# Patient Record
Sex: Female | Born: 1945 | Race: White | Hispanic: No | Marital: Married | Smoking: Former smoker
Health system: Southern US, Community
[De-identification: ages and names within clinical notes are randomized; demographics above are authoritative.]

## PROBLEM LIST (undated history)

## (undated) DIAGNOSIS — G4730 Sleep apnea, unspecified: Secondary | ICD-10-CM

## (undated) DIAGNOSIS — M1990 Unspecified osteoarthritis, unspecified site: Secondary | ICD-10-CM

## (undated) DIAGNOSIS — T8859XA Other complications of anesthesia, initial encounter: Secondary | ICD-10-CM

## (undated) DIAGNOSIS — R519 Headache, unspecified: Secondary | ICD-10-CM

## (undated) DIAGNOSIS — E039 Hypothyroidism, unspecified: Secondary | ICD-10-CM

## (undated) DIAGNOSIS — J329 Chronic sinusitis, unspecified: Secondary | ICD-10-CM

## (undated) DIAGNOSIS — G5603 Carpal tunnel syndrome, bilateral upper limbs: Secondary | ICD-10-CM

## (undated) DIAGNOSIS — M722 Plantar fascial fibromatosis: Secondary | ICD-10-CM

## (undated) DIAGNOSIS — M2170 Unequal limb length (acquired), unspecified site: Secondary | ICD-10-CM

## (undated) DIAGNOSIS — T7840XA Allergy, unspecified, initial encounter: Secondary | ICD-10-CM

## (undated) DIAGNOSIS — E785 Hyperlipidemia, unspecified: Secondary | ICD-10-CM

## (undated) DIAGNOSIS — K5909 Other constipation: Secondary | ICD-10-CM

## (undated) DIAGNOSIS — K219 Gastro-esophageal reflux disease without esophagitis: Secondary | ICD-10-CM

## (undated) DIAGNOSIS — G8929 Other chronic pain: Secondary | ICD-10-CM

## (undated) DIAGNOSIS — C801 Malignant (primary) neoplasm, unspecified: Secondary | ICD-10-CM

## (undated) DIAGNOSIS — F419 Anxiety disorder, unspecified: Secondary | ICD-10-CM

## (undated) DIAGNOSIS — H409 Unspecified glaucoma: Secondary | ICD-10-CM

## (undated) DIAGNOSIS — L718 Other rosacea: Secondary | ICD-10-CM

## (undated) DIAGNOSIS — H269 Unspecified cataract: Secondary | ICD-10-CM

## (undated) DIAGNOSIS — R51 Headache: Secondary | ICD-10-CM

## (undated) DIAGNOSIS — L723 Sebaceous cyst: Secondary | ICD-10-CM

## (undated) DIAGNOSIS — J309 Allergic rhinitis, unspecified: Secondary | ICD-10-CM

## (undated) DIAGNOSIS — E559 Vitamin D deficiency, unspecified: Secondary | ICD-10-CM

## (undated) DIAGNOSIS — IMO0002 Reserved for concepts with insufficient information to code with codable children: Secondary | ICD-10-CM

## (undated) HISTORY — DX: Reserved for concepts with insufficient information to code with codable children: IMO0002

## (undated) HISTORY — DX: Other rosacea: L71.8

## (undated) HISTORY — DX: Gastro-esophageal reflux disease without esophagitis: K21.9

## (undated) HISTORY — DX: Hyperlipidemia, unspecified: E78.5

## (undated) HISTORY — DX: Unspecified cataract: H26.9

## (undated) HISTORY — DX: Allergic rhinitis, unspecified: J30.9

## (undated) HISTORY — DX: Chronic sinusitis, unspecified: J32.9

## (undated) HISTORY — DX: Unspecified glaucoma: H40.9

## (undated) HISTORY — DX: Sebaceous cyst: L72.3

## (undated) HISTORY — DX: Vitamin D deficiency, unspecified: E55.9

## (undated) HISTORY — DX: Plantar fascial fibromatosis: M72.2

## (undated) HISTORY — DX: Allergy, unspecified, initial encounter: T78.40XA

## (undated) HISTORY — DX: Carpal tunnel syndrome, bilateral upper limbs: G56.03

## (undated) HISTORY — DX: Hypothyroidism, unspecified: E03.9

## (undated) HISTORY — PX: TUBAL LIGATION: SHX77

## (undated) HISTORY — PX: BREAST SURGERY: SHX581

## (undated) HISTORY — PX: SEPTOPLASTY: SUR1290

## (undated) HISTORY — DX: Unequal limb length (acquired), unspecified site: M21.70

## (undated) HISTORY — DX: Other constipation: K59.09

## (undated) HISTORY — DX: Sleep apnea, unspecified: G47.30

## (undated) HISTORY — DX: Anxiety disorder, unspecified: F41.9

## (undated) HISTORY — DX: Other chronic pain: G89.29

## (undated) HISTORY — DX: Headache: R51

## (undated) HISTORY — PX: EYE SURGERY: SHX253

## (undated) HISTORY — DX: Headache, unspecified: R51.9

## (undated) HISTORY — PX: GANGLION CYST EXCISION: SHX1691

## (undated) HISTORY — PX: FACIAL COSMETIC SURGERY: SHX629

## (undated) HISTORY — PX: RHINOPLASTY: SUR1284

## (undated) HISTORY — PX: OTHER SURGICAL HISTORY: SHX169

## (undated) HISTORY — PX: MENISCUS REPAIR: SHX5179

---

## 1981-05-24 HISTORY — PX: ABDOMINAL HYSTERECTOMY: SHX81

## 2008-02-12 DIAGNOSIS — IMO0002 Reserved for concepts with insufficient information to code with codable children: Secondary | ICD-10-CM | POA: Insufficient documentation

## 2008-02-12 DIAGNOSIS — E7800 Pure hypercholesterolemia, unspecified: Secondary | ICD-10-CM | POA: Insufficient documentation

## 2008-02-15 DIAGNOSIS — M722 Plantar fascial fibromatosis: Secondary | ICD-10-CM | POA: Insufficient documentation

## 2008-02-15 DIAGNOSIS — M2170 Unequal limb length (acquired), unspecified site: Secondary | ICD-10-CM | POA: Insufficient documentation

## 2012-05-11 LAB — HM MAMMOGRAPHY

## 2012-10-06 ENCOUNTER — Encounter: Payer: Self-pay | Admitting: Pulmonary Disease

## 2012-10-06 ENCOUNTER — Ambulatory Visit (INDEPENDENT_AMBULATORY_CARE_PROVIDER_SITE_OTHER): Payer: Medicare Other | Admitting: Pulmonary Disease

## 2012-10-06 VITALS — BP 132/82 | HR 70 | Temp 97.3°F | Ht 66.5 in | Wt 210.0 lb

## 2012-10-06 DIAGNOSIS — G4733 Obstructive sleep apnea (adult) (pediatric): Secondary | ICD-10-CM | POA: Insufficient documentation

## 2012-10-06 NOTE — Progress Notes (Signed)
Subjective:    Patient ID: Carol Norton, female    DOB: May 05, 1946, 67 y.o.   MRN: [id number]  HPI The patient is a 67 year old female who I've been asked to see for management of obstructive sleep apnea.  She was diagnosed with significant sleep apnea in 2001 while living in [place], and has been on CPAP since that time.  She does not have a medical equipment company here in town, and is currently using a CPAP machine that is 66 years old.  She uses a nasal CPAP mask, but is unsure how old this is.  Overall, the patient feels that she is doing well on the CPAP, and feels rested in the mornings upon arising.  However, she feels that she is a "slow starter", but denies any issues with sleepiness during the day.  She has no issues trying to watch television or movies in the evening, and has no sleepiness with driving.  Her weight has increased about 25 pounds over the last 5 years, and her Epworth score today is 7.   Sleep Questionnaire What time do you typically go to bed?( Between what hours) [id number] 03-1129 at 1419 on 10/06/12 by Nita Sells, CMA How long does it take you to fall asleep? 10-24mins 10-51mins at 1419 on 10/06/12 by Nita Sells, CMA How many times during the night do you wake up? What time do you get out of bed to start your day? [id number] 7-830 at 1419 on 10/06/12 by Marjo Bicker Mabe, CMA Do you drive or operate heavy machinery in your occupation? No No at 1419 on 10/06/12 by Nita Sells, CMA How much has your weight changed (up or down) over the past two years? (In pounds) 25 lb (11.34 kg)25 lb (11.34 kg) increase at 1419 on 10/06/12 by Nita Sells, CMA Have you ever had a sleep study before? Yes Yes at 1419 on 10/06/12 by Nita Sells, CMA If yes, location of [place] at 1419 on 10/06/12 by Nita Sells, CMA If yes, date of study? 2001 2001 at 1419 on 10/06/12 by Nita Sells, CMA Do you currently use CPAP? Yes Yes at 1419 on  10/06/12 by Marjo Bicker Mabe, CMA If so, what pressure? 11/12 11/12 at 1419 on 10/06/12 by Marjo Bicker Mabe, CMA Do you wear oxygen at any time? No No at 1419 on 10/06/12 by Marjo Bicker Mabe, CMA   Review of Systems  Constitutional: Positive for unexpected weight change. Negative for fever.  HENT: Positive for congestion, sneezing and dental problem. Negative for ear pain, nosebleeds, sore throat, rhinorrhea, trouble swallowing, postnasal drip and sinus pressure.   Eyes: Negative for redness and itching.  Respiratory: Negative for cough, chest tightness, shortness of breath and wheezing.   Cardiovascular: Negative for palpitations and leg swelling.  Gastrointestinal: Negative for nausea and vomiting.       Acid heartburn   Genitourinary: Negative for dysuria.  Musculoskeletal: Positive for joint swelling and arthralgias.  Skin: Positive for rash ( itching).  Neurological: Negative for headaches.  Hematological: Does not bruise/bleed easily.  Psychiatric/Behavioral: Negative for dysphoric mood. The patient is not nervous/anxious.        Objective:   Physical Exam Constitutional:  Overweight female, no acute distress  HENT:  Nares patent without discharge, but deviated septum to left with swollen mucosa on left  Oropharynx without exudate, palate and uvula are normal  Eyes:  Perrla, eomi, no scleral icterus  Neck:  No JVD,  no TMG  Cardiovascular:  Normal rate, regular rhythm, no rubs or gallops.  No murmurs        Intact distal pulses  Pulmonary :  Normal breath sounds, no stridor or respiratory distress   No rales, rhonchi, or wheezing  Abdominal:  Soft, nondistended, bowel sounds present.  No tenderness noted.   Musculoskeletal:  No lower extremity edema noted.  Lymph Nodes:  No cervical lymphadenopathy noted  Skin:  No cyanosis noted  Neurologic:  Alert, appropriate, moves all 4 extremities without obvious deficit.         Assessment & Plan:

## 2012-10-06 NOTE — Patient Instructions (Addendum)
Will see if you qualify for a new cpap machine, and will also optimize your pressure on the automatic setting for a few weeks.  Will let you know your optimal pressure once download available. Work on weight loss followup with me in one year if doing well, but call if having problems.

## 2012-10-06 NOTE — Assessment & Plan Note (Signed)
The patient has a history of significant obstructive sleep apnea by her history, and has been on CPAP since 2001.  She has gained significant weight the last 5 years, and has not had her pressure at looked at since 2001.  Despite this, she feels that she sleeps fairly well, and is satisfied with her alertness during the day.  At this point, she is using a CPAP sheen from 2001, and I would like to get her up to date in terms of equipment.  I also think we need to optimize her pressure again since it has been quite some time and she has gained weight.  I have asked her to keep up with her mask changes and supplies, and we'll see her back in one year if doing well.

## 2012-10-26 ENCOUNTER — Telehealth: Payer: Self-pay | Admitting: Pulmonary Disease

## 2012-10-26 NOTE — Telephone Encounter (Signed)
Called, spoke with Selena Batten with [facility].  States pt's current cpap is set at an auto of 5-20.  Her old machine was set at 26.  Kim faxed over 2 wk download today.  States pt feels 5 is too low to start at.  Pt would like to know if this can be increased.  Kim aware KC out of office this evening and will return tomorrow.  KC, pls advise.  Thank you.

## 2012-10-27 ENCOUNTER — Encounter: Payer: Self-pay | Admitting: Pulmonary Disease

## 2012-10-27 ENCOUNTER — Other Ambulatory Visit: Payer: Self-pay | Admitting: Pulmonary Disease

## 2012-10-27 DIAGNOSIS — G4733 Obstructive sleep apnea (adult) (pediatric): Secondary | ICD-10-CM

## 2012-10-27 NOTE — Telephone Encounter (Signed)
I have placed download inside of your Green folder Dr. Shelle Iron. Thanks

## 2012-10-27 NOTE — Telephone Encounter (Signed)
[facility] is closed for the day.  WCB on Monday.

## 2012-10-27 NOTE — Telephone Encounter (Signed)
Please let pt know that her optimal pressure appears to be 13-14cm. Will send an order to DME to change.

## 2012-10-27 NOTE — Telephone Encounter (Signed)
Would like to see the 2 week download.  Where is it?

## 2012-10-30 NOTE — Telephone Encounter (Signed)
Order placed and sent to [facility]. Carron Curie, CMA

## 2013-12-12 ENCOUNTER — Encounter: Payer: Self-pay | Admitting: Internal Medicine

## 2013-12-12 ENCOUNTER — Other Ambulatory Visit: Payer: Self-pay | Admitting: Family Medicine

## 2013-12-12 DIAGNOSIS — M542 Cervicalgia: Secondary | ICD-10-CM

## 2013-12-14 ENCOUNTER — Ambulatory Visit
Admission: RE | Admit: 2013-12-14 | Discharge: 2013-12-14 | Disposition: A | Discharge status: Home / Self Care | Payer: Medicare Other | Source: Ambulatory Visit | Attending: Family Medicine | Admitting: Family Medicine

## 2013-12-14 DIAGNOSIS — M542 Cervicalgia: Secondary | ICD-10-CM

## 2014-02-12 ENCOUNTER — Encounter: Payer: Medicare Other | Admitting: Internal Medicine

## 2014-03-13 ENCOUNTER — Ambulatory Visit (INDEPENDENT_AMBULATORY_CARE_PROVIDER_SITE_OTHER): Payer: Medicare Other | Admitting: Pulmonary Disease

## 2014-03-13 ENCOUNTER — Encounter: Payer: Self-pay | Admitting: Pulmonary Disease

## 2014-03-13 ENCOUNTER — Encounter (INDEPENDENT_AMBULATORY_CARE_PROVIDER_SITE_OTHER): Payer: Self-pay

## 2014-03-13 VITALS — BP 142/60 | HR 84 | Temp 97.0°F | Ht 67.0 in | Wt 215.6 lb

## 2014-03-13 DIAGNOSIS — G4733 Obstructive sleep apnea (adult) (pediatric): Secondary | ICD-10-CM

## 2014-03-13 NOTE — Patient Instructions (Signed)
Continue on cpap, and we will send in an order to [facility] for the new "dreamware mask". Work on weight loss followup with me again in one year, but call if having issues with mask.

## 2014-03-13 NOTE — Progress Notes (Signed)
   Subjective:    Patient ID: Carol Norton, female    DOB: [birth year], 68 y.o.   MRN: [id number]  HPI Patient comes in today for followup of her obstructive sleep apnea. She is wearing CPAP compliantly, but is having issues with her skin being irritated by the CPAP mask. She sleeps well with the device, and has seen significant improvement in her daytime alertness since being on the CPAP.   Review of Systems  Constitutional: Negative for fever and unexpected weight change.  HENT: Negative for congestion, dental problem, ear pain, nosebleeds, postnasal drip, rhinorrhea, sinus pressure, sneezing, sore throat and trouble swallowing.   Eyes: Negative for redness and itching.  Respiratory: Negative for cough, chest tightness, shortness of breath and wheezing.   Cardiovascular: Negative for palpitations and leg swelling.  Gastrointestinal: Negative for nausea and vomiting.  Genitourinary: Negative for dysuria.  Musculoskeletal: Negative for joint swelling.  Skin: Negative for rash.  Neurological: Negative for headaches.  Hematological: Does not bruise/bleed easily.  Psychiatric/Behavioral: Negative for dysphoric mood. The patient is not nervous/anxious.        Objective:   Physical Exam Obese female in no acute distress Nose without purulence or discharge noted No skin breakdown or pressure necrosis from the CPAP mask Mild erythema that is patchy on her cheeks and over the bridge of her nose Neck without lymphadenopathy or thyromegaly Lower extremities with mild edema, no cyanosis Alert and oriented, moves all 4 extremities.       Assessment & Plan:

## 2014-03-13 NOTE — Assessment & Plan Note (Addendum)
The patient is wearing CPAP compliantly, but is having issues with her skin condition and the CPAP mask.  I have showed her a few different types of masks that are new, and she would like to try the dreamware.  This will minimize contact with her skin around her nose.  I have asked her to continue on CPAP, and to work aggressively on weight loss.

## 2014-03-15 ENCOUNTER — Telehealth: Payer: Self-pay | Admitting: Pulmonary Disease

## 2014-03-15 NOTE — Telephone Encounter (Signed)
Called [facility] and received after hrs answering service. I tried calling Carol's # and did not receive an answer. Will need to call back on Monday.  Called pt and made her aware of above. She also reports she has a hard time getting through [place] and keeps getting transferred to the call center.  Pt aware we will call her once we speak with [facility].

## 2014-03-18 NOTE — Telephone Encounter (Signed)
Called and spoke to pt. She stated she never has not received a call from [facility] about her mask. Called and spoke to [place] with [facility]. She stated the order was cancelled. Order reinstated. Lynn Ito stated they will call her to get the mask fitted. Called and spoke to pt. Advised her of the update and stated someone will be contacting her to get mask fitted. Pt verbalized understanding and denied any further questions or concerns at this time.

## 2014-04-01 ENCOUNTER — Telehealth: Payer: Self-pay | Admitting: Pulmonary Disease

## 2014-04-01 DIAGNOSIS — G4733 Obstructive sleep apnea (adult) (pediatric): Secondary | ICD-10-CM

## 2014-04-01 NOTE — Telephone Encounter (Signed)
Called spoke with pt. She had the nasal pillows and it causes too much air in the nose. She has a dreamware mask and it feels like a lot of pressure going up into her nose. She reports her nose is swollen and feels sore on the inside. She wants to get a download off her machine while using the dreamware mask to make sure the pressue is not too much. Huey Romans will need to send a chip to do so. Please advise [place] thanks

## 2014-04-01 NOTE — Telephone Encounter (Signed)
Called [facility]. Pt has an S-9 auto that she was set up in May 2014. She is not on airview Will send order to [facility] for download Called made pt aware of below. Nothing further needed

## 2014-04-01 NOTE — Telephone Encounter (Signed)
If it is a new machine, we should be able to get download from [place].  Please do this for the last 2 weeks.  If the mask is making her nose sore, she will need to try something else.

## 2014-05-06 ENCOUNTER — Telehealth: Payer: Self-pay | Admitting: Pulmonary Disease

## 2014-05-06 DIAGNOSIS — G4733 Obstructive sleep apnea (adult) (pediatric): Secondary | ICD-10-CM

## 2014-05-06 NOTE — Telephone Encounter (Signed)
[facility] PT line rang numerous times, no answer WCB 

## 2014-05-06 NOTE — Telephone Encounter (Signed)
Carol Norton, let pt know that her download shows good control of her sleep apnea, but is having a little more mask leak than she should.  Work with her home care company on a better fit. Thanks.

## 2014-05-08 NOTE — Telephone Encounter (Signed)
Called home #-line rang numerous times and no answer Called mobile #--Lmtcb X1

## 2014-05-09 NOTE — Telephone Encounter (Signed)
I spoke with patient about results and she verbalized understanding. Pt reports she has been trying to work with [facility] and even tried the mask Dr. Gwenette Greet recommended but she has a difficult time d/t her rosacea. Pt has contact issues with certain masks and the silicone. Pt reports she has not been over to the sleep center w/ [facility] yet. Please advise [place] thanks

## 2014-05-09 NOTE — Telephone Encounter (Signed)
Called and spoke to pt. Informed pt of the recs per KC. Order placed. Pt verbalized understanding and denied any further questions or concerns at this time.   

## 2014-05-09 NOTE — Telephone Encounter (Signed)
I think referral to sleep center for mask fitting is a good idea If not able to find anything to work, may have to consider a dental appliance instead of cpap.

## 2014-06-04 ENCOUNTER — Ambulatory Visit (HOSPITAL_BASED_OUTPATIENT_CLINIC_OR_DEPARTMENT_OTHER): Payer: 59 | Attending: Pulmonary Disease | Admitting: Radiology

## 2014-06-04 DIAGNOSIS — G4733 Obstructive sleep apnea (adult) (pediatric): Secondary | ICD-10-CM

## 2014-06-04 DIAGNOSIS — Z9989 Dependence on other enabling machines and devices: Principal | ICD-10-CM

## 2014-07-15 ENCOUNTER — Ambulatory Visit
Admission: RE | Admit: 2014-07-15 | Discharge: 2014-07-15 | Disposition: A | Discharge status: Home / Self Care | Payer: 59 | Source: Ambulatory Visit | Attending: Family Medicine | Admitting: Family Medicine

## 2014-07-15 ENCOUNTER — Other Ambulatory Visit: Payer: Self-pay | Admitting: Family Medicine

## 2014-07-15 DIAGNOSIS — T1490XA Injury, unspecified, initial encounter: Secondary | ICD-10-CM

## 2014-11-12 ENCOUNTER — Telehealth: Payer: Self-pay | Admitting: Pulmonary Disease

## 2014-11-12 NOTE — Telephone Encounter (Signed)
Pt returned call - [phone] (different from previous # left)

## 2014-11-12 NOTE — Telephone Encounter (Signed)
LMTC x 1  

## 2014-11-12 NOTE — Telephone Encounter (Signed)
Spoke with pt and she has talked with [facility] and requested new supplies.  They told her they would ship this out.  I instructed pt if [facility] needed anything else they would contact us.

## 2014-11-19 ENCOUNTER — Other Ambulatory Visit: Payer: Self-pay | Admitting: Family Medicine

## 2014-11-19 ENCOUNTER — Ambulatory Visit
Admission: RE | Admit: 2014-11-19 | Discharge: 2014-11-19 | Disposition: A | Discharge status: Home / Self Care | Payer: Medicare Other | Source: Ambulatory Visit | Attending: Family Medicine | Admitting: Family Medicine

## 2014-11-19 DIAGNOSIS — R05 Cough: Secondary | ICD-10-CM

## 2014-11-19 DIAGNOSIS — R062 Wheezing: Secondary | ICD-10-CM

## 2014-11-19 DIAGNOSIS — R059 Cough, unspecified: Secondary | ICD-10-CM

## 2014-12-17 ENCOUNTER — Encounter: Payer: Self-pay | Admitting: Family Medicine

## 2014-12-17 ENCOUNTER — Ambulatory Visit (INDEPENDENT_AMBULATORY_CARE_PROVIDER_SITE_OTHER): Payer: Medicare Other | Admitting: Family Medicine

## 2014-12-17 VITALS — BP 145/90 | HR 88 | Ht 66.75 in | Wt 210.0 lb

## 2014-12-17 DIAGNOSIS — H409 Unspecified glaucoma: Secondary | ICD-10-CM | POA: Insufficient documentation

## 2014-12-17 DIAGNOSIS — L718 Other rosacea: Secondary | ICD-10-CM

## 2014-12-17 DIAGNOSIS — R252 Cramp and spasm: Secondary | ICD-10-CM | POA: Insufficient documentation

## 2014-12-17 DIAGNOSIS — E039 Hypothyroidism, unspecified: Secondary | ICD-10-CM | POA: Diagnosis not present

## 2014-12-17 DIAGNOSIS — J329 Chronic sinusitis, unspecified: Secondary | ICD-10-CM

## 2014-12-17 DIAGNOSIS — K5900 Constipation, unspecified: Secondary | ICD-10-CM

## 2014-12-17 DIAGNOSIS — K2100 Gastro-esophageal reflux disease with esophagitis, without bleeding: Secondary | ICD-10-CM | POA: Insufficient documentation

## 2014-12-17 HISTORY — DX: Constipation, unspecified: K59.00

## 2014-12-17 LAB — BASIC METABOLIC PANEL WITH GFR
BUN: 17 mg/dL (ref 7–25)
CALCIUM: 9.2 mg/dL (ref 8.6–10.4)
CO2: 24 mEq/L (ref 20–31)
Chloride: 105 mEq/L (ref 98–110)
Creat: 0.86 mg/dL (ref 0.50–0.99)
GFR, EST NON AFRICAN AMERICAN: 70 mL/min (ref >=60)
GFR, Est African American: 80 mL/min (ref >=60)
Glucose, Bld: 107 mg/dL — ABNORMAL HIGH (ref 65–99)
POTASSIUM: 4.4 meq/L (ref 3.5–5.3)
Sodium: 140 mEq/L (ref 135–146)

## 2014-12-17 LAB — T4, FREE: FREE T4: 0.71 ng/dL — AB (ref 0.80–1.80)

## 2014-12-17 LAB — TSH: TSH: 1.231 u[IU]/mL (ref 0.350–4.500)

## 2014-12-17 NOTE — Progress Notes (Signed)
CC: Carol Norton is a 69 y.o. female is here for Establish Care   Subjective: HPI:  Very pleasant 69 year old here to establish care, wife of Carol Norton  Reports a history of obstructive sleep apnea diagnosed in 2001. She currently uses a AutoPap on a nightly basis and states it does a great job of per venting her from having a return of her nonrestorative sleep or napping during the daytime.  Reports a history of hypothyroidism a few years ago she switched from levothyroxine to Armour Thyroid and feels that this has given her more energy compared to what was provided to her by way of levothyroxine. She denies any known side effects. There's been no unintentional weight loss or gain. It's been over 6 months since her thyroid function was checked last.  For matter of decades she's had difficulty fighting sinus infections, postnasal drip and seasonal allergies. Currently she is asymptomatic after taking prednisone late June or early July. She denies any facial pressure or headaches. She's currently seen in ears and throat specialist uses over-the-counter anti-histamines and nasal steroidal sprays  History of gastroesophageal reflux: She is currently taking Prilosec on a daily basis. Provided she takes this every day she denies any epigastric pain or reflux symptoms.  Complains of "cramping of my diaphragm and ribs" that occurs when she strains to have a bowel movement. It's been going on for an unknown amount of time, it is mild in severity, other than defecating nothing seems to make it better or worse. She denies any exertional pain or limb claudication.  Has a history of constipation that she describes as "my colon collapses on itself". If she does not take MiraLAX or Senokot she'll become impacted after a few days of not having a bowel movement. Within the last year she has required digital disimpaction after she strayed from her bowel regimen above. Currently she is just using the Senokot preparation.  She tells me she has 2 problems today without any blood in her stool or change in the caliber of her stool. She denies any abdominal pain or change in appetite  Review of Systems - General ROS: negative for - chills, fever, night sweats, weight gain or weight loss Ophthalmic ROS: negative for - decreased vision Psychological ROS: negative for - anxiety or depression ENT ROS: negative for - hearing change, nasal congestion, tinnitus or allergies Hematological and Lymphatic ROS: negative for - bleeding problems, bruising or swollen lymph nodes Breast ROS: negative Respiratory ROS: no cough, shortness of breath, or wheezing Cardiovascular ROS: no chest pain or dyspnea on exertion Gastrointestinal ROS: no abdominal pain, change in bowel habits, or black or bloody stools Genito-Urinary ROS: negative for - genital discharge, genital ulcers, incontinence or abnormal bleeding from genitals Musculoskeletal ROS: negative for - joint pain or muscle pain Neurological ROS: negative for - headaches or memory loss Dermatological ROS: negative for lumps, mole changes, rash and skin lesion changes  Past Medical History  Diagnosis Date  . Hyperlipemia   . Chronic headache   . Sleep apnea   . Ocular rosacea   . Sleep apnea   . Glaucoma     Past Surgical History  Procedure Laterality Date  . Abdominal hysterectomy    . Tubal ligation    . Breast surgery      sebaceous cyst  . Septoplasty    . Rhinoplasty    . Facial cosmetic surgery      1995  . Meniscus repair     Family History  Problem Relation Age of Onset  . Emphysema Father   . Allergies Mother   . Heart disease Mother   . Ovarian cancer Mother   . Hyperlipidemia Mother     History   Social History  . Marital Status: Married    Spouse Name: N/A  . Number of Children: 2  . Years of Education: N/A   Occupational History  . retired    Social History Main Topics  . Smoking status: Former Smoker -- 0.75 packs/day for 25 years     Types: Cigarettes    Quit date: 09/22/2006  . Smokeless tobacco: Not on file     Comment: quit for 10 years in the total 31yrs.   . Alcohol Use: Yes     Comment: rare  . Drug Use: Not on file  . Sexual Activity: Not on file   Other Topics Concern  . Not on file   Social History Narrative     Objective: BP 145/90 mmHg  Pulse 88  Ht 5' 6.75" (1.695 m)  Wt 210 lb (95.255 kg)  BMI 33.15 kg/m2  General: Alert and Oriented, No Acute Distress HEENT: Pupils equal, round, reactive to light. Conjunctivae clear.  Moist mucous membranes Lungs: Clear to auscultation bilaterally, no wheezing/ronchi/rales.  Comfortable work of breathing. Good air movement. Cardiac: Regular rate and rhythm. Normal S1/S2.  No murmurs, rubs, nor gallops.   Abdomen: Mild obesity Extremities: No peripheral edema.  Strong peripheral pulses.  Mental Status: No depression, anxiety, nor agitation. Skin: Warm and dry.  Assessment & Plan: Carol Norton was seen today for establish care.  Diagnoses and all orders for this visit:  Hypothyroidism, unspecified hypothyroidism type Orders: -     TSH -     T4, free  Glaucoma  Chronic sinusitis, unspecified location  Gastroesophageal reflux disease with esophagitis  Constipation, unspecified constipation type  Muscle cramping Orders: -     BASIC METABOLIC PANEL WITH GFR  Ocular rosacea   Hypothyroidism: Clinically controlled due for TSH and free T4, continue Armour Thyroid pending results Glaucoma and ocular rosacea to be managed by ophthalmology Chronic sinusitis: Controlled at this time, Agree with anti-histamine and nasal saline wash, will think about single her in the future if symptoms return GERD: Controlled with Prilosec Constipation: Controlled with Senokot preparation, continue to use daily Muscle cramping: Checking electrolytes, if normal will advise over-the-counter magnesium   Return in about 3 months (around 03/19/2015).

## 2014-12-18 ENCOUNTER — Telehealth: Payer: Self-pay | Admitting: Family Medicine

## 2014-12-18 DIAGNOSIS — R739 Hyperglycemia, unspecified: Secondary | ICD-10-CM | POA: Insufficient documentation

## 2014-12-18 MED ORDER — THYROID 65 MG PO TABS
65.0000 mg | ORAL_TABLET | Freq: Every day | ORAL | Status: DC
Start: 2014-12-18 — End: 2015-01-28

## 2014-12-18 NOTE — Telephone Encounter (Signed)
Left message on vm with results  

## 2014-12-18 NOTE — Telephone Encounter (Signed)
Seth Bake, Will you please let patient know that her thyroid tests would suggest that her armour thyroid is under-dosed therefore I've [facility] a [facility] at a dose of 65mg  daily. We'll want to recheck this in three months.  Also her blood sugar was just barely elevated and I'd recommend she have a three month average blood sugar checked.  If the lab is unable to add this on a lab slip is in your inbox.

## 2015-01-28 ENCOUNTER — Encounter: Payer: Self-pay | Admitting: Family Medicine

## 2015-01-28 ENCOUNTER — Ambulatory Visit (INDEPENDENT_AMBULATORY_CARE_PROVIDER_SITE_OTHER): Payer: Medicare Other | Admitting: Family Medicine

## 2015-01-28 VITALS — BP 152/80 | HR 87 | Wt 209.0 lb

## 2015-01-28 DIAGNOSIS — K5900 Constipation, unspecified: Secondary | ICD-10-CM | POA: Diagnosis not present

## 2015-01-28 DIAGNOSIS — R739 Hyperglycemia, unspecified: Secondary | ICD-10-CM | POA: Diagnosis not present

## 2015-01-28 DIAGNOSIS — E039 Hypothyroidism, unspecified: Secondary | ICD-10-CM

## 2015-01-28 MED ORDER — THYROID 90 MG PO TABS: 90.0000 mg | ORAL_TABLET | Freq: Every day | ORAL | Status: DC

## 2015-01-28 NOTE — Progress Notes (Signed)
CC: Carol Norton is a 70 y.o. female is here for f/u labs   Subjective: HPI:  Follow-up hypothyroidism: Taking 60 mg of Armour Thyroid daily. T4 was below normal when checked at the end of July. She never got the message on her voicemail that her Armour Thyroid regimen needed to be increased. Her pharmacist told her the other day that there is no such thing as a 65 mg formulation of Armour Thyroid anymore. Review of systems is positive for occasional fatigue, constipation occasional cramping and hair falling out. She denies diarrhea, abdominal pain or mental disturbance.  Follow constipation: She is having a bowel movement every day provided she takes senna. No abdominal discomfort. No pain with defecation   follow-up hyperglycemia: has yet to have an A1c checked. I pointed out that her sugar was mildly elevated at her last visit she systolic, to have blood sugars ranging from 100-110. No polyuria palpitation or polydipsia     Review Of Systems Outlined In HPI  Past Medical History  Diagnosis Date  . Hyperlipemia   . Chronic headache   . Sleep apnea   . Ocular rosacea   . Sleep apnea   . Glaucoma     Past Surgical History  Procedure Laterality Date  . Abdominal hysterectomy    . Tubal ligation    . Breast surgery      sebaceous cyst  . Septoplasty    . Rhinoplasty    . Facial cosmetic surgery      1995  . Meniscus repair     Family History  Problem Relation Age of Onset  . Emphysema Father   . Allergies Mother   . Heart disease Mother   . Ovarian cancer Mother   . Hyperlipidemia Mother     Social History   Social History  . Marital Status: Married    Spouse Name: N/A  . Number of Children: 2  . Years of Education: N/A   Occupational History  . retired    Social History Main Topics  . Smoking status: Former Smoker -- 0.75 packs/day for 25 years    Types: Cigarettes    Quit date: 09/22/2006  . Smokeless tobacco: Not on file     Comment: quit for 10 years in  the total 25yrs.   . Alcohol Use: Yes     Comment: rare  . Drug Use: Not on file  . Sexual Activity: Not on file   Other Topics Concern  . Not on file   Social History Narrative     Objective: BP 152/80 mmHg  Pulse 87  Wt 209 lb (94.802 kg)  Vital signs reviewed. General: Alert and Oriented, No Acute Distress HEENT: Pupils equal, round, reactive to light. Conjunctivae clear.  External ears unremarkable.  Moist mucous membranes. Lungs: Clear and comfortable work of breathing, speaking in full sentences without accessory muscle use. Cardiac: Regular rate and rhythm.  Neuro: CN II-XII grossly intact, gait normal. Extremities: No peripheral edema.  Strong peripheral pulses.  Mental Status: No depression, anxiety, nor agitation. Logical though process. Skin: Warm and dry. Assessment & Plan: Carol Norton was seen today for f/u labs.  Diagnoses and all orders for this visit:  Constipation, unspecified constipation type  Hypothyroidism, unspecified hypothyroidism type  Hyperglycemia  Other orders -     thyroid (ARMOUR THYROID) 90 MG tablet; Take 1 tablet (90 mg total) by mouth daily.   Hyperglycemia: Needing to get an A1c today, further plans will be based on this result.  constipation: Controlled with senna however I hope this will improve once her thyroid function is optimized Hypothyroidism: Symptomatic with a insufficient T4 therefore increasing Armour Thyroid return in one month to recheck numbers  Return in about 4 weeks (around 02/25/2015) for Thyroid.

## 2015-01-29 LAB — POCT GLYCOSYLATED HEMOGLOBIN (HGB A1C): HEMOGLOBIN A1C: 6

## 2015-01-29 NOTE — Addendum Note (Signed)
Addended by: Terance Hart on: 01/29/2015 09:23 AM   Modules accepted: Orders

## 2015-01-31 ENCOUNTER — Telehealth: Payer: Self-pay | Admitting: Family Medicine

## 2015-01-31 NOTE — Telephone Encounter (Signed)
Received fax for prior authorization on Armour Thyroid sent through cover my meds waiting on authorization. - CF

## 2015-02-03 NOTE — Telephone Encounter (Signed)
Message left on vm 

## 2015-02-03 NOTE — Telephone Encounter (Signed)
Seth Bake, Will you please let patient know that [employer] [facility] has decided not to cover her Armour Thyroid.  I'm willing to write an appeal on this, can she please provide as much info as possible regarding her experience with either levothyroxine or synthroid?

## 2015-02-05 NOTE — Telephone Encounter (Signed)
Called and left another message on vm

## 2015-02-05 NOTE — Telephone Encounter (Signed)
Pt called and left a message that she had no explanation other than she " did better on the armour than on levothyroxine." she states to hold of on the appeal for now. She will pay out of pocket as she has been doing she will talk more with at her appt on 10/4 because she still has so many questions about thyroid disease. We changed her 15 appt slot on 10/4 to a 30 min slot

## 2015-02-20 ENCOUNTER — Encounter: Payer: Self-pay | Admitting: Family Medicine

## 2015-02-20 DIAGNOSIS — M4802 Spinal stenosis, cervical region: Secondary | ICD-10-CM | POA: Insufficient documentation

## 2015-02-20 DIAGNOSIS — J45909 Unspecified asthma, uncomplicated: Secondary | ICD-10-CM | POA: Insufficient documentation

## 2015-02-25 ENCOUNTER — Ambulatory Visit (INDEPENDENT_AMBULATORY_CARE_PROVIDER_SITE_OTHER): Payer: Medicare Other | Admitting: Family Medicine

## 2015-02-25 ENCOUNTER — Encounter: Payer: Self-pay | Admitting: Family Medicine

## 2015-02-25 VITALS — BP 145/76 | HR 75 | Wt 209.0 lb

## 2015-02-25 DIAGNOSIS — E559 Vitamin D deficiency, unspecified: Secondary | ICD-10-CM | POA: Insufficient documentation

## 2015-02-25 DIAGNOSIS — E039 Hypothyroidism, unspecified: Secondary | ICD-10-CM

## 2015-02-25 DIAGNOSIS — M1990 Unspecified osteoarthritis, unspecified site: Secondary | ICD-10-CM | POA: Diagnosis not present

## 2015-02-25 DIAGNOSIS — G5603 Carpal tunnel syndrome, bilateral upper limbs: Secondary | ICD-10-CM

## 2015-02-25 LAB — T4, FREE: FREE T4: 0.85 ng/dL (ref 0.80–1.80)

## 2015-02-25 LAB — TSH: TSH: 0.143 u[IU]/mL — AB (ref 0.350–4.500)

## 2015-02-25 MED ORDER — ACYCLOVIR 5 % EX OINT: 1.0000 "application " | TOPICAL_OINTMENT | CUTANEOUS | Status: DC | PRN

## 2015-02-25 MED ORDER — ALPRAZOLAM 0.5 MG PO TABS: 0.5000 mg | ORAL_TABLET | Freq: Every evening | ORAL | Status: DC | PRN

## 2015-02-25 MED ORDER — CELECOXIB 200 MG PO CAPS: 200.0000 mg | ORAL_CAPSULE | Freq: Two times a day (BID) | ORAL | Status: DC | PRN

## 2015-02-25 NOTE — Progress Notes (Signed)
CC: Carol Norton is a 69 y.o. female is here for f/u thyroid   Subjective: HPI:  Hypothyroidism: Since increasing Armour thyroid she has not noticed any benefit or decline in overall symptoms. She reports that she is not experiencing as much energy as she is used to. She feels sluggish in the morning. She denies any known side effects since starting the higher dose.  Fatigue hasn't present for matter of months now. Mild in severity but noticed by her husband as well. No intervention other than changing Armour thyroid. Denies nonrestorative sleep, snoring or witnessed apneic episodes. She does have a history of vitamin D deficiency currently not taking any supplements.  Long-standing history of  Arthritis involving the hands, wrists, knees, shoulders. Symptoms have been slowly getting worse over the past few weeks. They're described as a stiffness and improved with activity. Seems to be worse after periods of inactivity. There's been no swelling redness or warmth of any of these joints. Denies recent or remote trauma. She is also been using a splint for both wrists at night to help with numbness that involves the palmar surface of the thumb index and middle finger. Symptoms overall are moderate in severity and joint pain is improved with meloxicam however she still feels like there is room for improvement.  Follow-up insomnia: Requesting refills on Xanax. She uses this on a nightly basis and it helps get rid of her difficulty falling asleep.  Review Of Systems Outlined In HPI  Past Medical History  Diagnosis Date  . Hyperlipemia   . Chronic headache   . Sleep apnea   . Ocular rosacea   . Sleep apnea   . Glaucoma     Past Surgical History  Procedure Laterality Date  . Abdominal hysterectomy    . Tubal ligation    . Breast surgery      sebaceous cyst  . Septoplasty    . Rhinoplasty    . Facial cosmetic surgery      1995  . Meniscus repair Left    Family History  Problem Relation Age  of Onset  . Emphysema Father   . Allergies Mother   . Heart disease Mother   . Ovarian cancer Mother   . Hyperlipidemia Mother     Social History   Social History  . Marital Status: Married    Spouse Name: N/A  . Number of Children: 2  . Years of Education: N/A   Occupational History  . retired    Social History Main Topics  . Smoking status: Former Smoker -- 0.75 packs/day for 25 years    Types: Cigarettes    Quit date: 09/22/2006  . Smokeless tobacco: Not on file     Comment: quit for 10 years in the total 49yrs.   . Alcohol Use: Yes     Comment: rare  . Drug Use: Not on file  . Sexual Activity: Not on file   Other Topics Concern  . Not on file   Social History Narrative     Objective: BP 145/76 mmHg  Pulse 75  Wt 209 lb (94.802 kg)  General: Alert and Oriented, No Acute Distress HEENT: Pupils equal, round, reactive to light. Conjunctivae clear, moist mucous membrane Lungs: Clear and comfortable work of breathing  Cardiac: Regular rate and rhythm.  Extremities: No peripheral edema.  Strong peripheral pulses.  full range of motion and strength of both hands, Tinel's positive bilaterally, no swelling redness or warmth in any joints involving the wrists or  hands.  Mental Status: No depression, anxiety, nor agitation. Skin: Warm and dry.  Assessment & Plan: Carol Norton was seen today for f/u thyroid.  Diagnoses and all orders for this visit:  Hypothyroidism, unspecified hypothyroidism type -     TSH -     T4, free  Vitamin D deficiency -     Vit D  25 hydroxy (rtn osteoporosis monitoring)  Osteoarthritis, unspecified osteoarthritis type, unspecified site -     ALPRAZolam (XANAX) 0.5 MG tablet; Take 1 tablet (0.5 mg total) by mouth at bedtime as needed for sleep. -     celecoxib (CELEBREX) 200 MG capsule; Take 1 capsule (200 mg total) by mouth 2 (two) times daily as needed.  Bilateral carpal tunnel syndrome  Other orders -     acyclovir ointment (ZOVIRAX) 5 %;  Apply 1 application topically every 3 (three) hours as needed. Fever blisters.   hypothyroidism: Due for repeat T4 and TSH continue Armour Thyroid pending results   vitamin D deficiency: High suspicion for return of her deficiency therefore rechecking by me D level  Osteoarthritis: Uncontrolled chronic condition start Celebrex daily in place of meloxicam Bilateral carpal tunnel syndrome: She is overusing splinting and has tried stretches, I have referred her to our sports medicine clinic for a second opinion on management. Insomnia: Controlled continue as needed use of nightly Xanax.  She is requesting a refill on acyclovir ointment for cold sores. Currently a stenotic.    Return in about 3 months (around 05/28/2015).

## 2015-02-26 ENCOUNTER — Ambulatory Visit (INDEPENDENT_AMBULATORY_CARE_PROVIDER_SITE_OTHER): Payer: Medicare Other

## 2015-02-26 ENCOUNTER — Encounter: Payer: Self-pay | Admitting: Family Medicine

## 2015-02-26 ENCOUNTER — Telehealth: Payer: Self-pay | Admitting: Family Medicine

## 2015-02-26 ENCOUNTER — Ambulatory Visit (INDEPENDENT_AMBULATORY_CARE_PROVIDER_SITE_OTHER): Payer: Medicare Other | Admitting: Family Medicine

## 2015-02-26 VITALS — BP 139/85 | HR 73 | Wt 209.0 lb

## 2015-02-26 DIAGNOSIS — M25531 Pain in right wrist: Secondary | ICD-10-CM

## 2015-02-26 DIAGNOSIS — M25532 Pain in left wrist: Secondary | ICD-10-CM | POA: Diagnosis not present

## 2015-02-26 DIAGNOSIS — G5603 Carpal tunnel syndrome, bilateral upper limbs: Secondary | ICD-10-CM | POA: Diagnosis not present

## 2015-02-26 LAB — VITAMIN D 25 HYDROXY (VIT D DEFICIENCY, FRACTURES): Vit D, 25-Hydroxy: 22 ng/mL — ABNORMAL LOW (ref 30–100)

## 2015-02-26 MED ORDER — THYROID 90 MG PO TABS: ORAL_TABLET | ORAL | Status: DC

## 2015-02-26 MED ORDER — VITAMIN D (ERGOCALCIFEROL) 1.25 MG (50000 UNIT) PO CAPS: 50000.0000 [IU] | ORAL_CAPSULE | ORAL | Status: DC

## 2015-02-26 MED ORDER — THYROID 60 MG PO TABS: ORAL_TABLET | ORAL | Status: DC

## 2015-02-26 NOTE — Assessment & Plan Note (Signed)
Status post hydrodissection of the right carpal tunnel median nerve. Return in a few weeks for injection of the left nerve and recheck of the right. Recommend wrist brace for the next few days.

## 2015-02-26 NOTE — Progress Notes (Signed)
Subjective:    I'm seeing this patient as a consultation for:  Dr. Ileene Rubens  CC: BL wrist pain and numbness.   HPI: Patient has ongoing chronic hand numbness and wrist pain. She notes right worse than left numbness and tingling into her first 3 digits of both hands. She states this is worse at night especially if she does not wear her braces. At some point she was given soft wrist braces which seemed to help a lot especially for nighttime symptoms. She denies any injury or formal workup for this problem. Additionally she notes a several year history of diffuse wrist pain especially worse when she pushes off with her wrists extended. Right again is worse than left. No recent injury. She knows over year-long history were she had some neck symptoms and had arm weakness bilaterally. This lasted a few minutes. She had MRI of her C-spine did not show severe impingement.  Past medical history, Surgical history, Family history not pertinant except as noted below, Social history, Allergies, and medications have been entered into the medical record, reviewed, and no changes needed.   Review of Systems: No headache, visual changes, nausea, vomiting, diarrhea, constipation, dizziness, abdominal pain, skin rash, fevers, chills, night sweats, weight loss, swollen lymph nodes, body aches, joint swelling, muscle aches, chest pain, shortness of breath, mood changes, visual or auditory hallucinations.   Objective:    Filed Vitals:   02/26/15 1008  BP: 139/85  Pulse: 73   General: Well Developed, well nourished, and in no acute distress.  Neuro/Psych: Alert and oriented x3, extra-ocular muscles intact, able to move all 4 extremities, sensation grossly intact. Skin: Warm and dry, no rashes noted.  Respiratory: Not using accessory muscles, speaking in full sentences, trachea midline.  Cardiovascular: Pulses palpable, no extremity edema. Abdomen: Does not appear distended. MSK: Wrists bilaterally are relatively  normal appearing. Mild synovitis palpated bilaterally. Normal range of motion. No thenar or hyperthenar atrophy. Sensation is slightly decreased to the right index finger but otherwise normal bilaterally. Normal motion capillary refill and pulses. Negative Tinel's test bilaterally.  Diagnostic ultrasound of the right wrist shows an enlarged median nerve and the carpal tunnel with a cross-sectional area of 12 mm. No other abnormalities noted. Normal bones.   Procedure: Real-time Ultrasound Guided right median nerve hydrodissection at the carpal tunnel Device: GE Logiq E  Images permanently stored and available for review in the ultrasound unit. Verbal informed consent obtained. Discussed risks and benefits of procedure. Warned about infection bleeding damage to structures skin hypopigmentation and fat atrophy among others. Patient expresses understanding and agreement Time-out conducted.  Noted no overlying erythema, induration, or other signs of local infection.  Skin prepped in a sterile fashion.  Local anesthesia: Topical Ethyl chloride.  With sterile technique and under real time ultrasound guidance: The median nerve was visualized. From the ulnar approach the needle was advanced nearing the nerve. Fluid was injected achieving good hydrodissection of the median nerve. A total of 4 mL of lidocaine, and 40 mg of Kenalog were injected easily.  Completed without difficulty  Advised to call if fevers/chills, erythema, induration, drainage, or persistent bleeding.  Images permanently stored and available for review in the ultrasound unit.  Impression: Technically successful ultrasound guided injection.    Results for orders placed or performed in visit on 02/25/15 (from the past 24 hour(s))  TSH     Status: Abnormal   Collection Time: 02/25/15 11:25 AM  Result Value Ref Range   TSH 0.143 (L)  0.350 - 4.500 uIU/mL   Narrative   Performed at:  [facility], [address]                [place], [place] [address] SOURCE: BLOOD&BLOOD; SOURCE: BLOOD&BLOOD; SOURCE: BLOOD&BLOO  T4, free     Status: None   Collection Time: 02/25/15 11:25 AM  Result Value Ref Range   Free T4 0.85 0.80 - 1.80 ng/dL   Narrative   Performed at:  [facility]                [place], [place] [address]  Vit D  25 hydroxy (rtn osteoporosis monitoring)     Status: Abnormal   Collection Time: 02/25/15 11:25 AM  Result Value Ref Range   Vit D, 25-Hydroxy 22 (L) 30 - 100 ng/mL   Narrative   Performed at:  [facility]                [place], [place] [address]   No results found.  Impression and Recommendations:   This case required medical decision making of moderate complexity.

## 2015-02-26 NOTE — Patient Instructions (Signed)
Thank you for coming in today. Return in 2-3 weeks for recheck.  Take it easy.  Call or go to the ER if you develop a large red swollen joint with extreme pain or oozing puss.   Carpal Tunnel Syndrome Carpal tunnel syndrome is a condition that causes pain in your hand and arm. The carpal tunnel is a narrow area located on the palm side of your wrist. Repeated wrist motion or certain diseases may cause swelling within the tunnel. This swelling pinches the main nerve in the wrist (median nerve). CAUSES  This condition may be caused by:   Repeated wrist motions.  Wrist injuries.  Arthritis.  A cyst or tumor in the carpal tunnel.  Fluid buildup during pregnancy. Sometimes the cause of this condition is not known.  RISK FACTORS This condition is more likely to develop in:   People who have jobs that cause them to repeatedly move their wrists in the same motion, such as butchers and cashiers.  Women.  People with certain conditions, such as:  Diabetes.  Obesity.  An underactive thyroid (hypothyroidism).  Kidney failure. SYMPTOMS  Symptoms of this condition include:   A tingling feeling in your fingers, especially in your thumb, index, and middle fingers.  Tingling or numbness in your hand.  An aching feeling in your entire arm, especially when your wrist and elbow are bent for long periods of time.  Wrist pain that goes up your arm to your shoulder.  Pain that goes down into your palm or fingers.  A weak feeling in your hands. You may have trouble grabbing and holding items. Your symptoms may feel worse during the night.  DIAGNOSIS  This condition is diagnosed with a medical history and physical exam. You may also have tests, including:   An electromyogram (EMG). This test measures electrical signals sent by your nerves into the muscles.  X-rays. TREATMENT  Treatment for this condition includes:  Lifestyle changes. It is important to stop doing or modify the  activity that caused your condition.  Physical or occupational therapy.  Medicines for pain and inflammation. This may include medicine that is injected into your wrist.  A wrist splint.  Surgery. HOME CARE INSTRUCTIONS  If You Have a Splint:  Wear it as told by your health care provider. Remove it only as told by your health care provider.  Loosen the splint if your fingers become numb and tingle, or if they turn cold and blue.  Keep the splint clean and dry. General Instructions  Take over-the-counter and prescription medicines only as told by your health care provider.  Rest your wrist from any activity that may be causing your pain. If your condition is work related, talk to your employer about changes that can be made, such as getting a wrist pad to use while typing.  If directed, apply ice to the painful area:  Put ice in a plastic bag.  Place a towel between your skin and the bag.  Leave the ice on for 20 minutes, 2-3 times per day.  Keep all follow-up visits as told by your health care provider. This is important.  Do any exercises as told by your health care provider, physical therapist, or occupational therapist. [place] IF:   You have new symptoms.  Your pain is not controlled with medicines.  Your symptoms get worse.   This information is not intended to replace advice given to you by your health care provider. Make sure you discuss any  questions you have with your health care provider.   Document Released: 05/07/2000 Document Revised: 01/29/2015 Document Reviewed: 09/25/2014 Elsevier Interactive Patient Education [employer].

## 2015-02-26 NOTE — Assessment & Plan Note (Signed)
Likely due to DJD. Bilateral wrist x-rays pending.

## 2015-02-26 NOTE — Telephone Encounter (Signed)
Seth Bake, Will you please let patient know that her vitamin d level was deficient to a level that I suspect this is heavily playing into her fatigue.  Also her thyroid supplement appears to be too high of a dose.  I'd recommend starting a weekly vitamin d supplement and begin alternating between 90mg  and 60mg  of armour thyroid every other day. I sent Rxs of these to [facility] [place]. F/U two months to recheck thyroid function.

## 2015-02-26 NOTE — Progress Notes (Signed)
Quick Note:  Patient has arthritis of both wrists. ______

## 2015-02-27 NOTE — Telephone Encounter (Signed)
Pt notified and has multiple questions some of which I cannot answer. I released results to mychart per her request and after she reviews the numbers she may have an idea of what to ask. She says she may schedule an appt

## 2015-03-18 ENCOUNTER — Ambulatory Visit: Payer: Medicare Other | Admitting: Family Medicine

## 2015-03-20 ENCOUNTER — Ambulatory Visit (INDEPENDENT_AMBULATORY_CARE_PROVIDER_SITE_OTHER): Payer: Medicare Other | Admitting: Family Medicine

## 2015-03-20 ENCOUNTER — Encounter: Payer: Self-pay | Admitting: Family Medicine

## 2015-03-20 VITALS — BP 124/81 | HR 82 | Wt 205.0 lb

## 2015-03-20 DIAGNOSIS — R29898 Other symptoms and signs involving the musculoskeletal system: Secondary | ICD-10-CM

## 2015-03-20 DIAGNOSIS — M79644 Pain in right finger(s): Secondary | ICD-10-CM | POA: Diagnosis not present

## 2015-03-20 DIAGNOSIS — G5603 Carpal tunnel syndrome, bilateral upper limbs: Secondary | ICD-10-CM

## 2015-03-20 LAB — C-REACTIVE PROTEIN: CRP: 1.2 mg/dL — AB (ref <0.60)

## 2015-03-20 NOTE — Assessment & Plan Note (Signed)
Right index finger DIP injected. Return in a few weeks.

## 2015-03-20 NOTE — Patient Instructions (Signed)
Thank you for coming in today. Call or go to the ER if you develop a large red swollen joint with extreme pain or oozing puss.  Use the wrist brace for a few days.   Carpal Tunnel Syndrome Carpal tunnel syndrome is a condition that causes pain in your hand and arm. The carpal tunnel is a narrow area located on the palm side of your wrist. Repeated wrist motion or certain diseases may cause swelling within the tunnel. This swelling pinches the main nerve in the wrist (median nerve). CAUSES  This condition may be caused by:   Repeated wrist motions.  Wrist injuries.  Arthritis.  A cyst or tumor in the carpal tunnel.  Fluid buildup during pregnancy. Sometimes the cause of this condition is not known.  RISK FACTORS This condition is more likely to develop in:   People who have jobs that cause them to repeatedly move their wrists in the same motion, such as butchers and cashiers.  Women.  People with certain conditions, such as:  Diabetes.  Obesity.  An underactive thyroid (hypothyroidism).  Kidney failure. SYMPTOMS  Symptoms of this condition include:   A tingling feeling in your fingers, especially in your thumb, index, and middle fingers.  Tingling or numbness in your hand.  An aching feeling in your entire arm, especially when your wrist and elbow are bent for long periods of time.  Wrist pain that goes up your arm to your shoulder.  Pain that goes down into your palm or fingers.  A weak feeling in your hands. You may have trouble grabbing and holding items. Your symptoms may feel worse during the night.  DIAGNOSIS  This condition is diagnosed with a medical history and physical exam. You may also have tests, including:   An electromyogram (EMG). This test measures electrical signals sent by your nerves into the muscles.  X-rays. TREATMENT  Treatment for this condition includes:  Lifestyle changes. It is important to stop doing or modify the activity that  caused your condition.  Physical or occupational therapy.  Medicines for pain and inflammation. This may include medicine that is injected into your wrist.  A wrist splint.  Surgery. HOME CARE INSTRUCTIONS  If You Have a Splint:  Wear it as told by your health care provider. Remove it only as told by your health care provider.  Loosen the splint if your fingers become numb and tingle, or if they turn cold and blue.  Keep the splint clean and dry. General Instructions  Take over-the-counter and prescription medicines only as told by your health care provider.  Rest your wrist from any activity that may be causing your pain. If your condition is work related, talk to your employer about changes that can be made, such as getting a wrist pad to use while typing.  If directed, apply ice to the painful area:  Put ice in a plastic bag.  Place a towel between your skin and the bag.  Leave the ice on for 20 minutes, 2-3 times per day.  Keep all follow-up visits as told by your health care provider. This is important.  Do any exercises as told by your health care provider, physical therapist, or occupational therapist. [place] IF:   You have new symptoms.  Your pain is not controlled with medicines.  Your symptoms get worse.   This information is not intended to replace advice given to you by your health care provider. Make sure you discuss any questions you  have with your health care provider.   Document Released: 05/07/2000 Document Revised: 01/29/2015 Document Reviewed: 09/25/2014 Elsevier Interactive Patient Education [employer].

## 2015-03-20 NOTE — Progress Notes (Signed)
Carol Norton is a 69 y.o. female who presents to [facility]  today for follow-up right carpal tunnel syndrome and address left carpal tunnel syndrome and right index finger arthritis.  1) right carpal tunnel: Patient received a ultrasound-guided hydrodissection of the median nerve in the right carpal tunnel on October 5. She's feeling much better with only residual symptoms.  2) left carpal tunnel: Patient notes continued numbness and tingling into the first 3 digits of her left hand. She has tried conservative management including bracing. She is interested in left carpal tunnel injection if possible.  3) right index finger DIP pain. Patient notes persistent pain in her right index finger. She's been diagnosed with DJD of that joint in the past. She is interested in an injection of possible as she has tried other less conservative measures and not had persistent relief.   Past Medical History  Diagnosis Date  . Hyperlipemia   . Chronic headache   . Sleep apnea   . Ocular rosacea   . Sleep apnea   . Glaucoma    Past Surgical History  Procedure Laterality Date  . Abdominal hysterectomy    . Tubal ligation    . Breast surgery      sebaceous cyst  . Septoplasty    . Rhinoplasty    . Facial cosmetic surgery      1995  . Meniscus repair Left    Social History  Substance Use Topics  . Smoking status: Former Smoker -- 0.75 packs/day for 25 years    Types: Cigarettes    Quit date: 09/22/2006  . Smokeless tobacco: Not on file     Comment: quit for 10 years in the total 79yrs.   . Alcohol Use: Yes     Comment: rare   family history includes Allergies in her mother; Emphysema in her father; Heart disease in her mother; Hyperlipidemia in her mother; Ovarian cancer in her mother.  ROS as above Medications: Current Outpatient Prescriptions  Medication Sig Dispense Refill  . acyclovir ointment (ZOVIRAX) 5 % Apply 1 application topically every 3  (three) hours as needed. Fever blisters. 15 g 2  . ALPRAZolam (XANAX) 0.5 MG tablet Take 1 tablet (0.5 mg total) by mouth at bedtime as needed for sleep. 30 tablet 2  . BIOTIN PO Take 1 capsule by mouth daily. Plus folic acid    . doxycycline (VIBRA-TABS) 100 MG tablet Take 0.5 tablets (50 mg total) by mouth 2 (two) times daily. 20 tablet 0  . fexofenadine (ALLEGRA) 180 MG tablet Take 180 mg by mouth as needed.     . meloxicam (MOBIC) 15 MG tablet Take 15 mg by mouth as needed.     . mometasone (NASONEX) 50 MCG/ACT nasal spray Place 2 sprays into the nose as needed.     Marland Kitchen oxymetazoline (AFRIN) 0.05 % nasal spray Place 2 sprays into the nose 2 (two) times daily as needed.     . Polyethylene Glycol 400 (BLINK TEARS OP) Apply to eye at bedtime as needed.    . thyroid (ARMOUR THYROID) 60 MG tablet One by mouth alternating between 90mg  and 60mg  every other day. 30 tablet 3  . thyroid (ARMOUR THYROID) 90 MG tablet One by mouth alternating between 90mg  and 60mg  every other day. 30 tablet 3  . Vitamin D, Ergocalciferol, (DRISDOL) 50000 UNITS CAPS capsule Take 1 capsule (50,000 Units total) by mouth every 7 (seven) days. Recheck Vitamin D in 3 Months 12 capsule 0  No current facility-administered medications for this visit.   Allergies  Allergen Reactions  . Avelox [Moxifloxacin Hcl In Nacl]   . Citalopram   . Crestor [Rosuvastatin]   . Dexilant [Dexlansoprazole]   . Escitalopram Oxalate   . Statins     Myalgias, joint pain.  . Wellbutrin [Bupropion]      Exam:  BP 124/81 mmHg  Pulse 82  Wt 205 lb (92.987 kg) Gen: Well NAD Left wrist normal-appearing no significant thenar atrophy. Normal hand motion. Positive Tinel's test of the left carpal tunnel. Right hand: Swollen tender with some ulnar deviation left second digit DIP joint. Pain with motion.  Procedure: Real-time Ultrasound Guided hydrodissection of the left median nerve  Device: GE Logiq E  Images permanently stored and available  for review in the ultrasound unit. Verbal informed consent obtained. Discussed risks and benefits of procedure. Warned about infection bleeding damage to structures skin hypopigmentation and fat atrophy among others. Patient expresses understanding and agreement Time-out conducted.  Noted no overlying erythema, induration, or other signs of local infection.  Skin prepped in a sterile fashion.  Local anesthesia: Topical Ethyl chloride.  With sterile technique and under real time ultrasound guidance:A 25-gauge 1-1/2 inch needle was advanced into the carpal tunnel. Under careful ultrasound guidance fluid was injected superficial and deep to the nerve free nerve from adhesions. A total of 40 mg of Kenalog and 3 mL of lidocaine without epinephrine were injected Completed without difficulty   Advised to call if fevers/chills, erythema, induration, drainage, or persistent bleeding.  Images permanently stored and available for review in the ultrasound unit.  Impression: Technically successful ultrasound guided injection.  Procedure: Real-time Ultrasound Guided Injection of right second DIP joint  Device: GE Logiq E  Images permanently stored and available for review in the ultrasound unit. Verbal informed consent obtained. Discussed risks and benefits of procedure. Warned about infection bleeding damage to structures skin hypopigmentation and fat atrophy among others. Patient expresses understanding and agreement Time-out conducted.  Noted no overlying erythema, induration, or other signs of local infection.  Skin prepped in a sterile fashion.  Local anesthesia: Topical Ethyl chloride.  With sterile technique and under real time ultrasound guidance: 0.2 mL of an 80 mg per 1 mL Depo-Medrol, 0.1 mL of lidocaine were injected. The volume 0.3 mL. Following removal of the needle a small amount of the fluid leaked out of the skin.  Completed without difficulty  Pain immediately resolved  suggesting accurate placement of the medication.  Advised to call if fevers/chills, erythema, induration, drainage, or persistent bleeding.  Images permanently stored and available for review in the ultrasound unit.  Impression: Technically successful ultrasound guided injection.      No results found for this or any previous visit (from the past 24 hour(s)). No results found.   Please see individual assessment and plan sections.

## 2015-03-20 NOTE — Progress Notes (Signed)
CC: Carol Norton is a 69 y.o. female is here for Extremity Weakness   Subjective: HPI:  Complains of bilateral shoulder weakness and heaviness that has been present for matter of months. It comes and goes on a daily basis. It's described as moderate in severity. It's worse with any motion above the head. She had a CT scan showing nerve root impingement in the middle C-spine one year ago and has been treating the symptoms to these findings. She denies any recent or remote pain or any sensory disturbances in the upper extremities nor the shoulders other than carpal tunnel syndrome symptoms that are slowly improving with the help of our sports medicine team. On direct questioning she reports that she does feel like her legs are weak proximally at times. Interestingly she swims for 20 minutes 3 times a week without any weakness. Denies unintentional weight loss or gain. Denies fevers, chills or headaches.   Review Of Systems Outlined In HPI  Past Medical History  Diagnosis Date  . Hyperlipemia   . Chronic headache   . Sleep apnea   . Ocular rosacea   . Sleep apnea   . Glaucoma     Past Surgical History  Procedure Laterality Date  . Abdominal hysterectomy    . Tubal ligation    . Breast surgery      sebaceous cyst  . Septoplasty    . Rhinoplasty    . Facial cosmetic surgery      1995  . Meniscus repair Left    Family History  Problem Relation Age of Onset  . Emphysema Father   . Allergies Mother   . Heart disease Mother   . Ovarian cancer Mother   . Hyperlipidemia Mother     Social History   Social History  . Marital Status: Married    Spouse Name: N/A  . Number of Children: 2  . Years of Education: N/A   Occupational History  . retired    Social History Main Topics  . Smoking status: Former Smoker -- 0.75 packs/day for 25 years    Types: Cigarettes    Quit date: 09/22/2006  . Smokeless tobacco: Not on file     Comment: quit for 10 years in the total 87yr.   .  Alcohol Use: Yes     Comment: rare  . Drug Use: Not on file  . Sexual Activity: Not on file   Other Topics Concern  . Not on file   Social History Narrative     Objective: BP 124/81 mmHg  Pulse 82  Wt 205 lb (92.987 kg)  Vital signs reviewed. General: Alert and Oriented, No Acute Distress HEENT: Pupils equal, round, reactive to light. Conjunctivae clear.  External ears unremarkable.  Moist mucous membranes. Lungs: Clear and comfortable work of breathing, speaking in full sentences without accessory muscle use. Cardiac: Regular rate and rhythm.  Neuro: CN II-XII grossly intact, gait normal. Extremities: No peripheral edema.  Strong peripheral pulses.full range of motion and strength in all 4 extremities  Mental Status: No depression, anxiety, nor agitation. Logical though process. Skin: Warm and dry.  Assessment & Plan: JThedawas seen today for extremity weakness.  Diagnoses and all orders for this visit:  Bilateral arm weakness -     Sed Rate (ESR) -     C-reactive protein   Bilateral arm weakness: Rule out polymyalgia rheumatica, if this is normal. Her that she'll need to be referred to neurology and I'll be happy to help with  this.  Return if symptoms worsen or fail to improve.

## 2015-03-20 NOTE — Assessment & Plan Note (Signed)
Status post median nerve hydrodissection left side. Plan for bracing. Follow-up in a few weeks.

## 2015-03-21 ENCOUNTER — Telehealth: Payer: Self-pay | Admitting: Family Medicine

## 2015-03-21 DIAGNOSIS — R29898 Other symptoms and signs involving the musculoskeletal system: Secondary | ICD-10-CM

## 2015-03-21 LAB — SEDIMENTATION RATE: SED RATE: 6 mm/h (ref 0–30)

## 2015-03-21 NOTE — Telephone Encounter (Signed)
Pt advised.

## 2015-03-21 NOTE — Telephone Encounter (Signed)
Please let patient know that her blood work has ruled out polymyalgia rheumatica which is the condition I talked to her about on Thursday.  Although this is reassuring I still don't know that is causing her arm weakness so I have placed a neurology referral.

## 2015-03-25 ENCOUNTER — Ambulatory Visit (INDEPENDENT_AMBULATORY_CARE_PROVIDER_SITE_OTHER): Payer: Medicare Other | Admitting: Neurology

## 2015-03-25 ENCOUNTER — Encounter: Payer: Self-pay | Admitting: Neurology

## 2015-03-25 VITALS — BP 156/92 | HR 88 | Ht 67.0 in | Wt 206.0 lb

## 2015-03-25 DIAGNOSIS — M6281 Muscle weakness (generalized): Secondary | ICD-10-CM | POA: Diagnosis not present

## 2015-03-25 DIAGNOSIS — I480 Paroxysmal atrial fibrillation: Secondary | ICD-10-CM

## 2015-03-25 NOTE — Patient Instructions (Signed)
    We will check blood work today, and get a 30 day heart monitor.

## 2015-03-25 NOTE — Progress Notes (Signed)
Reason for visit: Transient arm weakness  Referring physician: Dr. [name] is a 69 y.o. female  History of present illness:  Carol Norton is a 69 year old right-handed white female with a history of transient episodes of arm heaviness that began about 4-6 weeks prior to this evaluation. She goes on to say that in July 2015, she had a very severe episode of paralysis of the arms that lasted several minutes, then resolved. The patient underwent MRI evaluation of the cervical spine shortly thereafter, but no evidence of spinal cord compression or severe spinal stenosis was noted. The patient has done well until just recently. The patient has had multiple episodes of heaviness sensations in the arms, the episodes may last several hours then resolve. The patient also notes some tingling sensations in the throat at time, unrelated to the episodes of arm heaviness. The patient reports that she does have episodes of palpitations of the heart, slight shortness of breath that may correlate with the arm heaviness. She indicates that she can be quite physically active, swimming up to 27 laps in the swimming pool at times, she indicates that there is no discomfort down the arms or legs, she has not had any episodes of transient heaviness of the legs. She denies issues controlling the bowels or the bladder, but she does have chronic constipation. 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 She has been followed for hypothyroidism. She comes to this office for an evaluation.  Past Medical History  Diagnosis Date  . Hyperlipemia   . Chronic headache   . Sleep apnea   . Ocular rosacea   . Sleep apnea   . Glaucoma   . Hypothyroid   . Glaucoma   . Ocular rosacea   . Anxiety     Past Surgical History  Procedure Laterality Date  . Abdominal hysterectomy    . Tubal ligation    . Breast surgery      sebaceous cyst  . Septoplasty    . Rhinoplasty    . Facial  cosmetic surgery      1995  . Meniscus repair Left   . Ganglion cyst excision      Family History  Problem Relation Age of Onset  . Emphysema Father   . Allergies Mother   . Heart disease Mother   . Ovarian cancer Mother   . Hyperlipidemia Mother   . Hypothyroidism Mother   . Hypothyroidism Sister     Social history:  reports that she quit smoking about 7 years ago. Her smoking use included Cigarettes. She has a 18.75 pack-year smoking history. She has never used smokeless tobacco. She reports that she does not drink alcohol or use illicit drugs.  Medications:  Prior to Admission medications   Medication Sig Start Date End Date Taking? Authorizing Provider  acyclovir ointment (ZOVIRAX) 5 % Apply 1 application topically every 3 (three) hours as needed. Fever blisters. 02/25/15  Yes Sean Hommel, DO  ALPRAZolam (XANAX) 0.5 MG tablet Take 1 tablet (0.5 mg total) by mouth at bedtime as needed for sleep. 02/25/15  Yes Sean Hommel, DO  aspirin 81 MG tablet Take 81 mg by mouth daily.   Yes Historical Provider, MD  BIOTIN PO Take 1 capsule by mouth daily. Plus folic acid   Yes Historical Provider, MD  doxycycline (VIBRA-TABS) 100 MG tablet Take 0.5 tablets (50 mg total) by mouth 2 (two) times daily. 12/17/14  Yes Marcial Pacas, DO  fexofenadine (ALLEGRA) 180 MG tablet Take 180 mg by mouth as needed.    Yes Historical Provider, MD  meloxicam (MOBIC) 15 MG tablet Take 15 mg by mouth as needed.    Yes Historical Provider, MD  Methylsulfonylmethane (MSM PO) Take 1 tablet by mouth daily.   Yes Historical Provider, MD  mometasone (NASONEX) 50 MCG/ACT nasal spray Place 2 sprays into the nose as needed.    Yes Historical Provider, MD  OVER THE COUNTER MEDICATION Stool softner   Yes Historical Provider, MD  oxymetazoline (AFRIN) 0.05 % nasal spray Place 2 sprays into the nose 2 (two) times daily as needed.    Yes Historical Provider, MD  Polyethylene Glycol 400 (BLINK TEARS OP) Apply to eye at bedtime as  needed.   Yes Historical Provider, MD  Senna-Psyllium (PERDIEM PO) Take 3 tablets by mouth at bedtime.   Yes Historical Provider, MD  thyroid (ARMOUR THYROID) 60 MG tablet One by mouth alternating between 90mg  and 60mg  every other day. 02/26/15  Yes Sean Hommel, DO  thyroid (ARMOUR THYROID) 90 MG tablet One by mouth alternating between 90mg  and 60mg  every other day. 02/26/15  Yes Sean Hommel, DO  Vitamin D, Ergocalciferol, (DRISDOL) 50000 UNITS CAPS capsule Take 1 capsule (50,000 Units total) by mouth every 7 (seven) days. Recheck Vitamin D in 3 Months 02/26/15  Yes Marcial Pacas, DO      Allergies  Allergen Reactions  . Avelox [Moxifloxacin Hcl In Nacl]   . Citalopram   . Crestor [Rosuvastatin]   . Dexilant [Dexlansoprazole]   . Escitalopram Oxalate   . Statins     Myalgias, joint pain.  . Wellbutrin [Bupropion]     ROS:  Out of a complete 14 system review of symptoms, the patient complains only of the following symptoms, and all other reviewed systems are negative.  Weight gain, fatigue Palpitations of the heart Ringing in the ears, difficulty swallowing Snoring Incontinence of the bowel, constipation Incontinence of urine feeling hot Joint pain, muscle cramps Allergies, runny nose Headache, weakness, difficulty swallowing Depression, anxiety, too much sleep, decreased energy Snoring, on CPAP   Blood pressure 156/92, pulse 88, height 5\' 7"  (1.702 m), weight 206 lb (93.441 kg).  Physical Exam  General: The patient is alert and cooperative at the time of the examination.  Eyes: Pupils are equal, round, and reactive to light. Discs are flat bilaterally.  Neck: The neck is supple, no carotid bruits are noted.  Respiratory: The respiratory examination is clear.  Cardiovascular: The cardiovascular examination reveals a regular rate and rhythm, no obvious murmurs or rubs are noted.  Skin: Extremities are without significant edema.  Neurologic Exam  Mental status: The  patient is alert and oriented x 3 at the time of the examination. The patient has apparent normal recent and remote memory, with an apparently normal attention span and concentration ability.  Cranial nerves: Facial symmetry is present. There is good sensation of the face to pinprick and soft touch bilaterally. The strength of the facial muscles and the muscles to head turning and shoulder shrug are normal bilaterally. Speech is well enunciated, no aphasia or dysarthria is noted. Extraocular movements are full. Visual fields are full. The tongue is midline, and the patient has symmetric elevation of the soft palate. No obvious hearing deficits are noted.  Motor: The motor testing reveals 5 over 5 strength of all 4 extremities. Good symmetric motor tone is noted throughout.  Sensory: Sensory testing is intact to pinprick, soft touch, vibration sensation,  and position sense on all 4 extremities. No evidence of extinction is noted.  Coordination: Cerebellar testing reveals good finger-nose-finger and heel-to-shin bilaterally.  Gait and station: Gait is normal. Tandem gait is normal. Romberg is negative. No drift is seen.  Reflexes: Deep tendon reflexes are symmetric and normal bilaterally. Toes are downgoing bilaterally.   MRI cervical 12/14/13:  IMPRESSION: 1. 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 2. The osteophytes slightly compress the ventral aspect of the spinal cord at C4-5 with no myelopathy. Cervical spinal cord is otherwise normal.   Assessment/Plan:  1. Transient episodes of arm heaviness   2. Hypothyroidism  The patient reports episodes of heaviness of both arms, the legs are unaffected. The patient reports that she may have palpitations and some slight shortness of breath with these events. The patient will be sent for a 30 day cardiac monitor to exclude the possibility of an underlying cardiac rhythm such as atrial  fibrillation. The patient will be sent for some blood work today. EMG and nerve conduction study are likely to be of low yield. The patient is completely normal between the events. The patient will follow-up as needed, I will contact her concerning the results of the above studies. The possibility of spinal cord ischemia does need to be considered, but I think this is less likely.   Jill Alexanders MD 03/25/2015 6:50 PM  [facility] [address] [place],  [address]  Phone [phone] Fax [phone]

## 2015-03-27 ENCOUNTER — Ambulatory Visit (INDEPENDENT_AMBULATORY_CARE_PROVIDER_SITE_OTHER): Payer: Medicare Other

## 2015-03-27 DIAGNOSIS — M6281 Muscle weakness (generalized): Secondary | ICD-10-CM | POA: Diagnosis not present

## 2015-03-27 DIAGNOSIS — I480 Paroxysmal atrial fibrillation: Secondary | ICD-10-CM | POA: Diagnosis not present

## 2015-03-27 LAB — VGCC ANTIBODY: VGCC Antibody: NEGATIVE

## 2015-03-27 LAB — CK: CK TOTAL: 250 U/L — AB (ref 24–173)

## 2015-03-27 LAB — ACETYLCHOLINE RECEPTOR, BINDING: ACHR BINDING AB, SERUM: 0.04 nmol/L (ref 0.00–0.24)

## 2015-04-08 ENCOUNTER — Telehealth: Payer: Self-pay | Admitting: Family Medicine

## 2015-04-08 DIAGNOSIS — E039 Hypothyroidism, unspecified: Secondary | ICD-10-CM

## 2015-04-08 NOTE — Telephone Encounter (Signed)
Pt would like a blood test for her thyroid sent so she can get it done on Thursday when in the building for another appt.

## 2015-04-09 NOTE — Telephone Encounter (Signed)
[place], Lab slips in your in box ready for pickup.

## 2015-04-09 NOTE — Telephone Encounter (Signed)
Pt advised.

## 2015-04-10 ENCOUNTER — Ambulatory Visit (INDEPENDENT_AMBULATORY_CARE_PROVIDER_SITE_OTHER): Payer: Medicare Other | Admitting: Family Medicine

## 2015-04-10 ENCOUNTER — Encounter: Payer: Self-pay | Admitting: Family Medicine

## 2015-04-10 ENCOUNTER — Ambulatory Visit: Payer: Medicare Other | Admitting: Family Medicine

## 2015-04-10 VITALS — BP 145/65 | HR 75 | Wt 205.0 lb

## 2015-04-10 DIAGNOSIS — G5603 Carpal tunnel syndrome, bilateral upper limbs: Secondary | ICD-10-CM | POA: Diagnosis not present

## 2015-04-10 NOTE — Assessment & Plan Note (Signed)
Improved following injection. Watchful waiting.

## 2015-04-10 NOTE — Progress Notes (Signed)
Carol Norton is a 69 y.o. female who presents to [facility]  today for bilateral carpal tunnel syndrome and right index finger DIP pain. Patient has been seen several times now and has had bilateral hydrodissection of the median nerves at the carpal tunnel and steroid injection of the right index finger DIP. She feels almost completely better. She notes some continued mild tingling in the first and second digits of both hands. Otherwise she feels quite well.   Past Medical History  Diagnosis Date  . Hyperlipemia   . Chronic headache   . Sleep apnea   . Ocular rosacea   . Sleep apnea   . Glaucoma   . Hypothyroid   . Glaucoma   . Ocular rosacea   . Anxiety    Past Surgical History  Procedure Laterality Date  . Abdominal hysterectomy    . Tubal ligation    . Breast surgery      sebaceous cyst  . Septoplasty    . Rhinoplasty    . Facial cosmetic surgery      1995  . Meniscus repair Left   . Ganglion cyst excision     Social History  Substance Use Topics  . Smoking status: Former Smoker -- 0.75 packs/day for 25 years    Types: Cigarettes    Quit date: 05/25/2007  . Smokeless tobacco: Never Used     Comment: quit for 10 years in the total 53yrs.   . Alcohol Use: No     Comment: rare   family history includes Allergies in her mother; Emphysema in her father; Heart disease in her mother; Hyperlipidemia in her mother; Hypothyroidism in her mother and sister; Ovarian cancer in her mother.  ROS as above Medications: Current Outpatient Prescriptions  Medication Sig Dispense Refill  . acyclovir ointment (ZOVIRAX) 5 % Apply 1 application topically every 3 (three) hours as needed. Fever blisters. 15 g 2  . ALPRAZolam (XANAX) 0.5 MG tablet Take 1 tablet (0.5 mg total) by mouth at bedtime as needed for sleep. 30 tablet 2  . aspirin 81 MG tablet Take 81 mg by mouth daily.    Marland Kitchen BIOTIN PO Take 1 capsule by mouth daily. Plus folic acid    . doxycycline  (VIBRA-TABS) 100 MG tablet Take 0.5 tablets (50 mg total) by mouth 2 (two) times daily. 20 tablet 0  . fexofenadine (ALLEGRA) 180 MG tablet Take 180 mg by mouth as needed.     . meloxicam (MOBIC) 15 MG tablet Take 15 mg by mouth as needed.     . Methylsulfonylmethane (MSM PO) Take 1 tablet by mouth daily.    . mometasone (NASONEX) 50 MCG/ACT nasal spray Place 2 sprays into the nose as needed.     Marland Kitchen OVER THE COUNTER MEDICATION Stool softner    . oxymetazoline (AFRIN) 0.05 % nasal spray Place 2 sprays into the nose 2 (two) times daily as needed.     . Polyethylene Glycol 400 (BLINK TEARS OP) Apply to eye at bedtime as needed.    . Senna-Psyllium (PERDIEM PO) Take 3 tablets by mouth at bedtime.    Marland Kitchen thyroid (ARMOUR THYROID) 60 MG tablet One by mouth alternating between 90mg  and 60mg  every other day. 30 tablet 3  . thyroid (ARMOUR THYROID) 90 MG tablet One by mouth alternating between 90mg  and 60mg  every other day. 30 tablet 3  . Vitamin D, Ergocalciferol, (DRISDOL) 50000 UNITS CAPS capsule Take 1 capsule (50,000 Units total) by mouth  every 7 (seven) days. Recheck Vitamin D in 3 Months 12 capsule 0   No current facility-administered medications for this visit.   Allergies  Allergen Reactions  . Avelox [Moxifloxacin Hcl In Nacl]   . Citalopram   . Crestor [Rosuvastatin]   . Dexilant [Dexlansoprazole]   . Escitalopram Oxalate   . Statins     Myalgias, joint pain.  . Wellbutrin [Bupropion]      Exam:  BP 145/65 mmHg  Pulse 75  Wt 205 lb (92.987 kg) Gen: Well NAD Grip strength. Pulses capillary refill and sensation intact bilaterally.  No results found for this or any previous visit (from the past 24 hour(s)). No results found.   Please see individual assessment and plan sections.

## 2015-04-11 ENCOUNTER — Telehealth: Payer: Self-pay | Admitting: Family Medicine

## 2015-04-11 LAB — T4, FREE: Free T4: 0.97 ng/dL (ref 0.80–1.80)

## 2015-04-11 LAB — TSH: TSH: 0.22 u[IU]/mL — AB (ref 0.350–4.500)

## 2015-04-11 MED ORDER — THYROID 60 MG PO TABS: ORAL_TABLET | ORAL | Status: DC

## 2015-04-11 NOTE — Telephone Encounter (Signed)
Pt advised.

## 2015-04-11 NOTE — Telephone Encounter (Signed)
Will you please let patient know that her thyroid supplement appears to be again too high of a dose. I recommend going to only 60 mg of Armour Thyroid daily. A new prescription was sent to her pharmacy, I recommend repeating his labs in 2-3 months.

## 2015-04-30 ENCOUNTER — Ambulatory Visit (INDEPENDENT_AMBULATORY_CARE_PROVIDER_SITE_OTHER): Payer: Medicare Other | Admitting: Family Medicine

## 2015-04-30 ENCOUNTER — Encounter: Payer: Self-pay | Admitting: Family Medicine

## 2015-04-30 VITALS — BP 122/73 | HR 83 | Wt 203.0 lb

## 2015-04-30 DIAGNOSIS — E039 Hypothyroidism, unspecified: Secondary | ICD-10-CM | POA: Diagnosis not present

## 2015-04-30 DIAGNOSIS — Z639 Problem related to primary support group, unspecified: Secondary | ICD-10-CM

## 2015-04-30 DIAGNOSIS — Z638 Other specified problems related to primary support group: Secondary | ICD-10-CM

## 2015-04-30 NOTE — Progress Notes (Signed)
CC: Carol Norton is a 69 y.o. female is here for Advice Only   Subjective: HPI:  Follow-up hypothyroidism: She denies any return of her fatigue. She denies any known side effects from Armour Thyroid 60 mg daily. She denies any hair or skin complaints. Her oncologist has recommended that she visit with an endocrinologist for further management of her hypothyroidism. Patient would like to know this is okay today.  Her major complaint today is stress that is occurring from trying to take care of a defined grandchild. Her daughter's daughter comes from a dysfunctional family where the father is no longer in the picture. The child is defiant and pushes back against authority. There is trouble at home with respect to chores and homework and there's been a lot of verbal altercations but no physical altercation. Thinking about the stress brings her to tears.   Review Of Systems Outlined In HPI  Past Medical History  Diagnosis Date  . Hyperlipemia   . Chronic headache   . Sleep apnea   . Ocular rosacea   . Sleep apnea   . Glaucoma   . Hypothyroid   . Glaucoma   . Ocular rosacea   . Anxiety     Past Surgical History  Procedure Laterality Date  . Abdominal hysterectomy    . Tubal ligation    . Breast surgery      sebaceous cyst  . Septoplasty    . Rhinoplasty    . Facial cosmetic surgery      1995  . Meniscus repair Left   . Ganglion cyst excision     Family History  Problem Relation Age of Onset  . Emphysema Father   . Allergies Mother   . Heart disease Mother   . Ovarian cancer Mother   . Hyperlipidemia Mother   . Hypothyroidism Mother   . Hypothyroidism Sister     Social History   Social History  . Marital Status: Married    Spouse Name: N/A  . Number of Children: 2  . Years of Education: 13   Occupational History  . retired    Social History Main Topics  . Smoking status: Former Smoker -- 0.75 packs/day for 25 years    Types: Cigarettes    Quit date: 05/25/2007   . Smokeless tobacco: Never Used     Comment: quit for 10 years in the total 65yrs.   . Alcohol Use: No     Comment: rare  . Drug Use: No  . Sexual Activity: Not on file   Other Topics Concern  . Not on file   Social History Narrative   Patient has recently quit drinking caffeine.   Patient is right handed.      Objective: BP 122/73 mmHg  Pulse 83  Wt 203 lb (92.08 kg)  Vital signs reviewed. General: Alert and Oriented, No Acute Distress HEENT: Pupils equal, round, reactive to light. Conjunctivae clear.  External ears unremarkable.  Moist mucous membranes. Lungs: Clear and comfortable work of breathing, speaking in full sentences without accessory muscle use. Cardiac: Regular rate and rhythm.  Neuro: CN II-XII grossly intact, gait normal. Extremities: No peripheral edema.  Strong peripheral pulses.  Mental Status: mildly depressed with tearfulness. no anxiety, nor agitation. Logical though process. Skin: Warm and dry.  Assessment & Plan: Carol Norton was seen today for advice only.  Diagnoses and all orders for this visit:  Hypothyroidism, unspecified hypothyroidism type  Family conflict   Hypothyroidism: I let her know that it seems  completely reasonable for her to want to go see an endocrinologist, my feelings about her, I will still be able to provide herrefills if needed until she is able to establish with him or her. Family conflict discussed that a SNRI may be of some benefit in this situation to help with the patient's anxiety, also I would be more than happy to help arrange family counseling however the grandchild would have to be fully invested in this for this intervention to be of some benefit. Patient will think it over with her family.  25 minutes spent face-to-face during visit today of which at least 50% was counseling or coordinating care regarding: 1. Hypothyroidism, unspecified hypothyroidism type   2. Family conflict      Return if symptoms worsen or fail  to improve.

## 2015-05-07 ENCOUNTER — Telehealth: Payer: Self-pay

## 2015-05-07 NOTE — Telephone Encounter (Signed)
I called [facility] and left a voicemail with medical records (that is the only voicemail I could get and could not get an operator on the phone). I asked for a return call.

## 2015-05-08 NOTE — Telephone Encounter (Signed)
I called [facility] and left a voicemail with medical records. I asked for a return call.

## 2015-05-13 ENCOUNTER — Encounter: Payer: Self-pay | Admitting: Family Medicine

## 2015-05-13 ENCOUNTER — Ambulatory Visit (INDEPENDENT_AMBULATORY_CARE_PROVIDER_SITE_OTHER): Payer: Medicare Other | Admitting: Family Medicine

## 2015-05-13 VITALS — BP 148/83 | HR 97 | Wt 203.0 lb

## 2015-05-13 DIAGNOSIS — Z01818 Encounter for other preprocedural examination: Secondary | ICD-10-CM | POA: Diagnosis not present

## 2015-05-13 DIAGNOSIS — E039 Hypothyroidism, unspecified: Secondary | ICD-10-CM | POA: Diagnosis not present

## 2015-05-13 DIAGNOSIS — F411 Generalized anxiety disorder: Secondary | ICD-10-CM | POA: Diagnosis not present

## 2015-05-13 MED ORDER — ALPRAZOLAM 0.5 MG PO TABS: 0.5000 mg | ORAL_TABLET | Freq: Every evening | ORAL | Status: DC | PRN

## 2015-05-13 NOTE — Progress Notes (Signed)
CC: Carol Norton is a 69 y.o. female is here for Medical Clearance   Subjective: HPI:  In the middle of February she'll be getting a facelift procedure done in [place]. Her plastic surgeon there has asked for a letter of medical clearance, an EKG and a thyroid function test. She denies any exertional chest pain and can exercise with swimming without any shortness of breath. She denies any irregular heartbeat. Denies any difficulty with bruising or bleeding.  She tells me 2 weeks ago she had her TSH checked and it was 0.5. She needs to have this done again per Dr. Bridgett Larsson who will be doing her facelift.   Review Of Systems Outlined In HPI  Past Medical History  Diagnosis Date  . Hyperlipemia   . Chronic headache   . Sleep apnea   . Ocular rosacea   . Sleep apnea   . Glaucoma   . Hypothyroid   . Glaucoma   . Ocular rosacea   . Anxiety     Past Surgical History  Procedure Laterality Date  . Abdominal hysterectomy    . Tubal ligation    . Breast surgery      sebaceous cyst  . Septoplasty    . Rhinoplasty    . Facial cosmetic surgery      1995  . Meniscus repair Left   . Ganglion cyst excision     Family History  Problem Relation Age of Onset  . Emphysema Father   . Allergies Mother   . Heart disease Mother   . Ovarian cancer Mother   . Hyperlipidemia Mother   . Hypothyroidism Mother   . Hypothyroidism Sister     Social History   Social History  . Marital Status: Married    Spouse Name: N/A  . Number of Children: 2  . Years of Education: 13   Occupational History  . retired    Social History Main Topics  . Smoking status: Former Smoker -- 0.75 packs/day for 25 years    Types: Cigarettes    Quit date: 05/25/2007  . Smokeless tobacco: Never Used     Comment: quit for 10 years in the total 67yrs.   . Alcohol Use: No     Comment: rare  . Drug Use: No  . Sexual Activity: Not on file   Other Topics Concern  . Not on file   Social History Narrative   Patient  has recently quit drinking caffeine.   Patient is right handed.      Objective: BP 148/83 mmHg  Pulse 97  Wt 203 lb (92.08 kg)  Vital signs reviewed. General: Alert and Oriented, No Acute Distress HEENT: Pupils equal, round, reactive to light. Conjunctivae clear.  External ears unremarkable.  Moist mucous membranes. Lungs: Clear and comfortable work of breathing, speaking in full sentences without accessory muscle use. Cardiac: Regular rate and rhythm.  Neuro: CN II-XII grossly intact, gait normal. Extremities: No peripheral edema.  Strong peripheral pulses.  Mental Status: No depression, anxiety, nor agitation. Logical though process. Skin: Warm and dry.  Assessment & Plan: Doreene was seen today for medical clearance.  Diagnoses and all orders for this visit:  Hypothyroidism, unspecified hypothyroidism type -     Ambulatory referral to Endocrinology -     TSH  Generalized anxiety disorder -     ALPRAZolam (XANAX) 0.5 MG tablet; Take 1 tablet (0.5 mg total) by mouth at bedtime as needed for sleep.   EKG was obtained showing Normal sinus rhythm,  normal axis, normal intervals, no pathologic Q waves, no ST segment elevation or depression. Provided her TSH is not significantly abnormal I see no reason why she should not be able to tolerate the facelift planned for February.   Return if symptoms worsen or fail to improve.

## 2015-05-14 ENCOUNTER — Telehealth: Payer: Self-pay | Admitting: Neurology

## 2015-05-14 ENCOUNTER — Encounter: Payer: Self-pay | Admitting: Family Medicine

## 2015-05-14 LAB — TSH: TSH: 1.09 u[IU]/mL (ref 0.350–4.500)

## 2015-05-14 NOTE — Telephone Encounter (Signed)
I have called patient. The cardiac monitor study preliminary report indicates no atrial fibrillation, the patient had 8 episodes where she felt that there were palpitations of the heart, but no cardiac rhythm abnormalities were noted. The patient believes that she is feeling better as her thyroid is being better regulated. My nurse has called the cardiology office to get the formal report, they have not yet called back. I discussed the preliminary report with the patient.

## 2015-05-14 NOTE — Telephone Encounter (Signed)
I called [place] again and spoke to [facility]. She transferred me to [facility], who works with the 30 day monitors. I left a voicemail with Lollie Marrow asking for a returned call regarding the patient's final results.

## 2015-05-15 NOTE — Progress Notes (Signed)
Pt.notified

## 2015-05-27 ENCOUNTER — Telehealth: Payer: Self-pay

## 2015-05-27 DIAGNOSIS — E559 Vitamin D deficiency, unspecified: Secondary | ICD-10-CM

## 2015-05-27 NOTE — Telephone Encounter (Signed)
Yes, order in your in box.

## 2015-05-27 NOTE — Telephone Encounter (Signed)
Pt.notified

## 2015-05-27 NOTE — Telephone Encounter (Signed)
Pt would like to know if its ok for her to just get her vit d drawn without having a office visit?

## 2015-05-28 ENCOUNTER — Telehealth: Payer: Self-pay | Admitting: Neurology

## 2015-05-28 ENCOUNTER — Telehealth: Payer: Self-pay | Admitting: Internal Medicine

## 2015-05-28 NOTE — Telephone Encounter (Signed)
Spoke with Charisse Klinefelter and let her know Dr Rayann Heman read this morning and result is in system.

## 2015-05-28 NOTE — Telephone Encounter (Signed)
Sinus rhythm is predominant rhythm "fluttering in chest" corresponds to PACs and nonsustained SVT (Likely atrial tachycardia) I do not see any sustained atrial fibrillation    Signed    Electronically signed by [name], MD on 05/28/15 at 0910 EST

## 2015-05-28 NOTE — Telephone Encounter (Signed)
Carol Norton called me from [facility]. She stated the final results are in. I checked to make sure we can see them on our end and we can.

## 2015-05-28 NOTE — Telephone Encounter (Signed)
I called [place] again. I spoke to [employer]. She stated that Lollie Marrow was with patients right now, but that Lollie Marrow told her the results are in [place]. I explained that the preliminary results are in Epic, but the final results are not. I asked that she please have Lollie Marrow call me back.

## 2015-05-28 NOTE — Telephone Encounter (Signed)
The results of the prolonged cardiac monitor finally available, did not show atrial fibrillation.   Sinus rhythm is predominant rhythm "fluttering in chest" corresponds to PACs and nonsustained SVT (Likely atrial tachycardia) I do not see any sustained atrial fibrillation

## 2015-05-28 NOTE — Telephone Encounter (Signed)
New problem   Calling for results of pt's monitor. Per Darrick Penna stated to send you a message because Dr Rayann Heman need to sign off on results. Dr Jannifer Franklin need results today. Please call.

## 2015-05-29 ENCOUNTER — Telehealth: Payer: Self-pay | Admitting: Family Medicine

## 2015-05-29 LAB — VITAMIN D 25 HYDROXY (VIT D DEFICIENCY, FRACTURES): VIT D 25 HYDROXY: 29 ng/mL — AB (ref 30–100)

## 2015-05-29 MED ORDER — VITAMIN D (ERGOCALCIFEROL) 1.25 MG (50000 UNIT) PO CAPS: 50000.0000 [IU] | ORAL_CAPSULE | ORAL | Status: DC

## 2015-05-29 NOTE — Telephone Encounter (Signed)
Vm left to return call

## 2015-05-29 NOTE — Telephone Encounter (Signed)
Pt notified.  Pt would like to know when would be a good time to get her thyroid levels checked?  She believes that the vitamin D is affecting her tyroid in some way.

## 2015-05-29 NOTE — Telephone Encounter (Signed)
Will you please let patient know that her Vitamin D level has improved but is still one point away from being normal so I'd recommend repeating another three months of a weekly supplement [facility].

## 2015-05-30 NOTE — Telephone Encounter (Signed)
Please let patient know that I don't know of any link with a low Vitamin D level affecting her thyroid function.  Regardless she'll be due to have her TSH checked in March.

## 2015-05-30 NOTE — Telephone Encounter (Signed)
Pt.notified

## 2015-07-16 ENCOUNTER — Other Ambulatory Visit: Payer: Self-pay | Admitting: Family Medicine

## 2015-08-12 ENCOUNTER — Telehealth: Payer: Self-pay | Admitting: Family Medicine

## 2015-08-12 DIAGNOSIS — R6883 Chills (without fever): Secondary | ICD-10-CM

## 2015-08-12 NOTE — Telephone Encounter (Signed)
Advised tsh not necessary but patient still wants to know value

## 2015-08-12 NOTE — Telephone Encounter (Signed)
TSH is not needed for another 3 months since her most recent TSH was normal.

## 2015-08-12 NOTE — Telephone Encounter (Signed)
Patient called request to know if she can have a lab order today to have thyroid check'd. Her husband has an appointment this afternoon and was coming with him. Thanks

## 2015-08-12 NOTE — Telephone Encounter (Signed)
Is it time for pt to have TSH drawn?

## 2015-08-13 LAB — TSH: TSH: 0.73 m[IU]/L

## 2015-08-17 ENCOUNTER — Other Ambulatory Visit: Payer: Self-pay | Admitting: Family Medicine

## 2015-09-11 ENCOUNTER — Encounter: Payer: Self-pay | Admitting: Family Medicine

## 2015-09-11 ENCOUNTER — Ambulatory Visit (INDEPENDENT_AMBULATORY_CARE_PROVIDER_SITE_OTHER): Payer: Medicare Other | Admitting: Family Medicine

## 2015-09-11 VITALS — BP 146/79 | HR 75 | Wt 202.0 lb

## 2015-09-11 DIAGNOSIS — E559 Vitamin D deficiency, unspecified: Secondary | ICD-10-CM

## 2015-09-11 DIAGNOSIS — L723 Sebaceous cyst: Secondary | ICD-10-CM | POA: Diagnosis not present

## 2015-09-11 DIAGNOSIS — L089 Local infection of the skin and subcutaneous tissue, unspecified: Secondary | ICD-10-CM

## 2015-09-11 MED ORDER — ALPRAZOLAM 0.5 MG PO TABS: 0.5000 mg | ORAL_TABLET | Freq: Every evening | ORAL | Status: DC | PRN

## 2015-09-11 MED ORDER — CLINDAMYCIN HCL 300 MG PO CAPS: 300.0000 mg | ORAL_CAPSULE | Freq: Three times a day (TID) | ORAL | Status: DC

## 2015-09-11 NOTE — Progress Notes (Signed)
CC: Carol Norton is a 70 y.o. female is here for Cyst   Subjective: HPI:  Yesterday she began to have some tenderness between her breasts. It's localized to a site where she's had a sebaceous cyst drained 4 times over the last 10 years. It's worse to the touch but painless if not touching it. She's had some mild redness over the site. In the past she's been able to take an antibiotic when he gets painful and it prevents this is from enlarging to the point where needs to be drained. She denies fevers, chills or skin changes elsewhere. No breast complaints other than above.  She's completed 3 months of weekly vitamin D replacement and wants to know if her levels are normal now. She is not certain whether or not it helped with some fatigue.  She requested refills on Xanax which he only takes to help fall asleep due to anxiety that keeps her awake at night.    Review Of Systems Outlined In HPI  Past Medical History  Diagnosis Date  . Hyperlipemia   . Chronic headache   . Sleep apnea   . Ocular rosacea   . Sleep apnea   . Glaucoma   . Hypothyroid   . Glaucoma   . Ocular rosacea   . Anxiety     Past Surgical History  Procedure Laterality Date  . Abdominal hysterectomy    . Tubal ligation    . Breast surgery      sebaceous cyst  . Septoplasty    . Rhinoplasty    . Facial cosmetic surgery      1995  . Meniscus repair Left   . Ganglion cyst excision     Family History  Problem Relation Age of Onset  . Emphysema Father   . Allergies Mother   . Heart disease Mother   . Ovarian cancer Mother   . Hyperlipidemia Mother   . Hypothyroidism Mother   . Hypothyroidism Sister     Social History   Social History  . Marital Status: Married    Spouse Name: N/A  . Number of Children: 2  . Years of Education: 13   Occupational History  . retired    Social History Main Topics  . Smoking status: Former Smoker -- 0.75 packs/day for 25 years    Types: Cigarettes    Quit date:  05/25/2007  . Smokeless tobacco: Never Used     Comment: quit for 10 years in the total 23yrs.   . Alcohol Use: No     Comment: rare  . Drug Use: No  . Sexual Activity: Not on file   Other Topics Concern  . Not on file   Social History Narrative   Patient has recently quit drinking caffeine.   Patient is right handed.      Objective: BP 146/79 mmHg  Pulse 75  Wt 202 lb (91.627 kg)  Vital signs reviewed. General: Alert and Oriented, No Acute Distress HEENT: Pupils equal, round, reactive to light. Conjunctivae clear.  External ears unremarkable.  Moist mucous membranes. Lungs: Clear and comfortable work of breathing, speaking in full sentences without accessory muscle use. Cardiac: Regular rate and rhythm.  Neuro: CN II-XII grossly intact, gait normal. Extremities: No peripheral edema.  Strong peripheral pulses.  Mental Status: No depression, anxiety, nor agitation. Logical though process. Skin: Warm and dry. Exactly between the breast there is a small subcutaneous nodule with overlying scar tissue from prior excisions, mild redness at the site.  Assessment & Plan: Carol Norton was seen today for cyst.  Diagnoses and all orders for this visit:  Infected sebaceous cyst -     clindamycin (CLEOCIN) 300 MG capsule; Take 1 capsule (300 mg total) by mouth 3 (three) times daily.  Vitamin D deficiency -     VITAMIN D 25 Hydroxy (Vit-D Deficiency, Fractures)  Other orders -     ALPRAZolam (XANAX) 0.5 MG tablet; Take 1 tablet (0.5 mg total) by mouth at bedtime as needed. for sleep   Infected sebaceous cyst should respond to clindamycin follow-up on Monday or Tuesday of next week if enlarging I be happy to help drain this.  Vit D deficiency: Due for repeat vitamin D level today   insomnia controlled with as needed use of alprazolam    Return if symptoms worsen or fail to improve.

## 2015-09-12 LAB — VITAMIN D 25 HYDROXY (VIT D DEFICIENCY, FRACTURES): VIT D 25 HYDROXY: 24 ng/mL — AB (ref 30–100)

## 2015-09-15 ENCOUNTER — Telehealth: Payer: Self-pay | Admitting: Family Medicine

## 2015-09-15 MED ORDER — VITAMIN D (ERGOCALCIFEROL) 1.25 MG (50000 UNIT) PO CAPS: 50000.0000 [IU] | ORAL_CAPSULE | ORAL | Status: DC

## 2015-09-15 NOTE — Telephone Encounter (Signed)
Will you please let patient know that her vitamin d level remains in the deficient range and I'd recommend she restart taking a weekly vitamin d supplement that [facility]

## 2015-09-15 NOTE — Telephone Encounter (Signed)
One thing that should help her absorb vitamin d is to take the mediation with a fatty meal like ground beef, steak, pork chops, eggs, bacon, or fried food since vitamin d is a fat soluble vitamin.

## 2015-09-15 NOTE — Telephone Encounter (Signed)
Pt notified. She asked do you think she may have a problem with absorbing vitamin D?  She stated she was only off the medication for a couple weeks and don't think that her levels should have dropped that much. Any suggestions?

## 2015-09-15 NOTE — Telephone Encounter (Signed)
Pt.notified

## 2015-09-22 ENCOUNTER — Ambulatory Visit (INDEPENDENT_AMBULATORY_CARE_PROVIDER_SITE_OTHER): Payer: Medicare Other | Admitting: Family Medicine

## 2015-09-22 ENCOUNTER — Encounter: Payer: Self-pay | Admitting: Family Medicine

## 2015-09-22 VITALS — BP 121/70 | HR 68 | Wt 204.0 lb

## 2015-09-22 DIAGNOSIS — L723 Sebaceous cyst: Secondary | ICD-10-CM | POA: Diagnosis not present

## 2015-09-22 DIAGNOSIS — L089 Local infection of the skin and subcutaneous tissue, unspecified: Secondary | ICD-10-CM

## 2015-09-22 NOTE — Progress Notes (Signed)
CC: Carol Norton is a 70 y.o. female is here for Cyst   Subjective: HPI:  Complains of continued right breast pain despite taking clindamycin on a daily basis with 100% compliance. She's noticed that the swelling and pain seems to be a little bit worse since I saw her the other week. She's not noticed any overlying skin changes other than some redness and mild swelling. She denies any other breast complaints such as breast architectural changes or nipple discharge. She denies fevers, chills or swollen lymph nodes. The site of discomfort has required incision and drainage 4 other times within the past 10 years.   Review Of Systems Outlined In HPI  Past Medical History  Diagnosis Date  . Hyperlipemia   . Chronic headache   . Sleep apnea   . Ocular rosacea   . Sleep apnea   . Glaucoma   . Hypothyroid   . Glaucoma   . Ocular rosacea   . Anxiety     Past Surgical History  Procedure Laterality Date  . Abdominal hysterectomy    . Tubal ligation    . Breast surgery      sebaceous cyst  . Septoplasty    . Rhinoplasty    . Facial cosmetic surgery      1995  . Meniscus repair Left   . Ganglion cyst excision     Family History  Problem Relation Age of Onset  . Emphysema Father   . Allergies Mother   . Heart disease Mother   . Ovarian cancer Mother   . Hyperlipidemia Mother   . Hypothyroidism Mother   . Hypothyroidism Sister     Social History   Social History  . Marital Status: Married    Spouse Name: N/A  . Number of Children: 2  . Years of Education: 13   Occupational History  . retired    Social History Main Topics  . Smoking status: Former Smoker -- 0.75 packs/day for 25 years    Types: Cigarettes    Quit date: 05/25/2007  . Smokeless tobacco: Never Used     Comment: quit for 10 years in the total 77yrs.   . Alcohol Use: No     Comment: rare  . Drug Use: No  . Sexual Activity: Not on file   Other Topics Concern  . Not on file   Social History Narrative   Patient has recently quit drinking caffeine.   Patient is right handed.      Objective: BP 121/70 mmHg  Pulse 68  Wt 204 lb (92.534 kg)  Vital signs reviewed. General: Alert and Oriented, No Acute Distress HEENT: Pupils equal, round, reactive to light. Conjunctivae clear.  External ears unremarkable.  Moist mucous membranes. Lungs: Clear and comfortable work of breathing, speaking in full sentences without accessory muscle use. Cardiac: Regular rate and rhythm.  Neuro: CN II-XII grossly intact, gait normal. Extremities: No peripheral edema.  Strong peripheral pulses.  Mental Status: No depression, anxiety, nor agitation. Logical though process. Skin: Warm and dry. Induration and tenderness on the right medial inferior aspect of the breast where the breast To the sternal region.  Assessment & Plan: Carol Norton was seen today for cyst.  Diagnoses and all orders for this visit:  Infected sebaceous cyst   Sebaceous cyst appears to be infected and not responding to clindamycin and therefore incision and drainage was offered today which she would like to go through with.Signs and symptoms requring emergent/urgent reevaluation were discussed with the patient.  Incision and Drainage Procedure Note  Pre-operative Diagnosis:infected sebaceous cyst  Post-operative Diagnosis: same  Indications: pain  Anesthesia: 1% lidocaine with epinephrine  Procedure Details  The procedure, risks and complications have been discussed in detail (including, but not limited to airway compromise, infection, bleeding) with the patient, and the patient has signed consent to the procedure.  The skin was sterilely prepped and draped over the affected area in the usual fashion. After adequate local anesthesia, I&D with a 79mm punch biopsy blade was performed on the right breast. Purulent drainage: present The patient was observed until stable.  Findings: sucess  EBL: 1 cc's  Drains: none  Condition:  Tolerated procedure well   Complications: none.      Return if symptoms worsen or fail to improve.

## 2015-10-10 ENCOUNTER — Other Ambulatory Visit: Payer: Self-pay | Admitting: Family Medicine

## 2015-12-15 ENCOUNTER — Telehealth: Payer: Self-pay | Admitting: Family Medicine

## 2015-12-15 NOTE — Telephone Encounter (Addendum)
Patient called to see if we received a referral from her gynecologist.  Patient cannot remember the doctor's name but said they sent a referral. She would like to make a new patient appt.

## 2015-12-15 NOTE — Telephone Encounter (Signed)
I am not sure... I do not think I received this.

## 2015-12-23 ENCOUNTER — Ambulatory Visit (INDEPENDENT_AMBULATORY_CARE_PROVIDER_SITE_OTHER): Payer: Medicare Other | Admitting: Family Medicine

## 2015-12-23 ENCOUNTER — Encounter: Payer: Self-pay | Admitting: Family Medicine

## 2015-12-23 VITALS — BP 154/67 | HR 87 | Wt 205.0 lb

## 2015-12-23 DIAGNOSIS — E039 Hypothyroidism, unspecified: Secondary | ICD-10-CM

## 2015-12-23 DIAGNOSIS — R5383 Other fatigue: Secondary | ICD-10-CM

## 2015-12-23 DIAGNOSIS — R739 Hyperglycemia, unspecified: Secondary | ICD-10-CM

## 2015-12-23 DIAGNOSIS — E559 Vitamin D deficiency, unspecified: Secondary | ICD-10-CM | POA: Diagnosis not present

## 2015-12-23 DIAGNOSIS — M79641 Pain in right hand: Secondary | ICD-10-CM

## 2015-12-23 MED ORDER — MELOXICAM 15 MG PO TABS: 15.0000 mg | ORAL_TABLET | ORAL | 2 refills | Status: DC | PRN

## 2015-12-23 MED ORDER — ALPRAZOLAM 0.5 MG PO TABS: 0.5000 mg | ORAL_TABLET | Freq: Every evening | ORAL | 2 refills | Status: DC | PRN

## 2015-12-23 NOTE — Progress Notes (Signed)
CC: Carol Norton is a 70 y.o. female is here for Medication Refill and Labs Only   Subjective: HPI:  Follow-up vitamin D deficiency: She is taking a vitamin D supplement once a week. She still doesn't feel like it's providing her any benefit from fatigue. She still feels fatigued most days out of the week. She felt worse last week for the described as nonrestorative sleep and not wanting to get out of bed. She felt fatigued doing simple household chores. Fortunately this significantly improved after her levothyroxine dose was changed 88 MCG middle of last week. Symptoms are still present to a mild degree and she wants to know if there some sort of nutritional deficiency that could be causing this. She's not had her blood sugar checked within the last 3 months.  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 She is getting numbness and pain in the right hand localized to the palm of the thumb first and second fingers. It's worse at night and first thing in the morning when she wakes up. Improved temporarily with a steroid injection however she wants to pursue surgery.  She is requesting a refill on Xanax to help with sleep.  Review Of Systems Outlined In HPI  Past Medical History:  Diagnosis Date  . Anxiety   . Chronic headache   . Glaucoma   . Glaucoma   . Hyperlipemia   . Hypothyroid   . Ocular rosacea   . Ocular rosacea   . Sleep apnea   . Sleep apnea     Past Surgical History:  Procedure Laterality Date  . ABDOMINAL HYSTERECTOMY    . BREAST SURGERY     sebaceous cyst  . FACIAL COSMETIC SURGERY     1995  . GANGLION CYST EXCISION    . MENISCUS REPAIR Left   . RHINOPLASTY    . SEPTOPLASTY    . TUBAL LIGATION     Family History  Problem Relation Age of Onset  . Emphysema Father   . Allergies Mother   . Heart disease Mother   . Ovarian cancer Mother   . Hyperlipidemia Mother   . Hypothyroidism Mother   .  Hypothyroidism Sister     Social History   Social History  . Marital status: Married    Spouse name: N/A  . Number of children: 2  . Years of education: 13   Occupational History  . retired    Social History Main Topics  . Smoking status: Former Smoker    Packs/day: 0.75    Years: 25.00    Types: Cigarettes    Quit date: 05/25/2007  . Smokeless tobacco: Never Used     Comment: quit for 10 years in the total 42yrs.   . Alcohol use No     Comment: rare  . Drug use: No  . Sexual activity: Not on file   Other Topics Concern  . Not on file   Social History Narrative   Patient has recently quit drinking caffeine.   Patient is right handed.      Objective: BP (!) 154/67   Pulse 87   Wt 205 lb (93 kg)   BMI 32.11 kg/m   Vital signs reviewed. General: Alert and Oriented, No Acute Distress HEENT: Pupils equal, round, reactive to light. Conjunctivae clear.  External ears unremarkable.  Moist mucous membranes. Lungs: Clear and comfortable work of breathing, speaking in full sentences without accessory muscle use.  Cardiac: Regular rate and rhythm.  Neuro: CN II-XII grossly intact, gait normal. Extremities: No peripheral edema.  Strong peripheral pulses.  Mental Status: No depression, anxiety, nor agitation. Logical though process. Skin: Warm and dry.  Assessment & Plan: Carol Norton was seen today for medication refill and labs only.  Diagnoses and all orders for this visit:  Vitamin D deficiency -     VITAMIN D 25 Hydroxy (Vit-D Deficiency, Fractures)  Hypothyroidism, unspecified hypothyroidism type  Hyperglycemia -     Hemoglobin A1c  Other fatigue -     meloxicam (MOBIC) 15 MG tablet; Take 1 tablet (15 mg total) by mouth as needed for pain. -     Ferritin -     Vitamin B12 -     Magnesium  Right hand pain -     Ambulatory referral to Neurology  Other orders -     Cancel: VITAMIN D 25 Hydroxy (Vit-D Deficiency, Fractures) -     ALPRAZolam (XANAX) 0.5 MG tablet;  Take 1 tablet (0.5 mg total) by mouth at bedtime as needed. for sleep  40 minutes spent face-to-face during visit today of which at least 50% was counseling or coordinating care regarding: 1. Vitamin D deficiency   2. Hypothyroidism, unspecified hypothyroidism type   3. Hyperglycemia   4. Other fatigue   5. Right hand pain    Discussed with this patient that I will be resigning from my position here with [employer] in September in order to stay with my family who will be moving to [facility]. I let him know about the providers that are still accepting patients and I feel that this individual will be under great care if he/she stays here with [facility].   Return in about 3 months (around 03/24/2016).

## 2015-12-24 LAB — HEMOGLOBIN A1C
Hgb A1c MFr Bld: 5.9 % — ABNORMAL HIGH (ref <5.7)
Mean Plasma Glucose: 123 mg/dL

## 2015-12-24 LAB — VITAMIN D 25 HYDROXY (VIT D DEFICIENCY, FRACTURES): VIT D 25 HYDROXY: 29 ng/mL — AB (ref 30–100)

## 2015-12-24 LAB — MAGNESIUM: MAGNESIUM: 1.8 mg/dL (ref 1.5–2.5)

## 2015-12-24 LAB — FERRITIN: FERRITIN: 134 ng/mL (ref 20–288)

## 2015-12-24 LAB — VITAMIN B12: VITAMIN B 12: 463 pg/mL (ref 200–1100)

## 2015-12-25 ENCOUNTER — Telehealth: Payer: Self-pay | Admitting: Family Medicine

## 2015-12-25 MED ORDER — VITAMIN D (ERGOCALCIFEROL) 1.25 MG (50000 UNIT) PO CAPS: 100000.0000 [IU] | ORAL_CAPSULE | ORAL | 0 refills | Status: DC

## 2015-12-25 NOTE — Telephone Encounter (Signed)
Results and recommendations left on vm

## 2015-12-25 NOTE — Telephone Encounter (Signed)
Will you please let patient know that her labs were normal other than a A1c of 5.9 and a vitamin D level that's only one point away from being normal. I would recommend increasing her vitamin D supplement to 2 capsules of the vitamin D supplement a week [facility]. I like to recheck this value in one month.

## 2016-01-20 ENCOUNTER — Encounter: Payer: Self-pay | Admitting: Internal Medicine

## 2016-01-20 ENCOUNTER — Ambulatory Visit (INDEPENDENT_AMBULATORY_CARE_PROVIDER_SITE_OTHER): Payer: Medicare Other | Admitting: Internal Medicine

## 2016-01-20 VITALS — BP 142/78 | HR 87 | Ht 66.0 in | Wt 207.0 lb

## 2016-01-20 DIAGNOSIS — E039 Hypothyroidism, unspecified: Secondary | ICD-10-CM

## 2016-01-20 NOTE — Progress Notes (Addendum)
Patient ID: Carol Norton, female   DOB: July 13, 1945, 70 y.o.   MRN: [id number]    HPI  Carol Norton is a 70 y.o.-year-old female, referred by Laretta Alstrom, NP, for management of hypothyroidism.  Pt. has been dx with hypothyroidism in 1990 >> on Levothyroxine initially, then tried Armour >> then back to Levothyroxine >> increased hair loss, fatigue, SOB, HAs >> now back on Armour 60 mg daily >> feels much better now: Less fatigue, more active, no HA.   She takes the thyroid hormone: - fasting at 6:30 am, chews it - with water - separated by >2 hrs from b'fast  - no calcium, iron, multivitamins  - on Tums, PPIs - on Biotin SR - taken at night - on vit D 100,000 IU weekly  I reviewed pt's thyroid tests: 12/15/2015: TSH 0.060 on LT4 100 >> decreased LT4 to 88 >> then switched to Armour 60 mg 10/21/2015: 0.285 09/12/2015: 0.941 Lab Results  Component Value Date   TSH 0.73 08/12/2015   TSH 1.090 05/13/2015   TSH 0.220 (L) 04/09/2015   TSH 0.143 (L) 02/25/2015   TSH 1.231 12/17/2014   FREET4 0.97 04/09/2015   FREET4 0.85 02/25/2015   FREET4 0.71 (L) 12/17/2014    Pt describes: - + fatigue >> better on the 88 mcg LT4 - + hair loss - + weight gain - + Heat intolerance - No anxiety, no depression - + constipation - No dry skin - + hair loss  Pt denies feeling nodules in neck, hoarseness, dysphagia/odynophagia, SOB with lying down.  She has + FH of thyroid disorders in: M and sister. No FH of thyroid cancer.  No h/o radiation tx to head or neck. No recent use of iodine supplements.  ROS: Constitutional: + See history of present illness Eyes: no blurry vision, no xerophthalmia ENT: no sore throat, no nodules palpated in throat, no dysphagia/odynophagia, no hoarseness, + tinnitus, + hypoacusis Cardiovascular: no CP/SOB/palpitations/leg swelling Respiratory: no cough/SOB Gastrointestinal: no N/V/D/C/+ heartburn Musculoskeletal: + Both: muscle/joint aches Skin: no rashes, + hair  loss Neurological: no tremors/numbness/tingling/dizziness Psychiatric: no depression/anxiety, results headaches + Low libido  Past Medical History:  Diagnosis Date  . Anxiety   . Chronic headache   . Glaucoma   . Glaucoma   . Hyperlipemia   . Hypothyroid   . Ocular rosacea   . Ocular rosacea   . Sleep apnea   . Sleep apnea    Past Surgical History:  Procedure Laterality Date  . ABDOMINAL HYSTERECTOMY    . BREAST SURGERY     sebaceous cyst  . FACIAL COSMETIC SURGERY     1995  . GANGLION CYST EXCISION    . MENISCUS REPAIR Left   . RHINOPLASTY    . SEPTOPLASTY    . TUBAL LIGATION     Social History   Social History  . Marital status: Married    Spouse name: N/A  . Number of children: 2  . Years of education: 13   Occupational History  . retired    Social History Main Topics  . Smoking status: Former Smoker    Packs/day: 0.75    Years: 25.00    Types: Cigarettes    Quit date: 05/25/2007  . Smokeless tobacco: Never Used     Comment: quit for 10 years in the total 103yrs.   . Alcohol use No     Comment: rare  . Drug use: No  . Sexual activity: Not on file   Other Topics Concern  .  Not on file   Social History Narrative   Patient has recently quit drinking caffeine.   Patient is right handed.    Current Outpatient Prescriptions on File Prior to Visit  Medication Sig Dispense Refill  . acyclovir ointment (ZOVIRAX) 5 % Apply 1 application topically every 3 (three) hours as needed. Fever blisters. 15 g 2  . ALPRAZolam (XANAX) 0.5 MG tablet Take 1 tablet (0.5 mg total) by mouth at bedtime as needed. for sleep 30 tablet 2  . BIOTIN PO Take 1 capsule by mouth daily. Plus folic acid    . doxycycline (VIBRA-TABS) 100 MG tablet Take 0.5 tablets (50 mg total) by mouth 2 (two) times daily. 20 tablet 0  . fexofenadine (ALLEGRA) 180 MG tablet Take 180 mg by mouth as needed.     . Magnesium 100 MG CAPS Take by mouth.    . meloxicam (MOBIC) 15 MG tablet Take 1 tablet (15  mg total) by mouth as needed for pain. 30 tablet 2  . Methylsulfonylmethane (MSM PO) Take 1 tablet by mouth daily.    . mometasone (NASONEX) 50 MCG/ACT nasal spray Place 2 sprays into the nose as needed.     Marland Kitchen OVER THE COUNTER MEDICATION Stool softner    . oxymetazoline (AFRIN) 0.05 % nasal spray Place 2 sprays into the nose 2 (two) times daily as needed.     . Polyethylene Glycol 400 (BLINK TEARS OP) Apply to eye at bedtime as needed.    . Senna-Psyllium (PERDIEM PO) Take 3 tablets by mouth at bedtime.    . Vitamin D, Ergocalciferol, (DRISDOL) 50000 units CAPS capsule Take 2 capsules (100,000 Units total) by mouth every 7 (seven) days. 24 capsule 0  . clindamycin (CLEOCIN) 300 MG capsule Take 1 capsule (300 mg total) by mouth 3 (three) times daily. (Patient not taking: Reported on 01/20/2016) 30 capsule 0  . levothyroxine (SYNTHROID) 100 MCG tablet Take 100 mg by mouth daily.     No current facility-administered medications on file prior to visit.    Allergies  Allergen Reactions  . Avelox [Moxifloxacin Hcl In Nacl]   . Citalopram   . Crestor [Rosuvastatin]   . Dexilant [Dexlansoprazole]   . Escitalopram Oxalate   . Statins     Myalgias, joint pain.  . Wellbutrin [Bupropion]    Family History  Problem Relation Age of Onset  . Emphysema Father   . Allergies Mother   . Heart disease Mother   . Ovarian cancer Mother   . Hyperlipidemia Mother   . Hypothyroidism Mother   . Hypothyroidism Sister    PE: BP (!) 142/78 (BP Location: Left Arm, Patient Position: Sitting)   Pulse 87   Ht 5\' 6"  (1.676 m)   Wt 207 lb (93.9 kg)   SpO2 97%   BMI 33.41 kg/m  Wt Readings from Last 3 Encounters:  01/20/16 207 lb (93.9 kg)  12/23/15 205 lb (93 kg)  09/22/15 204 lb (92.5 kg)   Constitutional: overweight, in NAD Eyes: PERRLA, EOMI, no exophthalmos ENT: moist mucous membranes, no thyromegaly but slight L lobe prominence, no cervical lymphadenopathy Cardiovascular: RRR, No MRG Respiratory:  CTA B Gastrointestinal: abdomen soft, NT, ND, BS+ Musculoskeletal: no deformities, strength intact in all 4 Skin: moist, warm, no rashes Neurological: no tremor with outstretched hands, DTR normal in all 4  ASSESSMENT: 1. Hypothyroidism  PLAN:  1. Patient with long-standing hypothyroidism, on Armour therapy. She is now on 60 mg of Armour, which is the equivalent of 100  g of levothyroxine. She feels much better on Armour, without as much fatigue and without any headaches compared to her previous regimen of levothyroxine. I advised her to continue with Armour, but we need to adjust her dose so that her TSH returns to normal. - she appears euthyroid today - she does not appear to have a goiter, thyroid nodules, or neck compression symptoms - We discussed about correct intake of thyroid hormone, fasting, with water, separated by at least 30 minutes from breakfast, and separated by more than 4 hours from calcium, iron, multivitamins, acid reflux medications (PPIs). She is taking it correctly. - will check thyroid tests: TSH, free T4, free T3, and ATA and TPO antibodies to check for Hashimoto's thyroiditis. Since she took her biotin last night, I advised her to come back in 4-5 days for these tests. - If labs today are abnormal, she will need to return in 6 weeks for repeat labs - Otherwise, I will see her back in 4 months  Component     Latest Ref Rng & Units 02/02/2016  TSH     0.35 - 4.50 uIU/mL 0.23 (L)  T4,Free(Direct)     0.60 - 1.60 ng/dL 0.61  Triiodothyronine,Free,Serum     2.3 - 4.2 pg/mL 3.5  Thyroglobulin Ab     <2 IU/mL <1  Thyroperoxidase Ab SerPl-aCnc     <9 IU/mL 2   No signs of Hashimoto's thyroiditis (thyroid antibodies are not elevated). TSH is slightly low, with normal free T4 and free T3. I would suggest to cut the Armour dose in half one day of the week and will recheck her TFTs at next visit.  [name], MD PhD [facility] End[facility]

## 2016-01-20 NOTE — Patient Instructions (Signed)
Please come back in 4-5 days for labs.  Take the thyroid hormone every day, with water, at least 30 minutes before breakfast, separated by at least 4 hours from: - acid reflux medications - calcium - iron - multivitamins  Please come back for a follow-up appointment in 4 months.

## 2016-01-29 ENCOUNTER — Other Ambulatory Visit: Payer: Medicare Other

## 2016-02-02 ENCOUNTER — Other Ambulatory Visit (INDEPENDENT_AMBULATORY_CARE_PROVIDER_SITE_OTHER): Payer: Medicare Other

## 2016-02-02 DIAGNOSIS — E039 Hypothyroidism, unspecified: Secondary | ICD-10-CM | POA: Diagnosis not present

## 2016-02-02 LAB — TSH: TSH: 0.23 u[IU]/mL — AB (ref 0.35–4.50)

## 2016-02-02 LAB — T4, FREE: FREE T4: 0.61 ng/dL (ref 0.60–1.60)

## 2016-02-02 LAB — T3, FREE: T3 FREE: 3.5 pg/mL (ref 2.3–4.2)

## 2016-02-03 LAB — THYROGLOBULIN ANTIBODY: Thyroglobulin Ab: <1 IU/mL (ref <2)

## 2016-02-03 LAB — THYROID PEROXIDASE ANTIBODY: Thyroperoxidase Ab SerPl-aCnc: 2 IU/mL (ref <9)

## 2016-03-09 ENCOUNTER — Encounter: Payer: Medicare Other | Admitting: Neurology

## 2016-03-09 ENCOUNTER — Ambulatory Visit: Payer: Medicare Other

## 2016-03-11 ENCOUNTER — Encounter (INDEPENDENT_AMBULATORY_CARE_PROVIDER_SITE_OTHER): Payer: Self-pay | Admitting: Diagnostic Neuroimaging

## 2016-03-11 ENCOUNTER — Ambulatory Visit (INDEPENDENT_AMBULATORY_CARE_PROVIDER_SITE_OTHER): Payer: Medicare Other | Admitting: Diagnostic Neuroimaging

## 2016-03-11 DIAGNOSIS — R200 Anesthesia of skin: Secondary | ICD-10-CM

## 2016-03-11 DIAGNOSIS — Z0289 Encounter for other administrative examinations: Secondary | ICD-10-CM

## 2016-03-11 NOTE — Procedures (Signed)
   [facility]  NCS (NERVE CONDUCTION STUDY) WITH EMG (ELECTROMYOGRAPHY) REPORT   STUDY DATE: 03/11/16 PATIENT NAME: Carol Norton DOB: May 30, 1945 MRN: [id number]  ORDERING CLINICIAN: Lajoyce Lauber, DO  TECHNOLOGIST: Laretta Alstrom  ELECTROMYOGRAPHER: Earlean Polka. Grier Vu, MD  CLINICAL INFORMATION: 70 year old female with bilateral hand numbness and pain x 1-2 years.  FINDINGS: NERVE CONDUCTION STUDY: Bilateral median motor responses have prolonged distal latencies (right 5.8 ms, left 6.0 ms), decreased amplitudes, normal conduction velocities and normal F-wave latencies.  Bilateral ulnar motor responses and F wave latencies are normal.  Bilateral median sensory responses have prolonged distal latencies normal amplitudes.   Bilateral ulnar sensory responses are normal.   NEEDLE ELECTROMYOGRAPHY: Needle examination of right upper extremity deltoid, biceps, triceps, flexor carpi radialis, first dorsal interosseous is normal.    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     INTERPRETING PHYSICIAN:  Penni Bombard, MD Certified in Neurology, Neurophysiology and Neuroimaging  [facility] [address], [place], [place] [address] [phone]

## 2016-03-17 ENCOUNTER — Other Ambulatory Visit: Payer: Self-pay | Admitting: *Deleted

## 2016-03-17 DIAGNOSIS — R5383 Other fatigue: Secondary | ICD-10-CM

## 2016-03-17 MED ORDER — MELOXICAM 15 MG PO TABS
15.0000 mg | ORAL_TABLET | ORAL | 0 refills | Status: DC | PRN
Start: 2016-03-17 — End: 2016-05-13

## 2016-04-20 ENCOUNTER — Other Ambulatory Visit: Payer: Self-pay | Admitting: Family Medicine

## 2016-04-20 DIAGNOSIS — R5383 Other fatigue: Secondary | ICD-10-CM

## 2016-05-13 ENCOUNTER — Ambulatory Visit (INDEPENDENT_AMBULATORY_CARE_PROVIDER_SITE_OTHER): Payer: Medicare Other | Admitting: Physician Assistant

## 2016-05-13 ENCOUNTER — Encounter: Payer: Self-pay | Admitting: Physician Assistant

## 2016-05-13 VITALS — BP 137/82 | HR 82 | Wt 205.0 lb

## 2016-05-13 DIAGNOSIS — E669 Obesity, unspecified: Secondary | ICD-10-CM | POA: Insufficient documentation

## 2016-05-13 DIAGNOSIS — E559 Vitamin D deficiency, unspecified: Secondary | ICD-10-CM

## 2016-05-13 DIAGNOSIS — G4733 Obstructive sleep apnea (adult) (pediatric): Secondary | ICD-10-CM

## 2016-05-13 DIAGNOSIS — F1310 Sedative, hypnotic or anxiolytic abuse, uncomplicated: Secondary | ICD-10-CM | POA: Diagnosis not present

## 2016-05-13 DIAGNOSIS — M1712 Unilateral primary osteoarthritis, left knee: Secondary | ICD-10-CM | POA: Diagnosis not present

## 2016-05-13 DIAGNOSIS — E782 Mixed hyperlipidemia: Secondary | ICD-10-CM | POA: Insufficient documentation

## 2016-05-13 DIAGNOSIS — Z0000 Encounter for general adult medical examination without abnormal findings: Secondary | ICD-10-CM | POA: Diagnosis not present

## 2016-05-13 DIAGNOSIS — E039 Hypothyroidism, unspecified: Secondary | ICD-10-CM

## 2016-05-13 DIAGNOSIS — F411 Generalized anxiety disorder: Secondary | ICD-10-CM | POA: Insufficient documentation

## 2016-05-13 DIAGNOSIS — R7302 Impaired glucose tolerance (oral): Secondary | ICD-10-CM | POA: Insufficient documentation

## 2016-05-13 LAB — CBC
HCT: 42.9 % (ref 35.0–45.0)
HEMOGLOBIN: 14.3 g/dL (ref 11.7–15.5)
MCH: 31 pg (ref 27.0–33.0)
MCHC: 33.3 g/dL (ref 32.0–36.0)
MCV: 92.9 fL (ref 80.0–100.0)
MPV: 11.1 fL (ref 7.5–12.5)
Platelets: 271 10*3/uL (ref 140–400)
RBC: 4.62 MIL/uL (ref 3.80–5.10)
RDW: 14.2 % (ref 11.0–15.0)
WBC: 8.2 10*3/uL (ref 3.8–10.8)

## 2016-05-13 MED ORDER — ALPRAZOLAM 0.25 MG PO TABS: 0.2500 mg | ORAL_TABLET | Freq: Every evening | ORAL | 2 refills | Status: DC | PRN

## 2016-05-13 MED ORDER — MELOXICAM 15 MG PO TABS: 15.0000 mg | ORAL_TABLET | ORAL | 3 refills | Status: DC | PRN

## 2016-05-13 NOTE — Progress Notes (Signed)
HPI:                                                                Carol Norton is a 70 y.o. female who presents to [facility] [place] today to establish care and refill meds  Thyroid disease: taking 60mg  Armour daily. Followed by Dr. Cruzita Lederer, Endocrinology. Feels improved since starting this medication. Denies symptoms of hypo/hyperthyroid.  Osteoarthritis: has an appt. with [facility]. She has chronic left knee pain and she is s/p knee arthroscopy for meniscal tear. Would like to refill her Mobic.  Anxiety: she states she was put on Xanax at age 62 when she had a complete hysterectomy. States she "gets excited" and is "not a calm person." Takes 0.25-0.5mg  every night before bed. She also takes it prn when there is stress related to "family drama." Took it once or twice over Thanksgiving. States she has been on Wellbutrin and Citalopram, but she is allergic. States other medicines don't work for her and that they make her "eat chocolate" and make her tired.    Prediabetes: denies polydipsia, polyphagia, or polyuria. She has received the pneumonia vaccine.  Lab Results  Component Value Date   HGBA1C 5.9 (H) 12/23/2015   Obstructive Sleep Apnea: using CPAP nightly. Denies hypersomnolence.  Health Maintenance Health Maintenance  Topic Date Due  . Hepatitis C Screening  [birth year]  . DEXA SCAN  07/19/2011  . MAMMOGRAM  05/11/2014  . COLONOSCOPY  05/24/2014  . PNA vac Low Risk Adult (2 of 2 - PPSV23) 12/13/2014  . INFLUENZA VACCINE  12/23/2015  . TETANUS/TDAP  12/13/2023  . ZOSTAVAX  Completed  Colonoscopy: states she has had two negative screening colonoscopies in the past. She would like to do Cologuard DEXA: patient declined. Last DEXA performed 02/2008 showed no osteoporosis/osteopenia Mammogram: due Pap smear: n/a, s/p complete hyst  GYN/Sexual Health  Menstrual status: postmenopausal  LMP: TAH/BSO, age 39  History of  abnormal pap smears: no  Sexually active: no  Past Medical History:  Diagnosis Date  . Allergic rhinitis   . Allergy   . Anxiety   . Chronic constipation   . Chronic headache   . Chronic sinusitis   . GERD (gastroesophageal reflux disease)   . Glaucoma   . Glaucoma   . Hyperlipemia   . Hypothyroid   . Ocular rosacea   . Sebaceous cyst   . Sleep apnea   . Sleep apnea   . Vitamin D deficiency    Past Surgical History:  Procedure Laterality Date  . ABDOMINAL HYSTERECTOMY    . BREAST SURGERY     sebaceous cyst  . FACIAL COSMETIC SURGERY     1995  . GANGLION CYST EXCISION    . MENISCUS REPAIR Left   . RHINOPLASTY    . SEPTOPLASTY    . TUBAL LIGATION     Social History  Substance Use Topics  . Smoking status: Former Smoker    Packs/day: 0.75    Years: 25.00    Types: Cigarettes    Quit date: 05/25/2007  . Smokeless tobacco: Never Used     Comment: quit for 10 years in the total 22yrs.   . Alcohol use No     Comment: rare  family history includes Allergies in her mother; Emphysema in her father; Heart disease in her mother; Hyperlipidemia in her mother; Hypothyroidism in her mother and sister; Ovarian cancer in her mother.  Review of Systems  Constitutional: Negative for chills, fever, malaise/fatigue and weight loss.  HENT: Negative.   Eyes: Negative.   Respiratory: Negative.   Cardiovascular: Negative.   Gastrointestinal: Positive for constipation and heartburn.  Genitourinary: Negative.   Musculoskeletal: Positive for falls and joint pain (left knee).  Skin: Negative.   Neurological: Negative.  Negative for loss of consciousness.  Endo/Heme/Allergies: Negative.   Psychiatric/Behavioral: Negative for depression and substance abuse. The patient is nervous/anxious. The patient does not have insomnia.     Medications: Current Outpatient Prescriptions  Medication Sig Dispense Refill  . ALPRAZolam (XANAX) 0.25 MG tablet Take 1 tablet (0.25 mg total) by mouth  at bedtime as needed (Do not exceed 3 tabs in a 24 hour period). for sleep 30 tablet 2  . BIOTIN PO Take 1 capsule by mouth daily. Plus folic acid    . fexofenadine (ALLEGRA) 180 MG tablet Take 180 mg by mouth as needed.     . Magnesium 100 MG CAPS Take by mouth.    . meloxicam (MOBIC) 15 MG tablet Take 1 tablet (15 mg total) by mouth as needed for pain. 30 tablet 3  . Methylsulfonylmethane (MSM PO) Take 1 tablet by mouth daily.    Marland Kitchen OVER THE COUNTER MEDICATION Stool softner    . Polyethylene Glycol 400 (BLINK TEARS OP) Apply to eye at bedtime as needed.    . Senna-Psyllium (PERDIEM PO) Take 3 tablets by mouth at bedtime.    Marland Kitchen thyroid (ARMOUR) 60 MG tablet Take 60 mg by mouth.    . clindamycin (CLEOCIN) 300 MG capsule Take 1 capsule (300 mg total) by mouth 3 (three) times daily. (Patient not taking: Reported on 05/13/2016) 30 capsule 0  . doxycycline (VIBRA-TABS) 100 MG tablet Take 0.5 tablets (50 mg total) by mouth 2 (two) times daily. 20 tablet 0  . Vitamin D, Ergocalciferol, (DRISDOL) 50000 units CAPS capsule Take 2 capsules (100,000 Units total) by mouth every 7 (seven) days. (Patient not taking: Reported on 05/13/2016) 24 capsule 0   No current facility-administered medications for this visit.    Allergies  Allergen Reactions  . Avelox [Moxifloxacin Hcl In Nacl]   . Citalopram   . Crestor [Rosuvastatin]   . Dexilant [Dexlansoprazole]   . Escitalopram Oxalate   . Statins     Myalgias, joint pain.  . Wellbutrin [Bupropion]     Objective:  BP 137/82   Pulse 82   Wt 205 lb (93 kg)   BMI 33.09 kg/m  Gen: Alert, not ill-appearing, no distress HEENT: Extraocular movements intact, normal conjunctiva, TM's clear, oropharynx clear, moist mucus membranes, no thyromegaly or tenderness Lungs: Normal work of breathing, clear to auscultation bilaterally Heart: Normal rate, regular rhythm, s1 and s2 distinct, no murmurs, clicks or rubs appreciated on this exam Abd: Soft. Nondistended,  Nontender Extremities: distal pulses intact, no peripheral edema Left Knee: crepitus present, full active ROM, normal strength against resistance Skin: warm and dry, no rashes or lesions on exposed skin Psych: alert, defensive at times, but mostly cooperative, normal affect, occasionally slurred speech, organized thought content, poor insight   GAD 7 : Generalized Anxiety Score 05/13/2016  Nervous, Anxious, on Edge 1  Control/stop worrying 2  Worry too much - different things 2  Trouble relaxing 1  Restless 1  Easily annoyed  or irritable 0  Afraid - awful might happen 1  Total GAD 7 Score 8  Anxiety Difficulty Somewhat difficult     Assessment and Plan: 70 y.o. female with   Encounter for preventative adult health care examination - CBC - Comprehensive metabolic panel - Cologuard ordered - Mammogram ordered - counseled on importance of daily calcium and vitamin D supplementation to prevent fractures. She thinks calcium will interfere with her thyroid. She declined a DEXA scan  Vitamin D deficiency - patient is not taking her supplement regularly, but would like her Vitamin D level checked - VITAMIN D 25 Hydroxy (Vit-D Deficiency, Fractures)  Mild benzodiazepine use disorder - Patient has been prescribed Xanax 0.5mg  monthly for years. I confirmed with the [place] Controlled Substance Reporting System. It is prescribed prn, but she takes it nightly as well as prn - Patient became extremely defensive when I asked about her anxiety. She brought up her Xanax prescription multiple times throughout the visit without being prompted. - Patient has sleep apnea and reports multiple falls at home. I do not feel she should remain on Benzodiazepines and have reduced her dose to 0.25mg  and plan to continue to taper down and find a safer alternative for her age and comorbidities.  Generalized anxiety disorder - GAD7 score of 8 today - Patient was resistant and became frustrated when asked to  complete the GAD7. I assured her that this was a tool for my use only to assess and manage her anxiety appropriately.  - Patient insists that Xanax is the only medication that works for her and is resistant to discussing other alternatives for managing her symptoms. Escitalopram, Citalopram and Wellbutrin are listed as allergies, but when asked about the reactions to these medications patient's answers are vague.  Primary osteoarthritis of left knee - discussed that Tylenol is the safest option for OA pain management - meloxicam (MOBIC) 15 MG tablet; Take 1 tablet (15 mg total) by mouth as needed for pain.  - cont to follow-up with [facility]  6. OSA (obstructive sleep apnea) - cont CPAP nightly  7. Hypothyroidism, unspecified type - cont Armour daily - cont to followup with Endocrinology  Patient education and anticipatory guidance given Patient agrees with treatment plan Follow-up in 3 months or sooner as needed  Darlyne Russian PA-C

## 2016-05-13 NOTE — Patient Instructions (Addendum)
I have ordered labs to check your vitamin D, your kidney and liver function, and your blood counts.  Your prescriptions have been sent to your pharmacy Please return in 3 months to check in and refill your medications. Thank you and Happy Holidays!   Osteoporosis Osteoporosis is the thinning and loss of density in the bones. Osteoporosis makes the bones more brittle, fragile, and likely to break (fracture). Over time, osteoporosis can cause the bones to become so weak that they fracture after a simple fall. The bones most likely to fracture are the bones in the hip, wrist, and spine. What are the causes? The exact cause is not known. What increases the risk? Anyone can develop osteoporosis. You may be at greater risk if you have a family history of the condition or have poor nutrition. You may also have a higher risk if you are:  Female.  69 years old or older.  A smoker.  Not physically active.  White or Asian.  Slender. What are the signs or symptoms? A fracture might be the first sign of the disease, especially if it results from a fall or injury that would not usually cause a bone to break. Other signs and symptoms include:  Low back and neck pain.  Stooped posture.  Height loss. How is this diagnosed? To make a diagnosis, your health care provider may:  Take a medical history.  Perform a physical exam.  Order tests, such as:  A bone mineral density test.  A dual-energy X-ray absorptiometry test. How is this treated? The goal of osteoporosis treatment is to strengthen your bones to reduce your risk of a fracture. Treatment may involve:  Making lifestyle changes, such as:  Eating a diet rich in calcium.  Doing weight-bearing and muscle-strengthening exercises.  Stopping tobacco use.  Limiting alcohol intake.  Taking medicine to slow the process of bone loss or to increase bone density.  Monitoring your levels of calcium and vitamin D. Follow these  instructions at home:  Include calcium and vitamin D in your diet. Calcium is important for bone health, and vitamin D helps the body absorb calcium.  Perform weight-bearing and muscle-strengthening exercises as directed by your health care provider.  Do not use any tobacco products, including cigarettes, chewing tobacco, and electronic cigarettes. If you need help quitting, ask your health care provider.  Limit your alcohol intake.  Take medicines only as directed by your health care provider.  Keep all follow-up visits as directed by your health care provider. This is important.  Take precautions at home to lower your risk of falling, such as:  Keeping rooms well lit and clutter free.  Installing safety rails on stairs.  Using rubber mats in the bathroom and other areas that are often wet or slippery. Get help right away if: You fall or injure yourself. This information is not intended to replace advice given to you by your health care provider. Make sure you discuss any questions you have with your health care provider. Document Released: 02/17/2005 Document Revised: 10/13/2015 Document Reviewed: 10/18/2013 Elsevier Interactive Patient Education  2017 [employer].

## 2016-05-14 LAB — COMPREHENSIVE METABOLIC PANEL
ALBUMIN: 3.8 g/dL (ref 3.6–5.1)
ALT: 14 U/L (ref 6–29)
AST: 19 U/L (ref 10–35)
Alkaline Phosphatase: 94 U/L (ref 33–130)
BILIRUBIN TOTAL: 0.3 mg/dL (ref 0.2–1.2)
BUN: 13 mg/dL (ref 7–25)
CO2: 26 mmol/L (ref 20–31)
CREATININE: 0.98 mg/dL (ref 0.50–0.99)
Calcium: 8.9 mg/dL (ref 8.6–10.4)
Chloride: 106 mmol/L (ref 98–110)
Glucose, Bld: 93 mg/dL (ref 65–99)
Potassium: 4.7 mmol/L (ref 3.5–5.3)
SODIUM: 140 mmol/L (ref 135–146)
TOTAL PROTEIN: 6.7 g/dL (ref 6.1–8.1)

## 2016-05-14 LAB — VITAMIN D 25 HYDROXY (VIT D DEFICIENCY, FRACTURES): VIT D 25 HYDROXY: 31 ng/mL (ref 30–100)

## 2016-05-21 ENCOUNTER — Encounter: Payer: Self-pay | Admitting: Physician Assistant

## 2016-05-21 ENCOUNTER — Ambulatory Visit (INDEPENDENT_AMBULATORY_CARE_PROVIDER_SITE_OTHER): Payer: Medicare Other | Admitting: Internal Medicine

## 2016-05-21 ENCOUNTER — Encounter: Payer: Self-pay | Admitting: Internal Medicine

## 2016-05-21 ENCOUNTER — Telehealth: Payer: Self-pay

## 2016-05-21 VITALS — BP 149/60 | HR 44 | Temp 97.9°F | Resp 16 | Ht 67.0 in | Wt 205.0 lb

## 2016-05-21 DIAGNOSIS — E039 Hypothyroidism, unspecified: Secondary | ICD-10-CM

## 2016-05-21 LAB — T3, FREE: T3, Free: 3.3 pg/mL (ref 2.3–4.2)

## 2016-05-21 LAB — T4, FREE: Free T4: 0.58 ng/dL — ABNORMAL LOW (ref 0.60–1.60)

## 2016-05-21 LAB — TSH: TSH: 1.29 u[IU]/mL (ref 0.35–4.50)

## 2016-05-21 NOTE — Telephone Encounter (Signed)
Pt called stating that she is not pleased with her xanax dose being lowered.  She wants to go back on her previous dose.  She reports that she hasn't filled the Rx that was given to her on 05/13/16.  I did let pt that the decision is up to you. Please advise.

## 2016-05-21 NOTE — Telephone Encounter (Signed)
Pt notified of your recommendations below.  She feels that if you don't trust her then she shouldn't trust you. She has threaten to leave the practice. She had an appointment with her endocrinologist today and she discussed the changes you've made with her xanax during that visit. She stated that she (Dr. Cruzita Lederer) also felt her previous dose was a safe dose for her to be on. Historical changes was made to her med list in regards to the xanax. It now says 0.5 instead of 0.25. I suggested that patient make an appointment with you on next Tuesday, she declined.  She stated that if you want to talk to her you can call her. Please advise.

## 2016-05-21 NOTE — Patient Instructions (Addendum)
Please stop at the lab.  Please continue Armour 60 mg 6/7 days and 30 mg 1/7 days.  Take the thyroid hormone every day, with water, at least 30 minutes before breakfast, separated by at least 4 hours from: - acid reflux medications - calcium - iron - multivitamins  Please come back for a follow-up appointment in 6 months.

## 2016-05-21 NOTE — Progress Notes (Signed)
Patient ID: Carol Norton, female   DOB: 1946-03-08, 70 y.o.   MRN: [id number]    HPI  Carol Norton is a 70 y.o.-year-old female, initially referred by Laretta Alstrom, NP, returning for f/u for  hypothyroidism. Last visit 4 mo ago.  Reviewed hx: Pt. has been dx with hypothyroidism in 1990 >> on Levothyroxine initially, then tried Armour >> then back to Levothyroxine >> increased hair loss, fatigue, SOB, HAs >> switched to Armour 60 mg daily >> feels much better now: Less fatigue, more active, no HA.   She takes the thyroid hormone: - fasting, chews it - with water - separated by >2 hrs from b'fast  - no calcium, iron, multivitamins  - very seldom Tums, PPIs - later in the day - on Biotin SR - taken at night >> stopped since last visit  At last visit, as her TSH was slightly suppressed, we decreased Armour to 1/2 tab 1/7 days.  I reviewed pt's thyroid tests: 12/15/2015: TSH 0.060 on LT4 100 >> decreased LT4 to 88 >> then switched to Armour 60 mg 10/21/2015: 0.285 09/12/2015: 0.941 Lab Results  Component Value Date   TSH 0.23 (L) 02/02/2016   TSH 0.73 08/12/2015   TSH 1.090 05/13/2015   TSH 0.220 (L) 04/09/2015   TSH 0.143 (L) 02/25/2015   TSH 1.231 12/17/2014   FREET4 0.61 02/02/2016   FREET4 0.97 04/09/2015   FREET4 0.85 02/25/2015   FREET4 0.71 (L) 12/17/2014    Component     Latest Ref Rng & Units 02/02/2016  Thyroglobulin Ab     <2 IU/mL <1  Thyroperoxidase Ab SerPl-aCnc     <9 IU/mL 2   Pt describes: - no fatigue  - + hair loss - + weight gain (not by our scale) - no Heat intolerance - + anxiety, no depression - no constipation - No dry skin - + hair loss  Pt denies feeling nodules in neck, hoarseness, dysphagia/odynophagia, SOB with lying down.  She has + FH of thyroid disorders in: M and sister. No FH of thyroid cancer.  No h/o radiation tx to head or neck. No recent use of iodine supplements.  She takes vit D 100,000 IU weekly  ROS: Constitutional: + See  history of present illness Eyes: no blurry vision, no xerophthalmia ENT: no sore throat, no nodules palpated in throat, no dysphagia/odynophagia, no hoarseness Cardiovascular: no CP/SOB/palpitations/leg swelling Respiratory: no cough/SOB Gastrointestinal: no N/V/D/C/+ heartburn Musculoskeletal: no muscle/+ joint aches Skin: no rashes, + hair loss Neurological: no tremors/numbness/tingling/dizziness  I reviewed pt's medications, allergies, PMH, social hx, family hx, and changes were documented in the history of present illness. Otherwise, unchanged from my initial visit note.  Past Medical History:  Diagnosis Date  . Allergic rhinitis   . Allergy   . Anxiety   . Chronic constipation   . Chronic headache   . Chronic sinusitis   . GERD (gastroesophageal reflux disease)   . Glaucoma   . Glaucoma   . Hyperlipemia   . Hypothyroid   . Ocular rosacea   . Sebaceous cyst   . Sleep apnea   . Sleep apnea   . Vitamin D deficiency    Past Surgical History:  Procedure Laterality Date  . ABDOMINAL HYSTERECTOMY    . BREAST SURGERY     sebaceous cyst  . FACIAL COSMETIC SURGERY     1995  . GANGLION CYST EXCISION    . MENISCUS REPAIR Left   . RHINOPLASTY    . SEPTOPLASTY    .  TUBAL LIGATION     Social History   Social History  . Marital status: Married    Spouse name: N/A  . Number of children: 2  . Years of education: 13   Occupational History  . retired    Social History Main Topics  . Smoking status: Former Smoker    Packs/day: 0.75    Years: 25.00    Types: Cigarettes    Quit date: 05/25/2007  . Smokeless tobacco: Never Used     Comment: quit for 10 years in the total 1yrs.   . Alcohol use No     Comment: rare  . Drug use: No  . Sexual activity: Not on file   Other Topics Concern  . Not on file   Social History Narrative   Patient has recently quit drinking caffeine.   Patient is right handed.    Current Outpatient Prescriptions on File Prior to Visit   Medication Sig Dispense Refill  . ALPRAZolam (XANAX) 0.25 MG tablet Take 1 tablet (0.25 mg total) by mouth at bedtime as needed (Do not exceed 3 tabs in a 24 hour period). for sleep 30 tablet 2  . BIOTIN PO Take 1 capsule by mouth daily. Plus folic acid    . clindamycin (CLEOCIN) 300 MG capsule Take 1 capsule (300 mg total) by mouth 3 (three) times daily. (Patient not taking: Reported on 05/13/2016) 30 capsule 0  . doxycycline (VIBRA-TABS) 100 MG tablet Take 0.5 tablets (50 mg total) by mouth 2 (two) times daily. 20 tablet 0  . fexofenadine (ALLEGRA) 180 MG tablet Take 180 mg by mouth as needed.     . Magnesium 100 MG CAPS Take by mouth.    . meloxicam (MOBIC) 15 MG tablet Take 1 tablet (15 mg total) by mouth as needed for pain. 30 tablet 3  . Methylsulfonylmethane (MSM PO) Take 1 tablet by mouth daily.    Marland Kitchen OVER THE COUNTER MEDICATION Stool softner    . Polyethylene Glycol 400 (BLINK TEARS OP) Apply to eye at bedtime as needed.    . Senna-Psyllium (PERDIEM PO) Take 3 tablets by mouth at bedtime.    Marland Kitchen thyroid (ARMOUR) 60 MG tablet Take 60 mg by mouth.    . Vitamin D, Ergocalciferol, (DRISDOL) 50000 units CAPS capsule Take 2 capsules (100,000 Units total) by mouth every 7 (seven) days. (Patient not taking: Reported on 05/13/2016) 24 capsule 0   No current facility-administered medications on file prior to visit.    Allergies  Allergen Reactions  . Avelox [Moxifloxacin Hcl In Nacl]   . Citalopram   . Crestor [Rosuvastatin]   . Dexilant [Dexlansoprazole]   . Escitalopram Oxalate   . Statins     Myalgias, joint pain.  . Wellbutrin [Bupropion]    Family History  Problem Relation Age of Onset  . Emphysema Father   . Allergies Mother   . Heart disease Mother   . Ovarian cancer Mother   . Hyperlipidemia Mother   . Hypothyroidism Mother   . Hypothyroidism Sister    PE: BP (!) 149/60   Pulse 74   Temp 97.9 F (36.6 C) (Oral)   Resp 16   Ht 5\' 7"  (1.702 m)   Wt 205 lb (93 kg)    BMI 32.11 kg/m  Wt Readings from Last 3 Encounters:  05/21/16 205 lb (93 kg)  05/13/16 205 lb (93 kg)  01/20/16 207 lb (93.9 kg)   Constitutional: overweight, in NAD Eyes: PERRLA, EOMI, no exophthalmos ENT: moist  mucous membranes, no thyromegaly but slight L lobe prominence, no cervical lymphadenopathy Cardiovascular: RRR, No MRG Respiratory: CTA B Gastrointestinal: abdomen soft, NT, ND, BS+ Musculoskeletal: no deformities, strength intact in all 4 Skin: moist, warm, no rashes Neurological: no tremor with outstretched hands, DTR normal in all 4  ASSESSMENT: 1. Hypothyroidism  PLAN:  1. Patient with long-standing hypothyroidism, on Armour therapy. She is now on 60 mg of Armour 6/7 days and 30 mg 1/7 days. She feels much better on Armour, with improved fatigue, weight, and no more headaches compared to her previous regimen of levothyroxine. Will continue with Armour. - We discussed about correct intake of thyroid hormone, fasting, with water, separated by at least 30 minutes from breakfast, and separated by more than 4 hours from calcium, iron, multivitamins, acid reflux medications (PPIs). She is taking it correctly. - will check thyroid tests: TSH, free T4, free T3 today - reviewed last visit's ATA and TPO antibodies >> normal >> no sign of Hashimoto's thyroiditis.  - she was on Biotin >> stopped at last visit - If labs today are abnormal, she will need to return in 6 weeks for repeat labs - Otherwise, I will see her back in 6 months  Component     Latest Ref Rng & Units 05/21/2016  TSH     0.35 - 4.50 uIU/mL 1.29  T4,Free(Direct)     0.60 - 1.60 ng/dL 0.58 (L)  Triiodothyronine,Free,Serum     2.3 - 4.2 pg/mL 3.3  Labs are normal. We'll continue the current dose of Armour.  [name], MD PhD [facility] Endocrinology

## 2016-05-21 NOTE — Telephone Encounter (Signed)
Please let patient know that it is neither safe nor the standard of care for anxiety for her to be on this medication daily. This medication is meant to be taken on an as needed basis for breakthrough anxiety / panic attacks. I am happy to manage her anxiety with safer options, but I will not increase the dose.

## 2016-05-26 ENCOUNTER — Ambulatory Visit (INDEPENDENT_AMBULATORY_CARE_PROVIDER_SITE_OTHER): Payer: Medicare Other | Admitting: Family Medicine

## 2016-05-26 ENCOUNTER — Telehealth: Payer: Self-pay | Admitting: Family Medicine

## 2016-05-26 ENCOUNTER — Encounter: Payer: Self-pay | Admitting: Family Medicine

## 2016-05-26 VITALS — BP 140/87 | Temp 98.5°F | Resp 20 | Ht 67.0 in | Wt 203.0 lb

## 2016-05-26 DIAGNOSIS — G4733 Obstructive sleep apnea (adult) (pediatric): Secondary | ICD-10-CM

## 2016-05-26 DIAGNOSIS — E039 Hypothyroidism, unspecified: Secondary | ICD-10-CM | POA: Diagnosis not present

## 2016-05-26 DIAGNOSIS — F411 Generalized anxiety disorder: Secondary | ICD-10-CM

## 2016-05-26 DIAGNOSIS — E559 Vitamin D deficiency, unspecified: Secondary | ICD-10-CM | POA: Diagnosis not present

## 2016-05-26 DIAGNOSIS — G4730 Sleep apnea, unspecified: Secondary | ICD-10-CM | POA: Insufficient documentation

## 2016-05-26 DIAGNOSIS — Z7689 Persons encountering health services in other specified circumstances: Secondary | ICD-10-CM

## 2016-05-26 MED ORDER — VITAMIN D (ERGOCALCIFEROL) 1.25 MG (50000 UNIT) PO CAPS: 50000.0000 [IU] | ORAL_CAPSULE | ORAL | 3 refills | Status: DC

## 2016-05-26 MED ORDER — ALPRAZOLAM 0.5 MG PO TABS: 0.5000 mg | ORAL_TABLET | Freq: Every evening | ORAL | 2 refills | Status: DC | PRN

## 2016-05-26 NOTE — Progress Notes (Signed)
Patient ID: Carol Norton, female  DOB: 1946-01-27, 71 y.o.   MRN: [id number] Patient Care Team    Relationship Specialty Notifications Start End  [name], DO PCP - General Family Medicine  05/26/16   [name], MD Consulting Physician Orthopedic Surgery  03/11/16   [name], NP Nurse Practitioner Nurse Practitioner  05/26/16   [name], MD Consulting Physician Internal Medicine  05/26/16    Comment: endocrine, thyroid  Kathee Delton, MD Referring Physician Pulmonary Disease  05/26/16   [name], MD Consulting Physician Neurology  05/26/16   [name], MD Consulting Physician Cardiology  05/26/16   [name], MD Consulting Physician Ophthalmology  05/26/16    Comment: Dr Kathlen Mody at [facility] for glaucoma and cataracts every 3 mos  [name], MD Attending Physician Family Medicine  05/26/16    Comment: [facility]    Subjective:  Carol Norton is a 71 y.o.  female present for new patient establishment. All past medical history, surgical history, allergies, family history, immunizations, medications and social history were obtained and entered  in the electronic medical record today. All recent labs, ED visits and hospitalizations within the last year were reviewed. Pt was a prior Pt of Dr. Ileene Rubens, which has moved to [place] area.   Her main concerns today are surrounding her Vitamin D level and anxiety.   Vitamin D deficiency: Patient reports history of vitamin D deficiency. She has been prescribed 50,000 units 2 times a week which brought her vitamin D to low normal. She does not understand why she is unable to keep her vitamin D normal without supplementation.  Hypothyroid: Patient is currently on Armour Thyroid 60 mg daily, and is feeling well. She follows with endocrine (Dr.Gherge) for this condition. She reports compliance with medications, she takes first thing in the morning on empty stomach. She states her thyroid has been somewhat difficult to  control in the past, so she started seeing endocrinology recently.  Anxiety with insomnia/OSA with CPAP: Patient is prescribed Xanax 0.5 mg daily at bedtime when necessary. She states that sometimes she takes half a tab sometimes she needs a full tab to help her relax . She does endorse nightly use. Patient has a lot of worry and anxiety surrounding the continuation of this medicine. She reports it is what works for her, she has been on it for over 20 years and she would like to be able to continue it. She reports trying other medications for anxiety in the past, but states that there is a side effect to the medication she is likely to get. She denies any side effects to Xanax use. She denies any falls in the past year. She denies any over sedation with use. Pt reports use of CPAP nightly and has machine/settings adjusted per [employer].  Health maintenance: preventive exam completed 05/13/2016 at [place].  Colonoscopy: desired cologuard by preventive appt.  Mammogram: ordered in 04/2016 by [place]. 01/2012 [facility] bir[facility] 1.  Immunizations: tdap UTD 2015, Influenza 02/2016 (encouraged yearly), PNA-13 11/2013, zostavax completed Infectious disease screening:  Hep C not completed.  DEXA: declined in 04/2016. Last dexa 2009.  Immunization History  Administered Date(s) Administered  . Influenza Split 02/27/2014  . Influenza-Unspecified 02/06/2015, 02/19/2015, 02/22/2016  . Pneumococcal Conjugate-13 12/12/2013  . Pneumococcal Polysaccharide-23 05/24/2008  . Tdap 12/12/2013  . Zoster 02/12/2008, 05/24/2008    Past Medical History:  Diagnosis Date  . Allergic rhinitis   . Allergy   . Anxiety   .  Cataract   . Chronic constipation   . Chronic headache   . Chronic sinusitis   . GERD (gastroesophageal reflux disease)   . Glaucoma   . Hyperlipemia   . Hypothyroid   . Ocular rosacea   . Sebaceous cyst   . Sleep apnea    on CPAP  . Vitamin D deficiency    Allergies  Allergen Reactions  . Avelox  [Moxifloxacin Hcl In Nacl]   . Citalopram   . Crestor [Rosuvastatin]   . Dexilant [Dexlansoprazole]   . Escitalopram Oxalate   . Statins     Myalgias, joint pain.  . Wellbutrin [Bupropion]    Past Surgical History:  Procedure Laterality Date  . ABDOMINAL HYSTERECTOMY  1983  . BREAST SURGERY     sebaceous cyst  . FACIAL COSMETIC SURGERY     1995  . GANGLION CYST EXCISION    . MENISCUS REPAIR Left   . RHINOPLASTY    . SEPTOPLASTY    . TUBAL LIGATION     Family History  Problem Relation Age of Onset  . Emphysema Father   . Alcohol abuse Father   . Allergies Mother   . Heart disease Mother   . Ovarian cancer Mother   . Hyperlipidemia Mother   . Hypothyroidism Mother   . Hypothyroidism Sister   . Lung cancer Paternal Aunt   . Diabetes Maternal Grandmother   . Heart disease Maternal Grandmother   . Stroke Paternal Grandfather    Social History   Social History  . Marital status: Married    Spouse name: N/A  . Number of children: 2  . Years of education: 13   Occupational History  . retired    Social History Main Topics  . Smoking status: Former Smoker    Packs/day: 0.75    Years: 25.00    Types: Cigarettes    Quit date: 05/25/2007  . Smokeless tobacco: Never Used     Comment: quit for 10 years in the total 54yrs.   . Alcohol use No     Comment: rare  . Drug use: No  . Sexual activity: Yes    Birth control/ protection: Surgical   Other Topics Concern  . Not on file   Social History Narrative   Patient has recently quit drinking caffeine.   Patient is right handed.    Allergies as of 05/26/2016      Reactions   Avelox [moxifloxacin Hcl In Nacl]    Citalopram    Crestor [rosuvastatin]    Dexilant [dexlansoprazole]    Escitalopram Oxalate    Statins    Myalgias, joint pain.   Wellbutrin [bupropion]       Medication List       Accurate as of 05/26/16 12:51 PM. Always use your most recent med list.          ALPRAZolam 0.5 MG tablet Commonly  known as:  XANAX Take 1 tablet (0.5 mg total) by mouth at bedtime as needed.   Biotin 10 MG Caps Take by mouth.   BLINK TEARS OP Apply to eye at bedtime as needed.   doxycycline 100 MG tablet Commonly known as:  VIBRA-TABS Take 100 mg by mouth daily.   Magnesium 100 MG Caps Take by mouth.   meloxicam 15 MG tablet Commonly known as:  MOBIC Take 1 tablet (15 mg total) by mouth as needed for pain.   MSM PO Take 1 tablet by mouth daily.   OVER THE COUNTER MEDICATION Stool  softner   PERDIEM PO Take 3 tablets by mouth at bedtime.   thyroid 60 MG tablet Commonly known as:  ARMOUR Take 60 mg by mouth.   Vitamin D (Ergocalciferol) 50000 units Caps capsule Commonly known as:  DRISDOL Take 1 capsule (50,000 Units total) by mouth every 7 (seven) days.        Recent Results (from the past 2160 hour(s))  CBC     Status: None   Collection Time: 05/13/16  2:00 PM  Result Value Ref Range   WBC 8.2 3.8 - 10.8 K/uL   RBC 4.62 3.80 - 5.10 MIL/uL   Hemoglobin 14.3 11.7 - 15.5 g/dL   HCT 42.9 35.0 - 45.0 %   MCV 92.9 80.0 - 100.0 fL   MCH 31.0 27.0 - 33.0 pg   MCHC 33.3 32.0 - 36.0 g/dL   RDW 14.2 11.0 - 15.0 %   Platelets 271 140 - 400 K/uL   MPV 11.1 7.5 - 12.5 fL  Comprehensive metabolic panel     Status: None   Collection Time: 05/13/16  2:00 PM  Result Value Ref Range   Sodium 140 135 - 146 mmol/L   Potassium 4.7 3.5 - 5.3 mmol/L   Chloride 106 98 - 110 mmol/L   CO2 26 20 - 31 mmol/L   Glucose, Bld 93 65 - 99 mg/dL   BUN 13 7 - 25 mg/dL   Creat 0.98 0.50 - 0.99 mg/dL    Comment:   For patients > or = 70 years of age: The upper reference limit for Creatinine is approximately 13% higher for people identified as African-American.      Total Bilirubin 0.3 0.2 - 1.2 mg/dL   Alkaline Phosphatase 94 33 - 130 U/L   AST 19 10 - 35 U/L   ALT 14 6 - 29 U/L   Total Protein 6.7 6.1 - 8.1 g/dL   Albumin 3.8 3.6 - 5.1 g/dL   Calcium 8.9 8.6 - 10.4 mg/dL  VITAMIN D 25  Hydroxy (Vit-D Deficiency, Fractures)     Status: None   Collection Time: 05/13/16  2:00 PM  Result Value Ref Range   Vit D, 25-Hydroxy 31 30 - 100 ng/mL    Comment: Vitamin D Status           25-OH Vitamin D        Deficiency                <20 ng/mL        Insufficiency         20 - 29 ng/mL        Optimal             > or = 30 ng/mL   For 25-OH Vitamin D testing on patients on D2-supplementation and patients for whom quantitation of D2 and D3 fractions is required, the QuestAssureD 25-OH VIT D, (D2,D3), LC/MS/MS is recommended: order code [id number] (patients > 2 yrs).   T4, free     Status: Abnormal   Collection Time: 05/21/16 11:22 AM  Result Value Ref Range   Free T4 0.58 (L) 0.60 - 1.60 ng/dL    Comment: Specimens from patients who are undergoing biotin therapy and /or ingesting biotin supplements may contain high levels of biotin.  The higher biotin concentration in these specimens interferes with this Free T4 assay.  Specimens that contain high levels  of biotin may cause false high results for this Free T4 assay.  Please interpret results  in light of the total clinical presentation of the patient.    T3, free     Status: None   Collection Time: 05/21/16 11:22 AM  Result Value Ref Range   T3, Free 3.3 2.3 - 4.2 pg/mL  TSH     Status: None   Collection Time: 05/21/16 11:22 AM  Result Value Ref Range   TSH 1.29 0.35 - 4.50 uIU/mL    ROS: 14 pt review of systems performed and negative (unless mentioned in an HPI)  Objective: BP 140/87 (BP Location: Left Arm, Patient Position: Sitting, Cuff Size: Large)   Temp 98.5 F (36.9 C)   Resp 20   Ht 5\' 7"  (1.702 m)   Wt 203 lb (92.1 kg)   SpO2 98%   BMI 31.79 kg/m  Gen: Afebrile. No acute distress. Nontoxic in appearance, well-developed, well-nourished,  Obese, anxious Caucasian female. HENT: AT. [place].  MMM Eyes:Pupils Equal Round Reactive to light, Extraocular movements intact,  Conjunctiva without redness, discharge or  icterus. Neck/lymp/endocrine: Supple,no lymphadenopathy, no thyromegaly CV: RRR, no edema Chest: CTAB, no wheeze, rhonchi or crackles.  Abd: Soft. NTND. BS present Skin: Warm and well-perfused. Skin intact. Neuro/Msk:  Alert. Oriented x3.   Psych: anxious. Otherwise Normal affect, dress and demeanor. Normal speech. Normal thought content and judgment.   Assessment/plan: Carol Norton is a 71 y.o. female present for new pt establishment Encounter to establish care with new doctor Generalized anxiety disorder - Patient's prior PCP moved. Patient did have preventative visit in December 2017 with new provider at her PCPs office, but has since decided to move locations. I suspect this was secondary to this provider not desiring to keep prescribing patient Xanax. Lengthy discussion with patient and her husband today surrounding use of Xanax and its potential side effects, BEERS criteria, label as a controlled substance and requirements for follow-up/refills on this medication. - Discussed with patient in detail the addictive properties of Xanax. - Discussed with patient and her husband, I would be willing to continue 0.5 mg daily at bedtime when necessary, as long as they are completely aware and educated on side effects, addictive properties, potential decrease in dose or discontinuation as patient ages, and protocol for follow-ups on this medication. He must be honest, and report any side effects experienced on medications. They understand that they need to watch for oversedation, falls or confusion. They understands she will need to follow-up in person anytime she needs refills, past which was given on printed prescription.  - Again reiterated this is not an ideal medication in the geriatric population and it is a controlled substance, therefore requires education surrounding medicine and in person follow-up. Patient and husband verbalized understanding.  OSA (obstructive sleep apnea) [facility] services   Machine Patient reports compliance with use  Acquired hypothyroidism Follows with Dr. Renne Crigler  Vitamin D deficiency - Discussed potential causes of lack of absorption of vitamin D, continue 50,000 units weekly and will retest in 12 weeks. She reported being on 50,000 units 2 times a week, but frequently forgot second dose and vitamin D was low normal on recheck at 31. - Vitamin D, Ergocalciferol, (DRISDOL) 50000 units CAPS capsule; Take 1 capsule (50,000 Units total) by mouth every 7 (seven) days.  Dispense: 12 capsule; Refill: 3   CPE due after [id number], uncertain if she has had a MW visit.  Return if symptoms worsen or fail to improve.  Electronically signed by: Howard Pouch, DO [place]

## 2016-05-26 NOTE — Patient Instructions (Signed)
It was a pleasure meeting you today.  Follow as needed for anxiety medication, yearly visits and sick visit.    Please help [place] help you:  It is a privilege to be able to take care of great patients such as yourself. We are honored you have chosen [facility] for your Primary Care home. Below you will find basic instructions that you may need to access in the future. Please help [place] help you by reading the instructions, which cover many of the frequent questions we experience.   Prescription refills and request:  -In order to allow more efficient response time, please call your pharmacy for all refills. They will forward the request electronically to [place]. This allows for the quickest possible response. Request left on a nurse line can take longer to refill, since these are checked as time allows between office patients and other phone calls.  - refill request can take up to 3-5 working days to complete.  - If request is sent electronically and request is appropiate, it is usually completed in 1-2 business days.  - all patients will need to be seen routinely for all chronic medical conditions requiring prescription medications (see follow-up below). If you are overdue for follow up on your condition, you will be asked to make an appointment and we will call in enough medication to cover you until your appointment (up to 30 days).  - all controlled substances will require a face to face visit to request/refill.  - if you desire your prescriptions to go through a new pharmacy, and have an active script at original pharmacy, you will need to call your pharmacy and have scripts transferred to new pharmacy. This is completed between the pharmacy locations and not by your provider.    Results: If any images or labs were ordered, it can take up to 1 week to get results depending on the test ordered and the lab/facility running and resulting the test. - Normal or stable results, which do not need further  discussion, will be released to your mychart immediately with attached note to you. A call will not be generated for normal results. Please make certain to sign up for mychart. If you have questions on how to activate your mychart you can call the front office.  - If your results need further discussion, our office will attempt to contact you via phone, and if unable to reach you after 2 attempts, we will release your abnormal result to your mychart with instructions.  - All results will be automatically released in mychart after 1 week.  - Your provider will provide you with explanation and instruction on all relevant material in your results. Please keep in mind, results and labs may appear confusing or abnormal to the untrained eye, but it does not mean they are actually abnormal for you personally. If you have any questions about your results that are not covered, or you desire more detailed explanation than what was provided, you should make an appointment with your provider to do so.   Our office handles many outgoing and incoming calls daily. If we have not contacted you within 1 week about your results, please check your mychart to see if there is a message first and if not, then contact our office.  In helping with this matter, you help decrease call volume, and therefore allow [place] to be able to respond to patients needs more efficiently.   Acute office visits (sick visit):  An acute visit  is intended for a new problem and are scheduled in shorter time slots to allow schedule openings for patients with new problems. This is the appropriate visit to discuss a new problem. In order to provide you with excellent quality medical care with proper time for you to explain your problem, have an exam and receive treatment with instructions, these appointments should be limited to one new problem per visit. If you experience a new problem, in which you desire to be addressed, please make an acute office visit,  we save openings on the schedule to accommodate you. Please do not save your new problem for any other type of visit, let us take care of it properly and quickly for you.   Follow up visits:  Depending on your condition(s) your provider will need to see you routinely in order to provide you with quality care and prescribe medication(s). Most chronic conditions (Example: hypertension, Diabetes, depression/anxiety... etc), require visits a couple times a year. Your provider will instruct you on proper follow up for your personal medical conditions and history. Please make certain to make follow up appointments for your condition as instructed. Failing to do so could result in lapse in your medication treatment/refills. If you request a refill, and are overdue to be seen on a condition, we will always provide you with a 30 day script (once) to allow you time to schedule.    Medicare wellness (well visit): - we have a wonderful Nurse Maudie Mercury), that will meet with you and provide you will yearly medicare wellness visits. These visits should occur yearly (can not be scheduled less than 1 calendar year apart) and cover preventive health, immunizations, advance directives and screenings you are entitled to yearly through your medicare benefits. Do not miss out on your entitled benefits, this is when medicare will pay for these benefits to be ordered for you.  These are strongly encouraged by your provider and is the appropriate type of visit to make certain you are up to date with all preventive health benefits. If you have not had your medicare wellness exam in the last 12 months, please make certain to schedule one by calling the office and schedule your medicare wellness with Maudie Mercury as soon as possible.   Yearly physical (well visit):  - Adults are recommended to be seen yearly for physicals. Check with your insurance and date of your last physical, most insurances require one calendar year between physicals.  Physicals include all preventive health topics, screenings, medical exam and labs that are appropriate for gender/age and history. You may have fasting labs needed at this visit. This is a well visit (not a sick visit), acute topics should not be covered during this visit.  - Pediatric patients are seen more frequently when they are younger. Your provider will advise you on well child visit timing that is appropriate for your their age. - This is not a medicare wellness visit. Medicare wellness exams do not have an exam portion to the visit. Some medicare companies allow for a physical, some do not allow a yearly physical. If your medicare allows a yearly physical you can schedule the medicare wellness with our nurse Maudie Mercury and have your physical with your provider after, on the same day. Please check with insurance for your full benefits.   Late Policy/No Shows:  - all new patients should arrive 15-30 minutes earlier than appointment to allow [place] time  to  obtain all personal demographics,  insurance information and for you to  complete office paperwork. - All established patients should arrive 10-15 minutes earlier than appointment time to update all information and be checked in .  - In our best efforts to run on time, if you are late for your appointment you will be asked to either reschedule or if able, we will work you back into the schedule. There will be a wait time to work you back in the schedule,  depending on availability.  - If you are unable to make it to your appointment as scheduled, please call 24 hours ahead of time to allow [place] to fill the time slot with someone else who needs to be seen. If you do not cancel your appointment ahead of time, you may be charged a no show fee.

## 2016-05-26 NOTE — Telephone Encounter (Signed)
Please call patient: It does appear that her appointment in December with the other provider was a preventative/annual exam. Documentation of exam, labs and orders of preventative screenings such as mammogram and Cologuard all ordered, therefore that was her preventative. - I am not certain if she's had a Medicare wellness, this can be looked into by our staff before calling her, to see if she would need scheduling. She would need to be aware that there is no exam and this is completed by nurse, before she schedules.

## 2016-05-27 NOTE — Telephone Encounter (Signed)
Patient is aware that she had a preventive annual wellness exam. Maudie Mercury, RN is looking into when she is due for medicare wellness and contact patient to schedule.

## 2016-06-14 ENCOUNTER — Other Ambulatory Visit: Payer: Self-pay | Admitting: Internal Medicine

## 2016-07-23 ENCOUNTER — Encounter: Payer: Self-pay | Admitting: Family Medicine

## 2016-07-23 ENCOUNTER — Ambulatory Visit (INDEPENDENT_AMBULATORY_CARE_PROVIDER_SITE_OTHER): Payer: Medicare Other | Admitting: Family Medicine

## 2016-07-23 ENCOUNTER — Telehealth: Payer: Self-pay | Admitting: Family Medicine

## 2016-07-23 VITALS — BP 143/86 | HR 81 | Temp 97.0°F | Resp 20 | Wt 202.5 lb

## 2016-07-23 DIAGNOSIS — R1013 Epigastric pain: Secondary | ICD-10-CM | POA: Diagnosis not present

## 2016-07-23 LAB — COMPREHENSIVE METABOLIC PANEL
ALBUMIN: 4.2 g/dL (ref 3.5–5.2)
ALK PHOS: 88 U/L (ref 39–117)
ALT: 17 U/L (ref 0–35)
AST: 19 U/L (ref 0–37)
BILIRUBIN TOTAL: 0.4 mg/dL (ref 0.2–1.2)
BUN: 15 mg/dL (ref 6–23)
CALCIUM: 9.3 mg/dL (ref 8.4–10.5)
CHLORIDE: 106 meq/L (ref 96–112)
CO2: 27 mEq/L (ref 19–32)
Creatinine, Ser: 0.95 mg/dL (ref 0.40–1.20)
GFR: 61.81 mL/min (ref >60.00)
Glucose, Bld: 111 mg/dL — ABNORMAL HIGH (ref 70–99)
Potassium: 4.4 mEq/L (ref 3.5–5.1)
Sodium: 139 mEq/L (ref 135–145)
TOTAL PROTEIN: 6.7 g/dL (ref 6.0–8.3)

## 2016-07-23 LAB — H. PYLORI ANTIBODY, IGG: H Pylori IgG: NEGATIVE

## 2016-07-23 LAB — LIPASE: LIPASE: 14 U/L (ref 11.0–59.0)

## 2016-07-23 MED ORDER — OMEPRAZOLE 40 MG PO CPDR: 40.0000 mg | DELAYED_RELEASE_CAPSULE | Freq: Every day | ORAL | 3 refills | Status: DC

## 2016-07-23 NOTE — Patient Instructions (Signed)
Watch for reflux triggers. Do not eat and then lay flat.   Take omeperazole daily.  I will call you with lab results and add medicine if needed.   Start ALIGN probiotic daily.   Keep bowel movements soft, but not diarrhea.     Food Choices for Gastroesophageal Reflux Disease, Adult When you have gastroesophageal reflux disease (GERD), the foods you eat and your eating habits are very important. Choosing the right foods can help ease your discomfort. What guidelines do I need to follow?  Choose fruits, vegetables, whole grains, and low-fat dairy products.  Choose low-fat meat, fish, and poultry.  Limit fats such as oils, salad dressings, butter, nuts, and avocado.  Keep a food diary. This helps you identify foods that cause symptoms.  Avoid foods that cause symptoms. These may be different for everyone.  Eat small meals often instead of 3 large meals a day.  Eat your meals slowly, in a place where you are relaxed.  Limit fried foods.  Cook foods using methods other than frying.  Avoid drinking alcohol.  Avoid drinking large amounts of liquids with your meals.  Avoid bending over or lying down until 2-3 hours after eating. What foods are not recommended? These are some foods and drinks that may make your symptoms worse: Vegetables  Tomatoes. Tomato juice. Tomato and spaghetti sauce. Chili peppers. Onion and garlic. Horseradish. Fruits  Oranges, grapefruit, and lemon (fruit and juice). Meats  High-fat meats, fish, and poultry. This includes hot dogs, ribs, ham, sausage, salami, and bacon. Dairy  Whole milk and chocolate milk. Sour cream. Cream. Butter. Ice cream. Cream cheese. Drinks  Coffee and tea. Bubbly (carbonated) drinks or energy drinks. Condiments  Hot sauce. Barbecue sauce. Sweets/Desserts  Chocolate and cocoa. Donuts. Peppermint and spearmint. Fats and Oils  High-fat foods. This includes [place] fries and potato chips. Other  Vinegar. Strong spices. This  includes black pepper, white pepper, red pepper, cayenne, curry powder, cloves, ginger, and chili powder. The items listed above may not be a complete list of foods and drinks to avoid. Contact your dietitian for more information.  This information is not intended to replace advice given to you by your health care provider. Make sure you discuss any questions you have with your health care provider. Document Released: 11/09/2011 Document Revised: 10/16/2015 Document Reviewed: 03/14/2013 Elsevier Interactive Patient Education  2017 [employer].

## 2016-07-23 NOTE — Progress Notes (Signed)
Carol Norton , Sep 26, 1945, 71 y.o., female MRN: [id number] Patient Care Team    Relationship Specialty Notifications Start End  [name], DO PCP - General Family Medicine  05/26/16   [name], MD Consulting Physician Orthopedic Surgery  03/11/16   [name], NP Nurse Practitioner Nurse Practitioner  05/26/16   [name], MD Consulting Physician Internal Medicine  05/26/16    Comment: endocrine, thyroid  Kathee Delton, MD Referring Physician Pulmonary Disease  05/26/16   [name], MD Consulting Physician Neurology  05/26/16   [name], MD Consulting Physician Cardiology  05/26/16   [name], MD Consulting Physician Ophthalmology  05/26/16    Comment: Dr Kathlen Mody at [facility] for glaucoma and cataracts every 3 mos  [name], MD Attending Physician Family Medicine  05/26/16    Comment: [facility]    CC: Gastric discomfort Subjective: Pt presents for an  OV with complaints of epigastric discomfort of about 2 weeks duration.  Associated symptoms include nausea, bloating and chronic constipation. She denies fever, chills, vomiting, chronic cough, heartburn or diarrhea. Patient states that she has had a history of gastric reflux in the past and was on Dexilant. She has never had an EGD that she recalls. She states she started miralax about 5 days ago and her bowels have since been moving normally. She has always had chronic constipation and sometimes uses miralax and sometimes needs to use a senna product. She started Nexium 20 mg daily about 10 days ago and feels mildly improved, but symptoms are not resolved. She switched over to Prilosec 20 mg today. She reports not watching her diet very closely eating poorly over the last few weeks. She also endorses increased stress over the last few weeks. She endorses eating before lying down to the bed. Her last colonoscopy was completed around 2006 and was normal per her report, it was completed in [place]. She used to take a  probiotic, but she hasn't been taking that routinely.  Depression screen PHQ 2/9 05/26/2016  Decreased Interest 0  Down, Depressed, Hopeless 0  PHQ - 2 Score 0    Allergies  Allergen Reactions  . Avelox [Moxifloxacin Hcl In Nacl]   . Citalopram   . Crestor [Rosuvastatin]   . Dexilant [Dexlansoprazole]   . Escitalopram Oxalate   . Statins     Myalgias, joint pain.  . Wellbutrin [Bupropion]    Social History  Substance Use Topics  . Smoking status: Former Smoker    Packs/day: 0.75    Years: 25.00    Types: Cigarettes    Quit date: 05/25/2007  . Smokeless tobacco: Never Used     Comment: quit for 10 years in the total 93yr.   . Alcohol use No     Comment: rare   Past Medical History:  Diagnosis Date  . Allergic rhinitis   . Allergy   . Anxiety   . Bilateral carpal tunnel syndrome    Patient scheduled for surgery with neurology  . Cataract   . Chronic constipation   . Chronic headache   . Chronic sinusitis   . GERD (gastroesophageal reflux disease)   . Glaucoma   . Hyperlipemia   . Hypothyroid   . Leg length discrepancy   . Ocular rosacea   . Plantar fasciitis   . Sebaceous cyst   . Sleep apnea    on CPAP  . Urethral stenosis   . Vitamin D deficiency    Past Surgical  History:  Procedure Laterality Date  . ABDOMINAL HYSTERECTOMY  1983  . BREAST SURGERY     sebaceous cyst  . FACIAL COSMETIC SURGERY     1995  . GANGLION CYST EXCISION    . MENISCUS REPAIR Left   . RHINOPLASTY    . SEPTOPLASTY    . TUBAL LIGATION     Family History  Problem Relation Age of Onset  . Emphysema Father   . Alcohol abuse Father   . Allergies Mother   . Heart disease Mother   . Ovarian cancer Mother   . Hyperlipidemia Mother   . Hypothyroidism Mother   . Arthritis Mother   . Hypothyroidism Sister   . Lung cancer Paternal Aunt   . Diabetes Maternal Grandmother   . Heart disease Maternal Grandmother   . Stroke Paternal Grandfather    Allergies as of 07/23/2016       Reactions   Avelox [moxifloxacin Hcl In Nacl]    Citalopram    Crestor [rosuvastatin]    Dexilant [dexlansoprazole]    Escitalopram Oxalate    Statins    Myalgias, joint pain.   Wellbutrin [bupropion]       Medication List       Accurate as of 07/23/16 10:44 AM. Always use your most recent med list.          ALPRAZolam 0.5 MG tablet Commonly known as:  XANAX Take 1 tablet (0.5 mg total) by mouth at bedtime as needed.   ARMOUR THYROID 60 MG tablet Generic drug:  thyroid TAKE 1 TABLET BY MOUTH EVERY DAY   Biotin 10 MG Caps Take by mouth.   BLINK TEARS OP Apply to eye at bedtime as needed.   doxycycline 100 MG tablet Commonly known as:  VIBRA-TABS Take 100 mg by mouth daily.   Magnesium 100 MG Caps Take by mouth.   meloxicam 15 MG tablet Commonly known as:  MOBIC Take 1 tablet (15 mg total) by mouth as needed for pain.   MSM PO Take 1 tablet by mouth daily.   OVER THE COUNTER MEDICATION Stool softner   PERDIEM PO Take 3 tablets by mouth at bedtime.   Vitamin D (Ergocalciferol) 50000 units Caps capsule Commonly known as:  DRISDOL Take 1 capsule (50,000 Units total) by mouth every 7 (seven) days.       No results found for this or any previous visit (from the past 24 hour(s)). No results found.   ROS: Negative, with the exception of above mentioned in HPI   Objective:  BP (!) 143/86 (BP Location: Right Arm, Patient Position: Sitting, Cuff Size: Large)   Pulse 81   Temp 97 F (36.1 C)   Resp 20   Wt 202 lb 8 oz (91.9 kg)   SpO2 97%   BMI 31.72 kg/m  Body mass index is 31.72 kg/m. Gen: Afebrile. No acute distress. Nontoxic in appearance, well developed, well nourished.  HENT: AT. [place].  MMM, no oral lesions. Eyes:Pupils Equal Round Reactive to light, Extraocular movements intact,  Conjunctiva without redness, discharge or icterus. Neck/lymp/endocrine: Supple, no lymphadenopathy CV: RRR Chest: CTAB, no wheeze or crackles. Good air movement,  normal resp effort.  Abd: Soft. Obese. ND. Mild tenderness palpated over epigastric area. BS present. No Masses palpated. No rebound or guarding.  Neuro:  Normal gait. PERLA. EOMi. Alert. Oriented x3   Assessment/Plan: Robert Sunga is a 71 y.o. female present for epigastric pain OV for  1. Epigastric pain - new - Discussed gastric  reflux/peptic ulcer disease a possibility of pain. Will test CMP, lipase and H. pylori today. - Start omeprazole 40 mg daily.  - Start  align daily. - Follow a GERD diet, this was provided to her and AVS today. Monitoring for any food triggers. No Laying flat at least 2-3 hours after consuming a meal. - Continue regimen for constipation, currently doing well with Miralax. - Comp Met (CMET) - Lipase - H. pylori antibody, IgG  Reviewed expectations re: course of current medical issues.  Discussed self-management of symptoms.  Outlined signs and symptoms indicating need for more acute intervention.  Patient verbalized understanding and all questions were answered.  Patient received an After-Visit Summary. Follow-up in one month if not improving, sooner if worsening. If doing well we'll follow-up in 3 months after full treatment with higher dose PPI.  electronically signed by:  Howard Pouch, DO  [place]

## 2016-07-23 NOTE — Telephone Encounter (Signed)
Please call Carol Norton: Her lab work was all normal. No H. pylori infection present. - At the medication discussed during her office visit, and dietary modifications. - I have noticed that there is meloxicam on her medication list that she uses intermittently, I would discourage her from using this over the next few weeks or any other NSAID. - Follow-up in one month if not improving sooner if worsening.

## 2016-07-26 ENCOUNTER — Encounter: Payer: Self-pay | Admitting: *Deleted

## 2016-07-26 NOTE — Telephone Encounter (Signed)
Left message and sent message on my chart regarding lab results and instructions.

## 2016-09-21 ENCOUNTER — Telehealth: Payer: Self-pay | Admitting: Family Medicine

## 2016-09-21 NOTE — Telephone Encounter (Signed)
Patient LMOM at front desk asking for RF of alprazolam.  She asked if she needed appointment or if she can get RF.  Please advise.

## 2016-09-21 NOTE — Telephone Encounter (Signed)
Spoke with patient she needs to follow up for any refills on this medication. Schedule patient fore follow up appt.

## 2016-09-23 ENCOUNTER — Encounter: Payer: Self-pay | Admitting: Family Medicine

## 2016-09-23 ENCOUNTER — Ambulatory Visit (INDEPENDENT_AMBULATORY_CARE_PROVIDER_SITE_OTHER): Payer: Medicare Other | Admitting: Family Medicine

## 2016-09-23 VITALS — BP 135/77 | HR 62 | Temp 97.6°F | Resp 20 | Wt 209.2 lb

## 2016-09-23 DIAGNOSIS — F1310 Sedative, hypnotic or anxiolytic abuse, uncomplicated: Secondary | ICD-10-CM | POA: Diagnosis not present

## 2016-09-23 DIAGNOSIS — F411 Generalized anxiety disorder: Secondary | ICD-10-CM | POA: Diagnosis not present

## 2016-09-23 MED ORDER — ALPRAZOLAM 0.5 MG PO TABS: 0.5000 mg | ORAL_TABLET | Freq: Every evening | ORAL | 2 refills | Status: DC | PRN

## 2016-09-23 NOTE — Progress Notes (Signed)
Carol Norton , 31-Jul-1945, 71 y.o., female MRN: [id number] Patient Care Team    Relationship Specialty Notifications Start End  [name], DO PCP - General Family Medicine  05/26/16   [name], MD Consulting Physician Orthopedic Surgery  03/11/16   [name], NP Nurse Practitioner Nurse Practitioner  05/26/16   [name], MD Consulting Physician Internal Medicine  05/26/16    Comment: endocrine, thyroid  Kathee Delton, MD Referring Physician Pulmonary Disease  05/26/16   [name], MD Consulting Physician Neurology  05/26/16   [name], MD Consulting Physician Cardiology  05/26/16   [name], MD Consulting Physician Ophthalmology  05/26/16    Comment: Dr Kathlen Mody at [facility] for glaucoma and cataracts every 3 mos  [name], MD Attending Physician Family Medicine  05/26/16    Comment: [facility]    Chief Complaint  Patient presents with  . Anxiety     Subjective:  Anxiety with insomnia/OSA with CPAP:  Patient presents for anxiety follow-up today. She states she has been using the Xanax 0.5 mg daily at bedtime. She has been using this pretty routinely secondary to difficulty sleeping. She hopes to be able to single back to using half tab if able. She denies any side effects from oversedation with use of medication.   Prior note: Patient is prescribed Xanax 0.5 mg daily at bedtime when necessary. She states that sometimes she takes half a tab sometimes she needs a full tab to help her relax . She does endorse nightly use. Patient has a lot of worry and anxiety surrounding the continuation of this medicine. She reports it is what works for her, she has been on it for over 20 years and she would like to be able to continue it. She reports trying other medications for anxiety in the past, but states that there is a side effect to the medication she is likely to get. She denies any side effects to Xanax use. She denies any falls in the past year. She denies any  over sedation with use. Pt reports use of CPAP nightly and has machine/settings adjusted per [employer]. Depression screen PHQ 2/9 05/26/2016  Decreased Interest 0  Down, Depressed, Hopeless 0  PHQ - 2 Score 0    Allergies  Allergen Reactions  . Avelox [Moxifloxacin Hcl In Nacl]   . Citalopram   . Crestor [Rosuvastatin]   . Dexilant [Dexlansoprazole]   . Escitalopram Oxalate   . Statins     Myalgias, joint pain.  . Wellbutrin [Bupropion]    Social History  Substance Use Topics  . Smoking status: Former Smoker    Packs/day: 0.75    Years: 25.00    Types: Cigarettes    Quit date: 05/25/2007  . Smokeless tobacco: Never Used     Comment: quit for 10 years in the total 38yrs.   . Alcohol use No     Comment: rare   Past Medical History:  Diagnosis Date  . Allergic rhinitis   . Allergy   . Anxiety   . Bilateral carpal tunnel syndrome    Patient scheduled for surgery with neurology  . Cataract   . Chronic constipation   . Chronic headache   . Chronic sinusitis   . GERD (gastroesophageal reflux disease)   . Glaucoma   . Hyperlipemia   . Hypothyroid   . Leg length discrepancy   . Ocular rosacea   . Plantar fasciitis   . Sebaceous cyst   .  Sleep apnea    on CPAP  . Urethral stenosis   . Vitamin D deficiency    Past Surgical History:  Procedure Laterality Date  . ABDOMINAL HYSTERECTOMY  1983  . BREAST SURGERY     sebaceous cyst  . FACIAL COSMETIC SURGERY     1995  . GANGLION CYST EXCISION    . MENISCUS REPAIR Left   . RHINOPLASTY    . SEPTOPLASTY    . TUBAL LIGATION     Family History  Problem Relation Age of Onset  . Emphysema Father   . Alcohol abuse Father   . Allergies Mother   . Heart disease Mother   . Ovarian cancer Mother   . Hyperlipidemia Mother   . Hypothyroidism Mother   . Arthritis Mother   . Hypothyroidism Sister   . Lung cancer Paternal Aunt   . Diabetes Maternal Grandmother   . Heart disease Maternal Grandmother   . Stroke Paternal  Grandfather    Allergies as of 09/23/2016      Reactions   Avelox [moxifloxacin Hcl In Nacl]    Citalopram    Crestor [rosuvastatin]    Dexilant [dexlansoprazole]    Escitalopram Oxalate    Statins    Myalgias, joint pain.   Wellbutrin [bupropion]       Medication List       Accurate as of 09/23/16  9:49 AM. Always use your most recent med list.          ALPRAZolam 0.5 MG tablet Commonly known as:  XANAX Take 1 tablet (0.5 mg total) by mouth at bedtime as needed.   ARMOUR THYROID 60 MG tablet Generic drug:  thyroid TAKE 1 TABLET BY MOUTH EVERY DAY   Biotin 10 MG Caps Take by mouth.   BLINK TEARS OP Apply to eye at bedtime as needed.   doxycycline 100 MG tablet Commonly known as:  VIBRA-TABS Take 100 mg by mouth daily.   Magnesium 100 MG Caps Take by mouth.   meloxicam 15 MG tablet Commonly known as:  MOBIC Take 1 tablet (15 mg total) by mouth as needed for pain.   MSM PO Take 1 tablet by mouth daily.   omeprazole 40 MG capsule Commonly known as:  PRILOSEC Take 1 capsule (40 mg total) by mouth daily.   OVER THE COUNTER MEDICATION Stool softner   PERDIEM PO Take 3 tablets by mouth at bedtime.   Vitamin D (Ergocalciferol) 50000 units Caps capsule Commonly known as:  DRISDOL Take 1 capsule (50,000 Units total) by mouth every 7 (seven) days.       All past medical history, surgical history, allergies, family history, immunizations andmedications were updated in the EMR today and reviewed under the history and medication portions of their EMR.     ROS: Negative, with the exception of above mentioned in HPI   Objective:  BP 135/77 (BP Location: Left Arm, Patient Position: Sitting, Cuff Size: Large)   Pulse 62   Temp 97.6 F (36.4 C)   Resp 20   Wt 209 lb 4 oz (94.9 kg)   SpO2 98%   BMI 32.77 kg/m  Body mass index is 32.77 kg/m. Gen: Afebrile. No acute distress.  HENT: AT. [place].MMM.  Neuro: Normal gait.  Alert.  Psych: Normal affect, dress and  demeanor. Normal speech. Normal thought content and judgment.   No exam data present No results found. No results found for this or any previous visit (from the past 24 hour(s)).  Assessment/Plan: Carol Roys  Norton is a 71 y.o. female present for OV for  Anxiety Generalized anxiety disorder- Patient was made aware of addictive properties of Xanax, beers criteria and controlled substance requirements. - 12 month review of Controlled substance database was reviewed today and appropriate. - Controlled substance contract for benzodiazepines was signed by patient today. - Follow-up 3 months  Note is dictated utilizing voice recognition software. Although note has been proof read prior to signing, occasional typographical errors still can be missed. If any questions arise, please do not hesitate to call for verification.   electronically signed by:  Howard Pouch, DO  [facility]

## 2016-09-23 NOTE — Patient Instructions (Addendum)
It was nice to see you today. I am sorry your allergies are acting up for you.   I have refilled your xanax today. Make sure to follow up on time every 3 months if needing this medicine.    See if you can get into Charlann Boxer, DO ([address])  or Teresa Coombs, DO ([place]) for OMT.     Please help [place] help you:  We are honored you have chosen [facility] for your Primary Care home. Below you will find basic instructions that you may need to access in the future. Please help [place] help you by reading the instructions, which cover many of the frequent questions we experience.   Prescription refills and request:  -In order to allow more efficient response time, please call your pharmacy for all refills. They will forward the request electronically to [place]. This allows for the quickest possible response. Request left on a nurse line can take longer to refill, since these are checked as time allows between office patients and other phone calls.  - refill request can take up to 3-5 working days to complete.  - If request is sent electronically and request is appropiate, it is usually completed in 1-2 business days.  - all patients will need to be seen routinely for all chronic medical conditions requiring prescription medications (see follow-up below). If you are overdue for follow up on your condition, you will be asked to make an appointment and we will call in enough medication to cover you until your appointment (up to 30 days).  - all controlled substances will require a face to face visit to request/refill.  - if you desire your prescriptions to go through a new pharmacy, and have an active script at original pharmacy, you will need to call your pharmacy and have scripts transferred to new pharmacy. This is completed between the pharmacy locations and not by your provider.    Results: If any images or labs were ordered, it can take up to 1 week to get results depending on  the test ordered and the lab/facility running and resulting the test. - Normal or stable results, which do not need further discussion, may be released to your mychart immediately with attached note to you. A call may not be generated for normal results. Please make certain to sign up for mychart. If you have questions on how to activate your mychart you can call the front office.  - If your results need further discussion, our office will attempt to contact you via phone, and if unable to reach you after 2 attempts, we will release your abnormal result to your mychart with instructions.  - All results will be automatically released in mychart after 1 week.  - Your provider will provide you with explanation and instruction on all relevant material in your results. Please keep in mind, results and labs may appear confusing or abnormal to the untrained eye, but it does not mean they are actually abnormal for you personally. If you have any questions about your results that are not covered, or you desire more detailed explanation than what was provided, you should make an appointment with your provider to do so.   Our office handles many outgoing and incoming calls daily. If we have not contacted you within 1 week about your results, please check your mychart to see if there is a message first and if not, then contact our office.  In helping with this matter,  you help decrease call volume, and therefore allow [place] to be able to respond to patients needs more efficiently.   Acute office visits (sick visit):  An acute visit is intended for a new problem and are scheduled in shorter time slots to allow schedule openings for patients with new problems. This is the appropriate visit to discuss a new problem. In order to provide you with excellent quality medical care with proper time for you to explain your problem, have an exam and receive treatment with instructions, these appointments should be limited to one new  problem per visit. If you experience a new problem, in which you desire to be addressed, please make an acute office visit, we save openings on the schedule to accommodate you. Please do not save your new problem for any other type of visit, let us take care of it properly and quickly for you.   Follow up visits:  Depending on your condition(s) your provider will need to see you routinely in order to provide you with quality care and prescribe medication(s). Most chronic conditions (Example: hypertension, Diabetes, depression/anxiety... etc), require visits a couple times a year. Your provider will instruct you on proper follow up for your personal medical conditions and history. Please make certain to make follow up appointments for your condition as instructed. Failing to do so could result in lapse in your medication treatment/refills. If you request a refill, and are overdue to be seen on a condition, we will always provide you with a 30 day script (once) to allow you time to schedule.    Medicare wellness (well visit): - we have a wonderful Nurse Maudie Mercury), that will meet with you and provide you will yearly medicare wellness visits. These visits should occur yearly (can not be scheduled less than 1 calendar year apart) and cover preventive health, immunizations, advance directives and screenings you are entitled to yearly through your medicare benefits. Do not miss out on your entitled benefits, this is when medicare will pay for these benefits to be ordered for you.  These are strongly encouraged by your provider and is the appropriate type of visit to make certain you are up to date with all preventive health benefits. If you have not had your medicare wellness exam in the last 12 months, please make certain to schedule one by calling the office and schedule your medicare wellness with Maudie Mercury as soon as possible.   Yearly physical (well visit):  - Adults are recommended to be seen yearly for physicals.  Check with your insurance and date of your last physical, most insurances require one calendar year between physicals. Physicals include all preventive health topics, screenings, medical exam and labs that are appropriate for gender/age and history. You may have fasting labs needed at this visit. This is a well visit (not a sick visit), new problems should not be covered during this visit (see acute visit).  - Pediatric patients are seen more frequently when they are younger. Your provider will advise you on well child visit timing that is appropriate for your their age. - This is not a medicare wellness visit. Medicare wellness exams do not have an exam portion to the visit. Some medicare companies allow for a physical, some do not allow a yearly physical. If your medicare allows a yearly physical you can schedule the medicare wellness with our nurse Maudie Mercury and have your physical with your provider after, on the same day. Please check with insurance for your full benefits.  Late Policy/No Shows:  - all new patients should arrive 15-30 minutes earlier than appointment to allow [place] time  to  obtain all personal demographics,  insurance information and for you to complete office paperwork. - All established patients should arrive 10-15 minutes earlier than appointment time to update all information and be checked in .  - In our best efforts to run on time, if you are late for your appointment you will be asked to either reschedule or if able, we will work you back into the schedule. There will be a wait time to work you back in the schedule,  depending on availability.  - If you are unable to make it to your appointment as scheduled, please call 24 hours ahead of time to allow [place] to fill the time slot with someone else who needs to be seen. If you do not cancel your appointment ahead of time, you may be charged a no show fee.

## 2016-10-06 ENCOUNTER — Telehealth: Payer: Self-pay | Admitting: Family Medicine

## 2016-10-06 NOTE — Telephone Encounter (Signed)
Please explain to patient the is a prescribed medication, therefore she will need to be seen and evaluated for the need of this medicine.  This is not a medicine I have prescribed to her, or that is on her med list from prior PCP.  We can  Not start a new med without appt.  Sorry for the inconvenience.

## 2016-10-06 NOTE — Telephone Encounter (Signed)
Spoke with patient explained to her appt would be necessary for evaluation of her symptoms and appropriate treatment. Patient verbalized understanding.

## 2016-10-06 NOTE — Telephone Encounter (Signed)
Patient requesting Rx for Zofran. Is nauseous. Carol Norton

## 2016-10-06 NOTE — Telephone Encounter (Signed)
We have not evaluated her for nausea or Rx'd zofran for her. Please advise .She was last seen 09/23/16 for anxiety.

## 2016-10-08 ENCOUNTER — Other Ambulatory Visit: Payer: Self-pay

## 2016-10-08 ENCOUNTER — Other Ambulatory Visit (INDEPENDENT_AMBULATORY_CARE_PROVIDER_SITE_OTHER): Payer: Medicare Other

## 2016-10-08 DIAGNOSIS — E039 Hypothyroidism, unspecified: Secondary | ICD-10-CM

## 2016-10-08 LAB — T3, FREE: T3 FREE: 3.4 pg/mL (ref 2.3–4.2)

## 2016-10-08 LAB — T4, FREE: FREE T4: 0.56 ng/dL — AB (ref 0.60–1.60)

## 2016-10-08 LAB — TSH: TSH: 1.06 u[IU]/mL (ref 0.35–4.50)

## 2016-10-12 ENCOUNTER — Other Ambulatory Visit: Payer: Self-pay | Admitting: Internal Medicine

## 2016-10-13 ENCOUNTER — Other Ambulatory Visit: Payer: Self-pay

## 2016-10-13 MED ORDER — THYROID 60 MG PO TABS: 60.0000 mg | ORAL_TABLET | Freq: Every day | ORAL | 0 refills | Status: DC

## 2016-10-19 ENCOUNTER — Other Ambulatory Visit: Payer: Self-pay

## 2016-10-19 MED ORDER — THYROID 60 MG PO TABS: 60.0000 mg | ORAL_TABLET | Freq: Every day | ORAL | 2 refills | Status: DC

## 2016-11-29 ENCOUNTER — Other Ambulatory Visit: Payer: Self-pay | Admitting: *Deleted

## 2016-11-29 MED ORDER — OMEPRAZOLE 40 MG PO CPDR: 40.0000 mg | DELAYED_RELEASE_CAPSULE | Freq: Every day | ORAL | 0 refills | Status: DC

## 2016-11-30 ENCOUNTER — Ambulatory Visit (INDEPENDENT_AMBULATORY_CARE_PROVIDER_SITE_OTHER): Payer: Medicare Other | Admitting: Internal Medicine

## 2016-11-30 ENCOUNTER — Encounter: Payer: Self-pay | Admitting: Internal Medicine

## 2016-11-30 VITALS — BP 134/84 | HR 75 | Wt 208.0 lb

## 2016-11-30 DIAGNOSIS — E039 Hypothyroidism, unspecified: Secondary | ICD-10-CM

## 2016-11-30 MED ORDER — ARMOUR THYROID 60 MG PO TABS: 60.0000 mg | ORAL_TABLET | Freq: Every day | ORAL | 3 refills | Status: DC

## 2016-11-30 NOTE — Progress Notes (Signed)
Patient ID: Carol Norton, female   DOB: 01/25/1946, 71 y.o.   MRN: [id number]    HPI  Carol Norton is a 71 y.o.-year-old female, initially referred by Laretta Alstrom, NP, returning for f/u for  hypothyroidism. Last visit 7 mo ago.  Reviewed and addended hx: Pt. has been dx with hypothyroidism in 1990 >> on Levothyroxine initially, then tried Armour >> then back to Levothyroxine >> increased hair loss, fatigue, SOB, HAs >> switched to Armour >> started to feel much better: Less fatigue, more active, no HA.   She takes Armour 60 mg 6 out of 7 days and 30 mg 1 out of 7 days: - fasting, chews it - with water - separated by more than 2 hours from breakfast - No calcium iron or multivitamins - +occas. Tums, PPIs - late during the day  I reviewed pt's thyroid tests: Lab Results  Component Value Date   TSH 1.06 10/08/2016   TSH 1.29 05/21/2016   TSH 0.23 (L) 02/02/2016   TSH 0.73 08/12/2015   TSH 1.090 05/13/2015   TSH 0.220 (L) 04/09/2015   TSH 0.143 (L) 02/25/2015   TSH 1.231 12/17/2014   FREET4 0.56 (L) 10/08/2016   FREET4 0.58 (L) 05/21/2016   FREET4 0.61 02/02/2016   FREET4 0.97 04/09/2015   FREET4 0.85 02/25/2015   FREET4 0.71 (L) 12/17/2014   12/15/2015: TSH 0.060 on LT4 100 >> decreased LT4 to 88 >> then switched to Armour 60 mg 10/21/2015: 0.285 09/12/2015: 0.941  Component     Latest Ref Rng & Units 02/02/2016  Thyroglobulin Ab     <2 IU/mL <1  Thyroperoxidase Ab SerPl-aCnc     <9 IU/mL 2   Pt denies: - feeling nodules in neck - hoarseness - dysphagia - choking - SOB with lying down  She has + FH of thyroid disorders in: M and sister. No FH of thyroid cancer. No h/o radiation tx to head or neck.  No seaweed or kelp. No recent contrast studies. No herbal supplements. No Biotin use. No recent steroids use.   She takes vit D 100,000 IU weekly  ROS: Constitutional: no weight gain/no weight loss, + fatigue, + subjective hyperthermia, no subjective hypothermia Eyes:  no blurry vision, no xerophthalmia ENT: no sore throat, no nodules palpated in throat, no dysphagia, no odynophagia, no hoarseness Cardiovascular: no CP/+ SOB/+ palpitations/no leg swelling Respiratory: no cough/+ SOB/no wheezing Gastrointestinal: no N/no V/no D/+ C/+ acid reflux Musculoskeletal: + muscle aches/+ joint aches Skin: no rashes, + hair loss Neurological: no tremors/no numbness/no tingling/no dizziness  I reviewed pt's medications, allergies, PMH, social hx, family hx, and changes were documented in the history of present illness. Otherwise, unchanged from my initial visit note.  Past Medical History:  Diagnosis Date  . Allergic rhinitis   . Allergy   . Anxiety   . Bilateral carpal tunnel syndrome    Patient scheduled for surgery with neurology  . Cataract   . Chronic constipation   . Chronic headache   . Chronic sinusitis   . GERD (gastroesophageal reflux disease)   . Glaucoma   . Hyperlipemia   . Hypothyroid   . Leg length discrepancy   . Ocular rosacea   . Plantar fasciitis   . Sebaceous cyst   . Sleep apnea    on CPAP  . Urethral stenosis   . Vitamin D deficiency    Past Surgical History:  Procedure Laterality Date  . ABDOMINAL HYSTERECTOMY  1983  . BREAST SURGERY  sebaceous cyst  . FACIAL COSMETIC SURGERY     1995  . GANGLION CYST EXCISION    . MENISCUS REPAIR Left   . RHINOPLASTY    . SEPTOPLASTY    . TUBAL LIGATION     Social History   Social History  . Marital status: Married    Spouse name: [name]  . Number of children: 2  . Years of education: 13   Occupational History  . retired    Social History Main Topics  . Smoking status: Former Smoker    Packs/day: 0.75    Years: 25.00    Types: Cigarettes    Quit date: 05/25/2007  . Smokeless tobacco: Never Used     Comment: quit for 10 years in the total 56yrs.   . Alcohol use No     Comment: rare  . Drug use: No  . Sexual activity: Yes    Partners: Male    Birth control/  protection: Surgical     Comment: Married   Other Topics Concern  . Not on file   Social History Narrative   Married to [place]. 2 children.   Dinks caffeine.   Patient is right handed.    Exercises routinely.   Wears seatbelt, smoke detector at home.   Feels safe in relationship.   Current Outpatient Prescriptions on File Prior to Visit  Medication Sig Dispense Refill  . ALPRAZolam (XANAX) 0.5 MG tablet Take 1 tablet (0.5 mg total) by mouth at bedtime as needed. 30 tablet 2  . doxycycline (VIBRA-TABS) 100 MG tablet Take 100 mg by mouth daily.  20 tablet 0  . Magnesium 100 MG CAPS Take 400 mg by mouth.     . meloxicam (MOBIC) 15 MG tablet Take 1 tablet (15 mg total) by mouth as needed for pain. 30 tablet 3  . omeprazole (PRILOSEC) 40 MG capsule Take 1 capsule (40 mg total) by mouth daily. 30 capsule 0  . OVER THE COUNTER MEDICATION Stool softner    . Polyethylene Glycol 400 (BLINK TEARS OP) Apply to eye at bedtime as needed.    . Senna-Psyllium (PERDIEM PO) Take 3 tablets by mouth at bedtime.    Marland Kitchen thyroid (ARMOUR THYROID) 60 MG tablet Take 1 tablet (60 mg total) by mouth daily. 30 tablet 2  . Vitamin D, Ergocalciferol, (DRISDOL) 50000 units CAPS capsule Take 1 capsule (50,000 Units total) by mouth every 7 (seven) days. 12 capsule 3  . Biotin 10 MG CAPS Take by mouth.    . Methylsulfonylmethane (MSM PO) Take 1 tablet by mouth daily.     No current facility-administered medications on file prior to visit.    Allergies  Allergen Reactions  . Avelox [Moxifloxacin Hcl In Nacl]   . Citalopram   . Crestor [Rosuvastatin]   . Dexilant [Dexlansoprazole]   . Escitalopram Oxalate   . Statins     Myalgias, joint pain.  . Wellbutrin [Bupropion]    Family History  Problem Relation Age of Onset  . Emphysema Father   . Alcohol abuse Father   . Allergies Mother   . Heart disease Mother   . Ovarian cancer Mother   . Hyperlipidemia Mother   . Hypothyroidism Mother   . Arthritis Mother    . Hypothyroidism Sister   . Lung cancer Paternal Aunt   . Diabetes Maternal Grandmother   . Heart disease Maternal Grandmother   . Stroke Paternal Grandfather    PE: BP 134/84 (BP Location: Left Arm, Patient Position: Sitting)  Pulse 75   Wt 208 lb (94.3 kg)   SpO2 97%   BMI 32.58 kg/m  Wt Readings from Last 3 Encounters:  11/30/16 208 lb (94.3 kg)  09/23/16 209 lb 4 oz (94.9 kg)  07/23/16 202 lb 8 oz (91.9 kg)   Constitutional: overweight, in NAD Eyes: PERRLA, EOMI, no exophthalmos ENT: moist mucous membranes, no thyromegaly, but slight left lobe prominence, no cervical lymphadenopathy Cardiovascular: RRR, No MRG Respiratory: CTA B Gastrointestinal: abdomen soft, NT, ND, BS+ Musculoskeletal: no deformities, strength intact in all 4 Skin: moist, warm, no rashes Neurological: no tremor with outstretched hands, DTR normal in all 4  ASSESSMENT: 1. Hypothyroidism  PLAN:  1. Patient with long-standing hypothyroidism, on Armour tx. She is now on 60 mg of Armour 6/7 days and 30 mg 1/7 days. She feels good on Armour, much better than on levothyroxine. She has improved fatigue, weight, and headaches. We'll continue with Armour at the current dose. - latest thyroid labs reviewed with pt >> normal  - we discussed about taking the thyroid hormone every day, with water, >30 minutes before breakfast, separated by >4 hours from acid reflux medications, calcium, iron, multivitamins. Pt. is taking it correctly - She was previously on biotin, but not lately - We previously checked her thyroid antibodies and they were negative - will check thyroid tests at next visit but we discussed when to come and have labs checked before next visit - OTW, I will see her back in one year.  [name], MD PhD [facility] Endocrinology

## 2016-11-30 NOTE — Patient Instructions (Signed)
Please continue Armour 60 mg 6/7 days and 30 mg 1/7 days.  Take the thyroid hormone every day, with water, at least 30 minutes before breakfast, separated by at least 4 hours from: - acid reflux medications - calcium - iron - multivitamins  Please come back for a follow-up appointment in 1 year.

## 2016-12-06 ENCOUNTER — Ambulatory Visit (INDEPENDENT_AMBULATORY_CARE_PROVIDER_SITE_OTHER): Payer: Medicare Other | Admitting: Family Medicine

## 2016-12-06 ENCOUNTER — Encounter: Payer: Self-pay | Admitting: Family Medicine

## 2016-12-06 ENCOUNTER — Ambulatory Visit (HOSPITAL_BASED_OUTPATIENT_CLINIC_OR_DEPARTMENT_OTHER)
Admission: RE | Admit: 2016-12-06 | Discharge: 2016-12-06 | Disposition: A | Discharge status: Home / Self Care | Payer: Medicare Other | Source: Ambulatory Visit | Attending: Family Medicine | Admitting: Family Medicine

## 2016-12-06 VITALS — BP 127/86 | HR 96 | Temp 98.0°F | Resp 20 | Wt 206.0 lb

## 2016-12-06 DIAGNOSIS — J329 Chronic sinusitis, unspecified: Secondary | ICD-10-CM | POA: Diagnosis not present

## 2016-12-06 DIAGNOSIS — R059 Cough, unspecified: Secondary | ICD-10-CM

## 2016-12-06 DIAGNOSIS — R05 Cough: Secondary | ICD-10-CM | POA: Insufficient documentation

## 2016-12-06 LAB — CBC WITH DIFFERENTIAL/PLATELET
Basophils Absolute: 0 cells/uL (ref 0–200)
Basophils Relative: 0 %
Eosinophils Absolute: 339 cells/uL (ref 15–500)
Eosinophils Relative: 3 %
HCT: 44.2 % (ref 35.0–45.0)
Hemoglobin: 14.7 g/dL (ref 11.7–15.5)
Lymphocytes Relative: 28 %
Lymphs Abs: 3164 cells/uL (ref 850–3900)
MCH: 30.8 pg (ref 27.0–33.0)
MCHC: 33.3 g/dL (ref 32.0–36.0)
MCV: 92.7 fL (ref 80.0–100.0)
MPV: 11.2 fL (ref 7.5–12.5)
Monocytes Absolute: 1017 cells/uL — ABNORMAL HIGH (ref 200–950)
Monocytes Relative: 9 %
Neutro Abs: 6780 cells/uL (ref 1500–7800)
Neutrophils Relative %: 60 %
Platelets: 243 10*3/uL (ref 140–400)
RBC: 4.77 MIL/uL (ref 3.80–5.10)
RDW: 14.5 % (ref 11.0–15.0)
WBC: 11.3 10*3/uL — ABNORMAL HIGH (ref 3.8–10.8)

## 2016-12-06 MED ORDER — AMOXICILLIN-POT CLAVULANATE 875-125 MG PO TABS: 1.0000 | ORAL_TABLET | Freq: Two times a day (BID) | ORAL | 0 refills | Status: DC

## 2016-12-06 NOTE — Progress Notes (Signed)
Carol Norton , 1946/05/10, 71 y.o., female MRN: [id number] Patient Care Team    Relationship Specialty Notifications Start End  [name], DO PCP - General Family Medicine  05/26/16   [name], MD Consulting Physician Orthopedic Surgery  03/11/16   [name], NP Nurse Practitioner Nurse Practitioner  05/26/16   [name], MD Consulting Physician Internal Medicine  05/26/16    Comment: endocrine, thyroid  [name], [name], MD Referring Physician Pulmonary Disease  05/26/16   [name], MD Consulting Physician Neurology  05/26/16   [name], MD Consulting Physician Cardiology  05/26/16   [name], MD Consulting Physician Ophthalmology  05/26/16    Comment: Dr Kathlen Mody at [facility] for glaucoma and cataracts every 3 mos  [name], MD Attending Physician Family Medicine  05/26/16    Comment: [facility]    Chief Complaint  Patient presents with  . URI    congestion,     Subjective: Pt presents for an OV with complaints of Frontal sinus pain of greater than 1 week duration.  Associated symptoms include nasal congestion, hoarseness, mild cough with increased phlegm production. She denies fever, chills, nausea, vomit, diarrhea or rash. She has been taking Sudafed, Afrin, Allegra and Mucinex. She is eating and drinking well. She is concerned because she has an upcoming knee surgery this week, and wants to make sure she is able to have surgery.   Depression screen [facility] 2/9 09/23/2016 05/26/2016  Decreased Interest 0 0  Down, Depressed, Hopeless 0 0  PHQ - 2 Score 0 0    Allergies  Allergen Reactions  . Avelox [Moxifloxacin Hcl In Nacl]   . Citalopram   . Crestor [Rosuvastatin]   . Dexilant [Dexlansoprazole]   . Escitalopram Oxalate   . Statins     Myalgias, joint pain.  . Wellbutrin [Bupropion]    Social History  Substance Use Topics  . Smoking status: Former Smoker    Packs/day: 0.75    Years: 25.00    Types: Cigarettes    Quit date: 05/25/2007    . Smokeless tobacco: Never Used     Comment: quit for 10 years in the total 74yrs.   . Alcohol use No     Comment: rare   Past Medical History:  Diagnosis Date  . Allergic rhinitis   . Allergy   . Anxiety   . Bilateral carpal tunnel syndrome    Patient scheduled for surgery with neurology  . Cataract   . Chronic constipation   . Chronic headache   . Chronic sinusitis   . GERD (gastroesophageal reflux disease)   . Glaucoma   . Hyperlipemia   . Hypothyroid   . Leg length discrepancy   . Ocular rosacea   . Plantar fasciitis   . Sebaceous cyst   . Sleep apnea    on CPAP  . Urethral stenosis   . Vitamin D deficiency    Past Surgical History:  Procedure Laterality Date  . ABDOMINAL HYSTERECTOMY  1983  . BREAST SURGERY     sebaceous cyst  . FACIAL COSMETIC SURGERY     1995  . GANGLION CYST EXCISION    . MENISCUS REPAIR Left   . RHINOPLASTY    . SEPTOPLASTY    . TUBAL LIGATION     Family History  Problem Relation Age of Onset  . Emphysema Father   . Alcohol abuse Father   . Allergies Mother   . Heart disease Mother   .  Ovarian cancer Mother   . Hyperlipidemia Mother   . Hypothyroidism Mother   . Arthritis Mother   . Hypothyroidism Sister   . Lung cancer Paternal Aunt   . Diabetes Maternal Grandmother   . Heart disease Maternal Grandmother   . Stroke Paternal Grandfather    Allergies as of 12/06/2016      Reactions   Avelox [moxifloxacin Hcl In Nacl]    Citalopram    Crestor [rosuvastatin]    Dexilant [dexlansoprazole]    Escitalopram Oxalate    Statins    Myalgias, joint pain.   Wellbutrin [bupropion]       Medication List       Accurate as of 12/06/16  2:51 PM. Always use your most recent med list.          acetaminophen 500 MG tablet Commonly known as:  TYLENOL Take 500 mg by mouth every 6 (six) hours as needed.   AFRIN ALLERGY NA Place into the nose.   ALLEGRA PO Take by mouth.   ALPRAZolam 0.5 MG tablet Commonly known as:   XANAX Take 1 tablet (0.5 mg total) by mouth at bedtime as needed.   ARMOUR THYROID 60 MG tablet Generic drug:  thyroid Take 1 tablet (60 mg total) by mouth daily.   Biotin 10 MG Caps Take by mouth.   BLINK TEARS OP Apply to eye at bedtime as needed.   Magnesium 100 MG Caps Take 400 mg by mouth.   meloxicam 15 MG tablet Commonly known as:  MOBIC Take 1 tablet (15 mg total) by mouth as needed for pain.   MSM PO Take 1 tablet by mouth daily.   omeprazole 40 MG capsule Commonly known as:  PRILOSEC Take 1 capsule (40 mg total) by mouth daily.   OVER THE COUNTER MEDICATION Stool softner   PERDIEM PO Take 3 tablets by mouth at bedtime.   SUDAFED PO Take by mouth.   Vitamin D (Ergocalciferol) 50000 units Caps capsule Commonly known as:  DRISDOL Take 1 capsule (50,000 Units total) by mouth every 7 (seven) days.   Shirley Friar OP Apply to eye 2 (two) times daily.   XIIDRA 5 % Soln Generic drug:  Lifitegrast INSTILL 1 DROP IN BOTH EYES TWICE A DAY       All past medical history, surgical history, allergies, family history, immunizations andmedications were updated in the EMR today and reviewed under the history and medication portions of their EMR.     ROS: Negative, with the exception of above mentioned in HPI   Objective:  BP 127/86 (BP Location: Right Arm, Patient Position: Sitting, Cuff Size: Large)   Pulse 96   Temp 98 F (36.7 C)   Resp 20   Wt 206 lb (93.4 kg)   SpO2 97%   BMI 32.26 kg/m  Body mass index is 32.26 kg/m. Gen: Afebrile. No acute distress. Nontoxic in appearance, well developed, well nourished. Very pleasant Caucasian female. HENT: AT. [place]. Right EAM with cerumen impaction, left tympanic membrane within normal limits, no erythema or fullness. MMM, no oral lesions. Bilateral nares with mild erythema and drainage present. Throat without erythema or exudates. Mild cough 1 on exam. Mild hoarseness. Tender to palpation right maxillary and frontal  sinuses. Eyes:Pupils Equal Round Reactive to light, Extraocular movements intact,  Conjunctiva without redness, discharge or icterus. Neck/lymp/endocrine: Supple, no lymphadenopathy CV: RRR  Chest: CTAB, no wheeze or crackles. Good air movement, normal resp effort.  Abd: Soft. NTND. BS present.  Skin: No rashes,  purpura or petechiae.  Neuro: Normal gait. PERLA. EOMi. Alert. Oriented x3  No exam data present No results found. No results found for this or any previous visit (from the past 24 hour(s)).  Assessment/Plan: Cate Oravec is a 71 y.o. female present for OV for  Cough Rhinosinusitis - Rest, hydrate, Mucinex, Flonase short-term if okay by eye doctor. Continue Allegra. Use nasal saline a few times a day. - Start Augmentin twice a day 10 days. - We will make certain chest x-ray is clear. Cannot say she is cleared for surgery, since this is anesthesia's call, but we can at least give them the information by having a chest x-ray and a CBC. - DG Chest 2 View; Future - CBC w/Diff - Follow-up dependent on lab/image results.   Reviewed expectations re: course of current medical issues.  Discussed self-management of symptoms.  Outlined signs and symptoms indicating need for more acute intervention.  Patient verbalized understanding and all questions were answered.  Patient received an After-Visit Summary.    No orders of the defined types were placed in this encounter.    Note is dictated utilizing voice recognition software. Although note has been proof read prior to signing, occasional typographical errors still can be missed. If any questions arise, please do not hesitate to call for verification.   electronically signed by:  Howard Pouch, DO  [place]

## 2016-12-06 NOTE — Patient Instructions (Signed)
Start augmentin  Continue allegra.  +/_ short term flonase.   Nasal saline a few times a day.    Get chest xray, and we will call you tomorrow with results.

## 2016-12-07 ENCOUNTER — Telehealth: Payer: Self-pay | Admitting: Family Medicine

## 2016-12-07 NOTE — Telephone Encounter (Signed)
Please call pt: - her cxr is normal, no signs of infection.  - her blood work is consistent with a mild sinus infection.  - If she asks: I can not determine clear vs not cleared for surgery on Thursday, as that is up to her surgeon and anesthesiologist. I suspect she will be feeling better by Thursday, but she will need to finish antibiotics.

## 2016-12-07 NOTE — Telephone Encounter (Signed)
Spoke with patient reviewed results and instructions . Patient verbalized understanding. 

## 2016-12-09 ENCOUNTER — Telehealth: Payer: Self-pay | Admitting: Family Medicine

## 2016-12-09 ENCOUNTER — Ambulatory Visit (INDEPENDENT_AMBULATORY_CARE_PROVIDER_SITE_OTHER): Payer: Medicare Other | Admitting: Family Medicine

## 2016-12-09 ENCOUNTER — Other Ambulatory Visit: Payer: Self-pay | Admitting: Family Medicine

## 2016-12-09 ENCOUNTER — Encounter: Payer: Self-pay | Admitting: Family Medicine

## 2016-12-09 VITALS — BP 147/85 | HR 86 | Temp 98.2°F | Resp 16 | Wt 206.5 lb

## 2016-12-09 DIAGNOSIS — R0981 Nasal congestion: Secondary | ICD-10-CM

## 2016-12-09 DIAGNOSIS — H40033 Anatomical narrow angle, bilateral: Secondary | ICD-10-CM

## 2016-12-09 DIAGNOSIS — J343 Hypertrophy of nasal turbinates: Secondary | ICD-10-CM

## 2016-12-09 MED ORDER — PREDNISONE 10 MG PO TABS: ORAL_TABLET | ORAL | Status: DC

## 2016-12-09 NOTE — Telephone Encounter (Signed)
Patient Name: Carol Norton  DOB: 03/14/1946    Initial Comment Caller states taking Amox for sinus infection, sinuses are so swollen she is having trouble breathing    Nurse Assessment  Nurse: Raphael Gibney, RN, Vanita Ingles Date/Time (Eastern Time): 12/09/2016 2:41:39 PM  Confirm and document reason for call. If symptomatic, describe symptoms. ---Caller states she is taking amoxicillin for sinus infection. her sinuses are swollen she is having trouble breathing through her nose. She is using mucinex and nasonex. Has been on antibiotics for 5 days. No fever.  Does the patient have any new or worsening symptoms? ---Yes  Will a triage be completed? ---Yes  Related visit to physician within the last 2 weeks? ---Yes  Does the PT have any chronic conditions? (i.e. diabetes, asthma, etc.) ---Yes  List chronic conditions. ---sinus issues  Is this a behavioral health or substance abuse call? ---No     Guidelines    Guideline Title Affirmed Question Affirmed Notes  Sinus Infection on Antibiotic Follow-up Call [1] Taking antibiotic AND [2] nose still blocked    Final Disposition User   See Physician within 4 Hours (or PCP triage) Raphael Gibney, RN, [place]    Comments  Triage outcome upgraded to see physician within 4 hrs as sinuses are so swollen, she can not breathe through her nose. She can breathe through her mouth fine.  appt scheduled for 12/09/2016 at 3:45 pm with Dr. Ricardo Jericho   Referrals  REFERRED TO PCP OFFICE   Disagree/Comply: Comply

## 2016-12-09 NOTE — Progress Notes (Signed)
OFFICE VISIT  12/09/2016   CC:  Chief Complaint  Patient presents with  . URI    has seen Dr. Raoul Pitch recent for sinusitis   HPI:    Patient is a 71 y.o. Caucasian female who presents for worsening nasal congestion. Was seen by Dr. Raoul Pitch 3 days ago and started on augmentin for rhinitis with cough. She was also told to take mucinex DM and avoid sudafed, and nasal steroid as well as saline nasal spray.   Pt reported taking afrin a week or two before her appt, but none recently.  She has had PND, ST, some slight phlegmy cough.  Ears stopped up/pressure.  No fever. She reports having hx of narrow angle glaucoma, is apprehensive about using steroids for her nose.  Dr. Carmelina Peal is her allergist but she has not seen him for 3 yrs or so.  She called today and got an appointment to re-establish with him on 01/25/17. She does not have an ENT MD. Her eye MD is Dr. Marshall Cork with [facility].   Past Medical History:  Diagnosis Date  . Allergic rhinitis   . Allergy   . Anxiety   . Bilateral carpal tunnel syndrome    Patient scheduled for surgery with neurology  . Cataract   . Chronic constipation   . Chronic headache   . Chronic sinusitis   . GERD (gastroesophageal reflux disease)   . Glaucoma   . Hyperlipemia   . Hypothyroid   . Leg length discrepancy   . Ocular rosacea   . Plantar fasciitis   . Sebaceous cyst   . Sleep apnea    on CPAP  . Urethral stenosis   . Vitamin D deficiency     Past Surgical History:  Procedure Laterality Date  . ABDOMINAL HYSTERECTOMY  1983  . BREAST SURGERY     sebaceous cyst  . FACIAL COSMETIC SURGERY     1995  . GANGLION CYST EXCISION    . MENISCUS REPAIR Left   . RHINOPLASTY    . SEPTOPLASTY    . TUBAL LIGATION      Outpatient Medications Prior to Visit  Medication Sig Dispense Refill  . acetaminophen (TYLENOL) 500 MG tablet Take 500 mg by mouth every 6 (six) hours as needed.    . ALPRAZolam (XANAX) 0.5 MG tablet Take 1  tablet (0.5 mg total) by mouth at bedtime as needed. 30 tablet 2  . amoxicillin-clavulanate (AUGMENTIN) 875-125 MG tablet Take 1 tablet by mouth 2 (two) times daily. 20 tablet 0  . ARMOUR THYROID 60 MG tablet Take 1 tablet (60 mg total) by mouth daily. 90 tablet 3  . Biotin 10 MG CAPS Take by mouth.    . Fexofenadine HCl (ALLEGRA PO) Take by mouth.    . Magnesium 100 MG CAPS Take 400 mg by mouth.     . meloxicam (MOBIC) 15 MG tablet Take 1 tablet (15 mg total) by mouth as needed for pain. 30 tablet 3  . Methylsulfonylmethane (MSM PO) Take 1 tablet by mouth daily.    Marland Kitchen omeprazole (PRILOSEC) 40 MG capsule Take 1 capsule (40 mg total) by mouth daily. 30 capsule 0  . OVER THE COUNTER MEDICATION Stool softner    . Phenylephrine HCl (AFRIN ALLERGY NA) Place into the nose.    . Polyethylene Glycol 400 (BLINK TEARS OP) Apply to eye at bedtime as needed.    . Senna-Psyllium (PERDIEM PO) Take 3 tablets by mouth at bedtime.    Marland Kitchen  Vitamin D, Ergocalciferol, (DRISDOL) 50000 units CAPS capsule Take 1 capsule (50,000 Units total) by mouth every 7 (seven) days. 12 capsule 3  . Pseudoephedrine HCl (SUDAFED PO) Take by mouth.    Marland Kitchen XIIDRA 5 % SOLN INSTILL 1 DROP IN BOTH EYES TWICE A DAY  4  . Lifitegrast (XIIDRA OP) Apply to eye 2 (two) times daily.     No facility-administered medications prior to visit.     Allergies  Allergen Reactions  . Avelox [Moxifloxacin Hcl In Nacl]   . Citalopram   . Crestor [Rosuvastatin]   . Dexilant [Dexlansoprazole]   . Escitalopram Oxalate   . Statins     Myalgias, joint pain.  . Wellbutrin [Bupropion]     ROS As per HPI  PE: Blood pressure (!) 147/85, pulse 86, temperature 98.2 F (36.8 C), temperature source Oral, resp. rate 16, weight 206 lb 8 oz (93.7 kg), SpO2 99 %. Gen: Alert, well appearing.  Patient is oriented to person, place, time, and situation. AFFECT: pleasant, lucid thought and speech. VS: noted--normal. HEENT: eyes without injection, drainage, or  swelling.  Ears: EACs clear, TMs with normal light reflex and landmarks.  Nose: Scant clear rhinorrhea, with some dried, crusty exudate adherent to mildly injected and moderately enlarged/edematous turbinates.  No purulent d/c.  No paranasal sinus TTP.  No facial swelling.  Throat and mouth without focal lesion.  No pharyngial swelling, erythema, or exudate.   Neck: supple, no LAD.   LUNGS: CTA bilat, nonlabored resps.   CV: RRR, no m/r/g. EXT: no c/c/e SKIN: no rash  LABS:  none  IMPRESSION AND PLAN:  1) Allergic vs infectious rhinosinusitis, with significant nasal turbinate edema/hypertrophy bilat. Per pt's history, she swears this is not rhinitis medicamentosa. PLAN: Continue augmentin as rx'd by Dr. Raoul Pitch. Avoid afrin--I reiterated this strongly today. I discussed the case with pt's ophthalmologist, Dr. Kathlen Mody, while pt was here today and he said she is a narrow angle glaucoma suspect, and he was ok with me using either topical OR systemic steroids for her current condition.  He will have his office staff call pt and arrange an appt with his office in 2-3 weeks to recheck her eye pressure.  Discussed plan with pt and she was apprehensive but agreeable to the plan. Rx'd prednisone 30mg  qd x 2d, then 20mg  qd x 2d, then 10mg  qd x 3d, then stop. An After Visit Summary was printed and given to the patient.  FOLLOW UP: Return if symptoms worsen or fail to improve.  Signed:  Crissie Sickles, MD           12/09/2016

## 2016-12-09 NOTE — Telephone Encounter (Signed)
Pt coming in today at 3:45pm.

## 2016-12-09 NOTE — Patient Instructions (Signed)
Your eye MD office will contact you to arrange an appointment to get your eye pressure checked in 2-3 weeks.

## 2016-12-13 ENCOUNTER — Encounter: Payer: Self-pay | Admitting: Family Medicine

## 2016-12-14 ENCOUNTER — Telehealth: Payer: Self-pay | Admitting: Family Medicine

## 2016-12-14 MED ORDER — FLUCONAZOLE 150 MG PO TABS: 150.0000 mg | ORAL_TABLET | Freq: Once | ORAL | 0 refills | Status: AC

## 2016-12-14 NOTE — Telephone Encounter (Signed)
Left message with information on patient voice mail per DPR 

## 2016-12-14 NOTE — Telephone Encounter (Signed)
Prescribed diflucan for yeast, 2/2 to abx prescribed by this provider.

## 2016-12-27 ENCOUNTER — Encounter: Payer: Self-pay | Admitting: *Deleted

## 2016-12-27 ENCOUNTER — Other Ambulatory Visit: Payer: Self-pay | Admitting: *Deleted

## 2016-12-27 MED ORDER — OMEPRAZOLE 40 MG PO CPDR: 40.0000 mg | DELAYED_RELEASE_CAPSULE | Freq: Every day | ORAL | 0 refills | Status: DC

## 2016-12-27 NOTE — Telephone Encounter (Signed)
Patient needs office visit prior to anymore refills of omeprazole. Message sent in My Chart.

## 2017-01-11 ENCOUNTER — Encounter: Payer: Self-pay | Admitting: Family Medicine

## 2017-01-11 ENCOUNTER — Ambulatory Visit (INDEPENDENT_AMBULATORY_CARE_PROVIDER_SITE_OTHER): Payer: Medicare Other | Admitting: Family Medicine

## 2017-01-11 VITALS — BP 135/85 | HR 79 | Temp 97.4°F | Resp 20 | Wt 210.5 lb

## 2017-01-11 DIAGNOSIS — F1310 Sedative, hypnotic or anxiolytic abuse, uncomplicated: Secondary | ICD-10-CM

## 2017-01-11 DIAGNOSIS — G4733 Obstructive sleep apnea (adult) (pediatric): Secondary | ICD-10-CM

## 2017-01-11 DIAGNOSIS — K219 Gastro-esophageal reflux disease without esophagitis: Secondary | ICD-10-CM

## 2017-01-11 DIAGNOSIS — G4700 Insomnia, unspecified: Secondary | ICD-10-CM | POA: Diagnosis not present

## 2017-01-11 MED ORDER — OMEPRAZOLE 40 MG PO CPDR: 40.0000 mg | DELAYED_RELEASE_CAPSULE | Freq: Every day | ORAL | 1 refills | Status: DC

## 2017-01-11 MED ORDER — ALPRAZOLAM 0.5 MG PO TABS: 0.5000 mg | ORAL_TABLET | Freq: Every evening | ORAL | 1 refills | Status: DC | PRN

## 2017-01-11 NOTE — Progress Notes (Signed)
Malayah Norton , 1946/03/16, 71 y.o., female MRN: [id number] Patient Care Team    Relationship Specialty Notifications Start End  [name], DO PCP - General Family Medicine  05/26/16   [name], MD Consulting Physician Orthopedic Surgery  03/11/16   [name], NP Nurse Practitioner Nurse Practitioner  05/26/16   [name], MD Consulting Physician Internal Medicine  05/26/16    Comment: endocrine, thyroid  [name], [name], MD Referring Physician Pulmonary Disease  05/26/16   [name], MD Consulting Physician Neurology  05/26/16   [name], MD Consulting Physician Cardiology  05/26/16   [name], MD Consulting Physician Ophthalmology  05/26/16    Comment: Dr Kathlen Mody at [facility] for glaucoma and cataracts every 3 mos  [name], MD Attending Physician Family Medicine  05/26/16    Comment: [facility]    Chief Complaint  Patient presents with  . Anxiety  . Gastroesophageal Reflux    need refill on omeprazole     Subjective:   GERD: Pt reports she feels her GERD is worse in the evening after meals. She has the sensation of increased hunger after eating her large meal. She denies stomach burning, nausea, vomit. She does endorse heartburn. She reports compliance with omeprazole 40 mg QD.  Anxiety with insomnia/OSA with CPAP:  She is doing well on xanax 0.5 mg QHS. She takes it nightly. She can not sleep without medication. She has been counseled on controlled substances. She has a controlled substance contract signed.   Prior note: Patient is prescribed Xanax 0.5 mg daily at bedtime when necessary. She states that sometimes she takes half a tab sometimes she needs a full tab to help her relax . She does endorse nightly use. Patient has a lot of worry and anxiety surrounding the continuation of this medicine. She reports it is what works for her, she has been on it for over 20 years and she would like to be able to continue it. She reports trying other  medications for anxiety in the past, but states that there is a side effect to the medication she is likely to get. She denies any side effects to Xanax use. She denies any falls in the past year. She denies any over sedation with use. Pt reports use of CPAP nightly and has machine/settings adjusted per [employer]. Depression screen [facility] 2/9 09/23/2016 05/26/2016  Decreased Interest 0 0  Down, Depressed, Hopeless 0 0  PHQ - 2 Score 0 0    Allergies  Allergen Reactions  . Avelox [Moxifloxacin Hcl In Nacl]   . Citalopram   . Crestor [Rosuvastatin]   . Dexilant [Dexlansoprazole]   . Escitalopram Oxalate   . Statins     Myalgias, joint pain.  . Wellbutrin [Bupropion]    Social History  Substance Use Topics  . Smoking status: Former Smoker    Packs/day: 0.75    Years: 25.00    Types: Cigarettes    Quit date: 05/25/2007  . Smokeless tobacco: Never Used     Comment: quit for 10 years in the total 65yrs.   . Alcohol use No     Comment: rare   Past Medical History:  Diagnosis Date  . Allergic rhinitis   . Allergy   . Anxiety   . Bilateral carpal tunnel syndrome    Patient scheduled for surgery with neurology  . Cataract   . Chronic constipation   . Chronic headache   . Chronic sinusitis   .  GERD (gastroesophageal reflux disease)   . Glaucoma   . Hyperlipemia   . Hypothyroid   . Leg length discrepancy   . Ocular rosacea   . Plantar fasciitis   . Sebaceous cyst   . Sleep apnea    on CPAP  . Urethral stenosis   . Vitamin D deficiency    Past Surgical History:  Procedure Laterality Date  . ABDOMINAL HYSTERECTOMY  1983  . BREAST SURGERY     sebaceous cyst  . FACIAL COSMETIC SURGERY     1995  . GANGLION CYST EXCISION    . MENISCUS REPAIR Left   . RHINOPLASTY    . SEPTOPLASTY    . TUBAL LIGATION     Family History  Problem Relation Age of Onset  . Emphysema Father   . Alcohol abuse Father   . Allergies Mother   . Heart disease Mother   . Ovarian cancer Mother   .  Hyperlipidemia Mother   . Hypothyroidism Mother   . Arthritis Mother   . Hypothyroidism Sister   . Lung cancer Paternal Aunt   . Diabetes Maternal Grandmother   . Heart disease Maternal Grandmother   . Stroke Paternal Grandfather    Allergies as of 01/11/2017      Reactions   Avelox [moxifloxacin Hcl In Nacl]    Citalopram    Crestor [rosuvastatin]    Dexilant [dexlansoprazole]    Escitalopram Oxalate    Statins    Myalgias, joint pain.   Wellbutrin [bupropion]       Medication List       Accurate as of 01/11/17  9:50 AM. Always use your most recent med list.          acetaminophen 500 MG tablet Commonly known as:  TYLENOL Take 500 mg by mouth every 6 (six) hours as needed.   AFRIN ALLERGY NA Place into the nose.   ALLEGRA PO Take by mouth.   ALPRAZolam 0.5 MG tablet Commonly known as:  XANAX Take 1 tablet (0.5 mg total) by mouth at bedtime as needed.   ARMOUR THYROID 60 MG tablet Generic drug:  thyroid Take 1 tablet (60 mg total) by mouth daily.   Biotin 10 MG Caps Take by mouth.   BLINK TEARS OP Apply to eye at bedtime as needed.   Magnesium 100 MG Caps Take 400 mg by mouth.   meloxicam 15 MG tablet Commonly known as:  MOBIC Take 1 tablet (15 mg total) by mouth as needed for pain.   MSM PO Take 1 tablet by mouth daily.   omeprazole 40 MG capsule Commonly known as:  PRILOSEC Take 1 capsule (40 mg total) by mouth daily. Needs office visit prior to anymore refills   OVER THE COUNTER MEDICATION Stool softner   PERDIEM PO Take 3 tablets by mouth at bedtime.   SUDAFED PO Take by mouth.   Vitamin D (Ergocalciferol) 50000 units Caps capsule Commonly known as:  DRISDOL Take 1 capsule (50,000 Units total) by mouth every 7 (seven) days.   XIIDRA 5 % Soln Generic drug:  Lifitegrast INSTILL 1 DROP IN BOTH EYES TWICE A DAY       All past medical history, surgical history, allergies, family history, immunizations andmedications were updated in  the EMR today and reviewed under the history and medication portions of their EMR.     ROS: Negative, with the exception of above mentioned in HPI   Objective:  BP 135/85 (BP Location: Left Arm, Patient Position: Sitting,  Cuff Size: Normal)   Pulse 79   Temp (!) 97.4 F (36.3 C)   Resp 20   Wt 210 lb 8 oz (95.5 kg)   SpO2 97%   BMI 32.97 kg/m  Body mass index is 32.97 kg/m. Gen: Afebrile. No acute distress.  HENT: AT. [place].  MMM.  Eyes:Pupils Equal Round Reactive to light, Extraocular movements intact,  Conjunctiva without redness, discharge or icterus. Neck/lymp/endocrine: Supple,no lymphadenopathy, CV: RRR, no edema, +2/4 P posterior tibialis pulses Chest: CTAB, no wheeze or crackles Abd: Soft. NTND. BS present.  Neuro:  Normal gait. PERLA. EOMi. Alert. Oriented x3 Psych: Normal affect, dress and demeanor. Normal speech. Normal thought content and judgment.   No exam data present No results found. No results found for this or any previous visit (from the past 24 hour(s)).  Assessment/Plan: Carol Norton is a 71 y.o. female present for OV for  Anxiety Insomnia/OSA/anxiety disorder- Patient was made aware of addictive properties of Xanax, beers criteria and controlled substance requirements. - 12 month review of Controlled substance database was reviewed today 01/11/2017 and appropriate. - Controlled substance contract for benzodiazepines has been signed. - Follow-up 6 months  Note is dictated utilizing voice recognition software. Although note has been proof read prior to signing, occasional typographical errors still can be missed. If any questions arise, please do not hesitate to call for verification.   electronically signed by:  Howard Pouch, DO  [place]    --------------------------------------------------------------------------+++++++++++++++++++++++++++++++++++++++++++++++++++

## 2017-01-11 NOTE — Patient Instructions (Signed)
It was nice to see you today.  Follow up every 6 months for xanax refill if needed. Appts for controlled substances need to be covered in an appt for that issue alone.    Please help [place] help you:  We are honored you have chosen [facility] for your Primary Care home. Below you will find basic instructions that you may need to access in the future. Please help [place] help you by reading the instructions, which cover many of the frequent questions we experience.   Prescription refills and request:  -In order to allow more efficient response time, please call your pharmacy for all refills. They will forward the request electronically to [place]. This allows for the quickest possible response. Request left on a nurse line can take longer to refill, since these are checked as time allows between office patients and other phone calls.  - refill request can take up to 3-5 working days to complete.  - If request is sent electronically and request is appropiate, it is usually completed in 1-2 business days.  - all patients will need to be seen routinely for all chronic medical conditions requiring prescription medications (see follow-up below). If you are overdue for follow up on your condition, you will be asked to make an appointment and we will call in enough medication to cover you until your appointment (up to 30 days).  - all controlled substances will require a face to face visit to request/refill.  - if you desire your prescriptions to go through a new pharmacy, and have an active script at original pharmacy, you will need to call your pharmacy and have scripts transferred to new pharmacy. This is completed between the pharmacy locations and not by your provider.    Results: If any images or labs were ordered, it can take up to 1 week to get results depending on the test ordered and the lab/facility running and resulting the test. - Normal or stable results, which do not need further discussion, may be  released to your mychart immediately with attached note to you. A call may not be generated for normal results. Please make certain to sign up for mychart. If you have questions on how to activate your mychart you can call the front office.  - If your results need further discussion, our office will attempt to contact you via phone, and if unable to reach you after 2 attempts, we will release your abnormal result to your mychart with instructions.  - All results will be automatically released in mychart after 1 week.  - Your provider will provide you with explanation and instruction on all relevant material in your results. Please keep in mind, results and labs may appear confusing or abnormal to the untrained eye, but it does not mean they are actually abnormal for you personally. If you have any questions about your results that are not covered, or you desire more detailed explanation than what was provided, you should make an appointment with your provider to do so.   Our office handles many outgoing and incoming calls daily. If we have not contacted you within 1 week about your results, please check your mychart to see if there is a message first and if not, then contact our office.  In helping with this matter, you help decrease call volume, and therefore allow [place] to be able to respond to patients needs more efficiently.   Acute office visits (sick visit):  An acute visit is intended  for a new problem and are scheduled in shorter time slots to allow schedule openings for patients with new problems. This is the appropriate visit to discuss a new problem. In order to provide you with excellent quality medical care with proper time for you to explain your problem, have an exam and receive treatment with instructions, these appointments should be limited to one new problem per visit. If you experience a new problem, in which you desire to be addressed, please make an acute office visit, we save openings on  the schedule to accommodate you. Please do not save your new problem for any other type of visit, let us take care of it properly and quickly for you.   Follow up visits:  Depending on your condition(s) your provider will need to see you routinely in order to provide you with quality care and prescribe medication(s). Most chronic conditions (Example: hypertension, Diabetes, depression/anxiety... etc), require visits a couple times a year. Your provider will instruct you on proper follow up for your personal medical conditions and history. Please make certain to make follow up appointments for your condition as instructed. Failing to do so could result in lapse in your medication treatment/refills. If you request a refill, and are overdue to be seen on a condition, we will always provide you with a 30 day script (once) to allow you time to schedule.    Medicare wellness (well visit): - we have a wonderful Nurse Maudie Mercury), that will meet with you and provide you will yearly medicare wellness visits. These visits should occur yearly (can not be scheduled less than 1 calendar year apart) and cover preventive health, immunizations, advance directives and screenings you are entitled to yearly through your medicare benefits. Do not miss out on your entitled benefits, this is when medicare will pay for these benefits to be ordered for you.  These are strongly encouraged by your provider and is the appropriate type of visit to make certain you are up to date with all preventive health benefits. If you have not had your medicare wellness exam in the last 12 months, please make certain to schedule one by calling the office and schedule your medicare wellness with Maudie Mercury as soon as possible.   Yearly physical (well visit):  - Adults are recommended to be seen yearly for physicals. Check with your insurance and date of your last physical, most insurances require one calendar year between physicals. Physicals include all  preventive health topics, screenings, medical exam and labs that are appropriate for gender/age and history. You may have fasting labs needed at this visit. This is a well visit (not a sick visit), new problems should not be covered during this visit (see acute visit).  - Pediatric patients are seen more frequently when they are younger. Your provider will advise you on well child visit timing that is appropriate for your their age. - This is not a medicare wellness visit. Medicare wellness exams do not have an exam portion to the visit. Some medicare companies allow for a physical, some do not allow a yearly physical. If your medicare allows a yearly physical you can schedule the medicare wellness with our nurse Maudie Mercury and have your physical with your provider after, on the same day. Please check with insurance for your full benefits.   Late Policy/No Shows:  - all new patients should arrive 15-30 minutes earlier than appointment to allow [place] time  to  obtain all personal demographics,  insurance information and for you  to complete office paperwork. - All established patients should arrive 10-15 minutes earlier than appointment time to update all information and be checked in .  - In our best efforts to run on time, if you are late for your appointment you will be asked to either reschedule or if able, we will work you back into the schedule. There will be a wait time to work you back in the schedule,  depending on availability.  - If you are unable to make it to your appointment as scheduled, please call 24 hours ahead of time to allow [place] to fill the time slot with someone else who needs to be seen. If you do not cancel your appointment ahead of time, you may be charged a no show fee.

## 2017-01-25 ENCOUNTER — Ambulatory Visit (INDEPENDENT_AMBULATORY_CARE_PROVIDER_SITE_OTHER): Payer: Medicare Other | Admitting: Allergy and Immunology

## 2017-01-25 ENCOUNTER — Encounter: Payer: Self-pay | Admitting: Allergy and Immunology

## 2017-01-25 VITALS — BP 158/78 | HR 72 | Temp 97.7°F | Resp 14 | Ht 65.6 in | Wt 209.8 lb

## 2017-01-25 DIAGNOSIS — L718 Other rosacea: Secondary | ICD-10-CM

## 2017-01-25 DIAGNOSIS — J3089 Other allergic rhinitis: Secondary | ICD-10-CM | POA: Diagnosis not present

## 2017-01-25 DIAGNOSIS — H1013 Acute atopic conjunctivitis, bilateral: Secondary | ICD-10-CM

## 2017-01-25 DIAGNOSIS — H04123 Dry eye syndrome of bilateral lacrimal glands: Secondary | ICD-10-CM

## 2017-01-25 DIAGNOSIS — H1045 Other chronic allergic conjunctivitis: Secondary | ICD-10-CM

## 2017-01-25 MED ORDER — MONTELUKAST SODIUM 10 MG PO TABS: 10.0000 mg | ORAL_TABLET | Freq: Every day | ORAL | 5 refills | Status: DC

## 2017-01-25 NOTE — Progress Notes (Signed)
Dear Dr. Raoul Pitch,  Thank you for referring Carol Norton to the [facility] on 01/25/2017.   Below is a summation of this patient's evaluation and recommendations.  Thank you for your referral. I will keep you informed about this patient's response to treatment.   If you have any questions please do not hesitate to contact me.   Sincerely,  Jiles Prows, MD Allergy / Immunology [place]   ______________________________________________________________________    NEW PATIENT NOTE  Referring Provider: [name], DO Primary Provider: [name], DO Date of office visit: 01/25/2017    Subjective:   Chief Complaint:  Carol Norton (DOB: [birth year]) is a 71 y.o. female who presents to the clinic on 01/25/2017 with a chief complaint of Nasal Congestion .     HPI: Carol Norton returns to this clinic in evaluation of persistent allergy problems. Apparently we had seen her in this clinic many years ago for a similar issue at which point in time she appeared to be allergic to house dust mite.  She notes that throughout the year she has intermittent issues with nasal congestion and sneezing. Sometimes she will develop a "sinusitis" episode with her last episode occurring in July for which she received antibiotics and systemic steroids. Her chronic upper airway symptoms are usually precipitated by exposure to dust or emotional situations. She does not really use any specific type of therapy such as nasal steroids for inflammation of her upper airway. There is an issue of glaucoma for which she has been told not to use any nasal steroids. She will use nasal saline on a regular basis. She has performed house dust avoidance measures in the past. She does not have a history of chronic anosmia or chronic ugly nasal discharge or chronic headaches.  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 She has a tough time tolerating this dose of doxycycline because of chronic nausea. She has discontinued this agent since July because of that issue. She does have dry eye as well. Her glaucoma apparently is defined by a chronic pressure of 20 while utilizing multiple medications and undergoing laser therapy.  Past Medical History:  Diagnosis Date  . Allergic rhinitis   . Allergy   . Anxiety   . Bilateral carpal tunnel syndrome    Patient scheduled for surgery with neurology  . Cataract   . Chronic constipation   . Chronic headache   . Chronic sinusitis   . GERD (gastroesophageal reflux disease)   . Glaucoma   . Hyperlipemia   . Hypothyroid   . Leg length discrepancy   . Ocular rosacea   . Plantar fasciitis   . Sebaceous cyst   . Sleep apnea    on CPAP  . Urethral stenosis   . Vitamin D deficiency     Past Surgical History:  Procedure Laterality Date  . ABDOMINAL HYSTERECTOMY  1983  . BREAST SURGERY     sebaceous cyst  . EYE SURGERY     surgery with laser  . FACIAL COSMETIC SURGERY     1995  . GANGLION CYST EXCISION    . MENISCUS REPAIR Left   . RHINOPLASTY    . SEPTOPLASTY    . TUBAL LIGATION      Allergies as of 01/25/2017      Reactions   Avelox [moxifloxacin Hcl In Nacl]  Citalopram    Crestor [rosuvastatin]    Dexilant [dexlansoprazole]    Escitalopram Oxalate    Statins    Myalgias, joint pain.   Wellbutrin [bupropion]       Medication List      acetaminophen 500 MG tablet Commonly known as:  TYLENOL Take 500 mg by mouth every 6 (six) hours as needed.   ALLEGRA PO Take by mouth.   ALPRAZolam 0.5 MG tablet Commonly known as:  XANAX Take 1 tablet (0.5 mg total) by mouth at bedtime as needed.   ARMOUR THYROID 60 MG tablet Generic drug:  thyroid Take 1 tablet (60 mg total) by mouth daily.   Biotin 10 MG Caps Take by mouth.   BLINK TEARS OP Apply to eye at bedtime as needed.   doxycycline 100 MG  tablet Commonly known as:  VIBRA-TABS   Magnesium 100 MG Caps Take 400 mg by mouth.   meloxicam 15 MG tablet Commonly known as:  MOBIC Take 1 tablet (15 mg total) by mouth as needed for pain.   MSM PO Take 1 tablet by mouth daily.   omeprazole 40 MG capsule Commonly known as:  PRILOSEC Take 1 capsule (40 mg total) by mouth daily. Needs office visit prior to anymore refills   OVER THE COUNTER MEDICATION Stool softner   PERDIEM PO Take 3 tablets by mouth at bedtime.   TUMS PO Take by mouth every morning.   Vitamin D (Ergocalciferol) 50000 units Caps capsule Commonly known as:  DRISDOL Take 1 capsule (50,000 Units total) by mouth every 7 (seven) days.   XIIDRA 5 % Soln Generic drug:  Lifitegrast INSTILL 1 DROP IN BOTH EYES TWICE A DAY       Review of systems negative except as noted in HPI / PMHx or noted below:  Review of Systems  Constitutional: Negative.   HENT: Negative.   Eyes: Negative.   Respiratory: Negative.   Cardiovascular: Negative.   Gastrointestinal: Negative.   Genitourinary: Negative.   Musculoskeletal: Negative.   Skin: Negative.   Neurological: Negative.   Endo/Heme/Allergies: Negative.   Psychiatric/Behavioral: Negative.     Family History  Problem Relation Age of Onset  . Emphysema Father   . Alcohol abuse Father   . Allergies Mother   . Heart disease Mother   . Ovarian cancer Mother   . Hyperlipidemia Mother   . Hypothyroidism Mother   . Arthritis Mother   . Hypothyroidism Sister   . Lung cancer Paternal Aunt   . Diabetes Maternal Grandmother   . Heart disease Maternal Grandmother   . Stroke Paternal Grandfather     Social History   Social History  . Marital status: Married    Spouse name: [name]  . Number of children: 2  . Years of education: 13   Occupational History  . retired    Social History Main Topics  . Smoking status: Former Smoker    Packs/day: 0.75    Years: 25.00    Types: Cigarettes    Quit date:  05/25/2007  . Smokeless tobacco: Never Used     Comment: quit for 10 years in the total 34yrs.   . Alcohol use No     Comment: rare  . Drug use: No  . Sexual activity: Yes    Partners: Male    Birth control/ protection: Surgical     Comment: Married   Other Topics Concern  . Not on file   Social History Narrative   Married to [place].  2 children.   Dinks caffeine.   Patient is right handed.    Exercises routinely.   Wears seatbelt, smoke detector at home.   Feels safe in relationship.    Environmental and Social history  Lives in a house with a dry environment, a dog and burred located inside the household, plastic on the bed, plastic on the pillow, no smoking ongoing with inside the household.  Objective:   Vitals:   01/25/17 0953  BP: (!) 158/78  Pulse: 72  Resp: 14  Temp: 97.7 F (36.5 C)   Height: 5' 5.6" (166.6 cm) Weight: 209 lb 12.8 oz (95.2 kg)  Physical Exam  Constitutional: She is well-developed, well-nourished, and in no distress.  HENT:  Head: Normocephalic. Head is without right periorbital erythema and without left periorbital erythema.  Right Ear: Tympanic membrane, external ear and ear canal normal.  Left Ear: Tympanic membrane, external ear and ear canal normal.  Nose: Mucosal edema present. No rhinorrhea.  Mouth/Throat: Oropharynx is clear and moist and mucous membranes are normal. No oropharyngeal exudate.  Eyes: Pupils are equal, round, and reactive to light. Lids are normal. Right conjunctiva is injected. Left conjunctiva is injected.  Neck: Trachea normal. No tracheal deviation present. No thyromegaly present.  Cardiovascular: Normal rate, regular rhythm, S1 normal, S2 normal and normal heart sounds.   No murmur heard. Pulmonary/Chest: Effort normal. No stridor. No tachypnea. No respiratory distress. She has no wheezes. She has no rales. She exhibits no tenderness.  Abdominal: Soft. She exhibits no distension and no mass. There is no  hepatosplenomegaly. There is no tenderness. There is no rebound and no guarding.  Musculoskeletal: She exhibits no edema or tenderness.  Lymphadenopathy:       Head (right side): No tonsillar adenopathy present.       Head (left side): No tonsillar adenopathy present.    She has no cervical adenopathy.    She has no axillary adenopathy.  Neurological: She is alert. Gait normal.  Skin: Rash (slight facial erythema and induration) noted. She is not diaphoretic. No erythema. No pallor. Nails show no clubbing.  Psychiatric: Mood and affect normal.    Diagnostics: Allergy skin tests were performed. She did not demonstrate any significant hypersensitivity against a screening panel of aeroallergens or foods.  Assessment and Plan:    1. Other allergic rhinitis   2. Perennial allergic conjunctivitis of both eyes   3. Ocular rosacea   4. Dry eye syndrome of both eyes     1. Allergen avoidance measures?  2. Treat and prevent inflammation:   A. OTC Nasacort 1 spray each nostril one time per day (check eye pressure)  B. montelukast 10 mg tablet 1 time per day  3. Treat and prevent ocular rosacea and dry eye:   A. try to decrease doxycycline to 50 mg daily (1/2 tablet)  B. try to remain away from all oral antihistamines  4. If needed:   A. nasal saline  5. Consider a course of immunotherapy? Will require aeroallergen IgE profile  6. Return to clinic in 4 weeks or earlier if problem  I'm going to have Jeanne utilize anti-inflammatory medications on a regular basis to see if we can get her upper airway symptoms under better control. We may need to have her undergo further evaluation for atopic disease if she does not respond adequately to this and her treatment. As well, I have encouraged her to treat her ocular rosacea any further dose of doxycycline that gives her benefit  but does not produce chronic nausea. Given her dry eye syndrome it would be best for her to remain away from all oral  antihistamines at this point. I will see her back in this clinic in 4 weeks.  Jiles Prows, MD Allergy / Immunology [facility]

## 2017-01-25 NOTE — Patient Instructions (Addendum)
  1. Allergen avoidance measures?  2. Treat and prevent inflammation:   A. OTC Nasacort 1 spray each nostril one time per day (check eye pressure)  B. montelukast 10 mg tablet 1 time per day  3. Treat and prevent ocular rosacea and dry eye:   A. try to decrease doxycycline to 50 mg daily (1/2 tablet)  B. try to remain away from all oral antihistamines  4. If needed:   A. nasal saline  5. Consider a course of immunotherapy. Will require aeroallergen IgE profile  6. Return to clinic in 4 weeks or earlier if problem

## 2017-02-03 ENCOUNTER — Telehealth: Payer: Self-pay | Admitting: Allergy and Immunology

## 2017-02-03 NOTE — Telephone Encounter (Signed)
Patient called and believes she is having side effects from her Singulair that she was prescribed on 01-25-17. She says she feels different since she started taking this. She feels aggression and edginess that she cannot control. She read up on the side effects and believes it is from the Singulair.

## 2017-02-03 NOTE — Telephone Encounter (Signed)
Advised patient to stop taking medication states she did not take last night. Dr Neldon Mc please advise if you would like a change in medications.

## 2017-02-12 ENCOUNTER — Other Ambulatory Visit: Payer: Self-pay | Admitting: Internal Medicine

## 2017-02-23 ENCOUNTER — Ambulatory Visit: Payer: Medicare Other | Admitting: Allergy and Immunology

## 2017-02-23 ENCOUNTER — Ambulatory Visit (INDEPENDENT_AMBULATORY_CARE_PROVIDER_SITE_OTHER): Payer: Medicare Other | Admitting: Allergy and Immunology

## 2017-02-23 ENCOUNTER — Encounter: Payer: Self-pay | Admitting: Allergy and Immunology

## 2017-02-23 VITALS — BP 124/74 | HR 80 | Resp 16

## 2017-02-23 DIAGNOSIS — H1045 Other chronic allergic conjunctivitis: Secondary | ICD-10-CM | POA: Diagnosis not present

## 2017-02-23 DIAGNOSIS — J3089 Other allergic rhinitis: Secondary | ICD-10-CM

## 2017-02-23 DIAGNOSIS — L718 Other rosacea: Secondary | ICD-10-CM | POA: Diagnosis not present

## 2017-02-23 DIAGNOSIS — H04123 Dry eye syndrome of bilateral lacrimal glands: Secondary | ICD-10-CM | POA: Diagnosis not present

## 2017-02-23 DIAGNOSIS — H1013 Acute atopic conjunctivitis, bilateral: Secondary | ICD-10-CM

## 2017-02-23 MED ORDER — ZAFIRLUKAST 20 MG PO TABS: 20.0000 mg | ORAL_TABLET | Freq: Two times a day (BID) | ORAL | 5 refills | Status: DC

## 2017-02-23 NOTE — Progress Notes (Signed)
Follow-up Note  Referring Provider: [name], DO Primary Provider: [name], DO Date of Office Visit: 02/23/2017  Subjective:   Carol Norton (DOB: [birth year]) is a 71 y.o. female who returns to the [facility] on 02/23/2017 in re-evaluation of the following:  HPI: Carol Norton returns to this clinic in reevaluation of her allergic rhinitis and ocular rosacea and dry eye syndrome. I last saw her in this clinic during her initial evaluation of 01/25/2017.  While using a combination of Nasacort and Singulair she did quite well and had basically resolved almost all the issues with her upper respiratory tract. However, she became quite aggressive while using Singulair and discontinued this agent and has once again redeveloped nasal congestion.  Her eyes are doing wonderful as long she continues on doxycycline. She does develop nausea occasionally with doxycycline but if she takes her doxycycline on a full stomach she can tolerate 100 mg once a day. She will use 50 mg once a day if she needs to take this drug not on a full stomach.  She has glaucoma but she has not had her eye pressure measured since she started her Nasacort. She is scheduled to see her ophthalmologist within a week or 2.  Allergies as of 02/23/2017      Reactions   Avelox [moxifloxacin Hcl In Nacl]    Citalopram    Crestor [rosuvastatin]    Dexilant [dexlansoprazole]    Escitalopram Oxalate    Statins    Myalgias, joint pain.   Wellbutrin [bupropion]       Medication List      ALIGN PO Take by mouth daily.   ALPRAZolam 0.5 MG tablet Commonly known as:  XANAX Take 1 tablet (0.5 mg total) by mouth at bedtime as needed.   ARMOUR THYROID 60 MG tablet Generic drug:  thyroid Take 1 tablet (60 mg total) by mouth daily.   Biotin 10 MG Caps Take by mouth.   doxycycline 100 MG tablet Commonly known as:  VIBRA-TABS   Magnesium 100 MG Caps Take 400 mg by mouth.   meloxicam 15 MG  tablet Commonly known as:  MOBIC Take 1 tablet (15 mg total) by mouth as needed for pain.   MIRALAX powder Generic drug:  polyethylene glycol powder Take 1 Container by mouth once.   montelukast 10 MG tablet Commonly known as:  SINGULAIR Take 1 tablet (10 mg total) by mouth daily.   omeprazole 40 MG capsule Commonly known as:  PRILOSEC Take 1 capsule (40 mg total) by mouth daily. Needs office visit prior to anymore refills   OVER THE COUNTER MEDICATION Retaine eye drops   PERDIEM PO Take 3 tablets by mouth at bedtime.   TUMS PO Take by mouth every morning.   Vitamin D (Ergocalciferol) 50000 units Caps capsule Commonly known as:  DRISDOL Take 1 capsule (50,000 Units total) by mouth every 7 (seven) days.   XIIDRA 5 % Soln Generic drug:  Lifitegrast INSTILL 1 DROP IN BOTH EYES TWICE A DAY       Past Medical History:  Diagnosis Date  . Allergic rhinitis   . Allergy   . Anxiety   . Bilateral carpal tunnel syndrome    Patient scheduled for surgery with neurology  . Cataract   . Chronic constipation   . Chronic headache   . Chronic sinusitis   . GERD (gastroesophageal reflux disease)   . Glaucoma   . Hyperlipemia   . Hypothyroid   .  Leg length discrepancy   . Ocular rosacea   . Plantar fasciitis   . Sebaceous cyst   . Sleep apnea    on CPAP  . Urethral stenosis   . Vitamin D deficiency     Past Surgical History:  Procedure Laterality Date  . ABDOMINAL HYSTERECTOMY  1983  . BREAST SURGERY     sebaceous cyst  . EYE SURGERY     surgery with laser  . FACIAL COSMETIC SURGERY     1995  . GANGLION CYST EXCISION    . MENISCUS REPAIR Left   . RHINOPLASTY    . SEPTOPLASTY    . TUBAL LIGATION      Review of systems negative except as noted in HPI / PMHx or noted below:  Review of Systems  Constitutional: Negative.   HENT: Negative.   Eyes: Negative.   Respiratory: Negative.   Cardiovascular: Negative.   Gastrointestinal: Negative.   Genitourinary:  Negative.   Musculoskeletal: Negative.   Skin: Negative.   Neurological: Negative.   Endo/Heme/Allergies: Negative.   Psychiatric/Behavioral: Negative.      Objective:   Vitals:   02/23/17 1026  BP: 124/74  Pulse: 80  Resp: 16          Physical Exam  Constitutional: She is well-developed, well-nourished, and in no distress.  HENT:  Head: Normocephalic.  Right Ear: Tympanic membrane, external ear and ear canal normal.  Left Ear: Tympanic membrane, external ear and ear canal normal.  Nose: Nose normal. No mucosal edema or rhinorrhea.  Mouth/Throat: Uvula is midline, oropharynx is clear and moist and mucous membranes are normal. No oropharyngeal exudate.  Eyes: Right conjunctiva is injected. Left conjunctiva is injected.  Neck: Trachea normal. No tracheal tenderness present. No tracheal deviation present. No thyromegaly present.  Cardiovascular: Normal rate, regular rhythm, S1 normal, S2 normal and normal heart sounds.   No murmur heard. Pulmonary/Chest: Breath sounds normal. No stridor. No respiratory distress. She has no wheezes. She has no rales.  Musculoskeletal: She exhibits no edema.  Lymphadenopathy:       Head (right side): No tonsillar adenopathy present.       Head (left side): No tonsillar adenopathy present.    She has no cervical adenopathy.  Neurological: She is alert. Gait normal.  Skin: No rash noted. She is not diaphoretic. No erythema. Nails show no clubbing.  Psychiatric: Mood and affect normal.    Diagnostics: none   Assessment and Plan:   1. Other allergic rhinitis   2. Perennial allergic conjunctivitis of both eyes   3. Ocular rosacea   4. Dry eye syndrome of both eyes     1. Continue to Treat and prevent inflammation:   A. OTC Nasacort 1 spray each nostril one time per day (check eye pressure)  B. Accolate 20 mg one tablet 1-2 times a day  2. Continue to Treat and prevent ocular rosacea and dry eye:   A. doxycycline to 50 mg - 100 mg  daily   B. try to remain away from all oral antihistamines  3. If needed:   A. nasal saline  4. Obtain fall flu vaccine  5. Return to clinic in 12 weeks or earlier if problem  Jakylah will attempt a different kind of a leukotriene modifier using Accolate one or 2 times per day to see if this gives her benefit regarding her nasal symptoms while she also continues to use Nasacort. She needs to have her eye pressure checked and she will be  visiting with her ophthalmologist sometime soon. She will remain on doxycycline for her ocular rosacea. I will see her back in this clinic in 12 weeks or earlier if there is a problem.  Allena Katz, MD Allergy / Immunology [place]

## 2017-02-23 NOTE — Patient Instructions (Addendum)
  1. Continue to Treat and prevent inflammation:   A. OTC Nasacort 1 spray each nostril one time per day (check eye pressure)  B. Accolate 20 mg one tablet 1-2 times a day  2. Continue to Treat and prevent ocular rosacea and dry eye:   A. doxycycline to 50 mg - 100 mg daily   B. try to remain away from all oral antihistamines  3. If needed:   A. nasal saline  4. Obtain fall flu vaccine  5. Return to clinic in 12 weeks or earlier if problem

## 2017-03-24 ENCOUNTER — Encounter: Payer: Self-pay | Admitting: Family Medicine

## 2017-03-24 ENCOUNTER — Ambulatory Visit (INDEPENDENT_AMBULATORY_CARE_PROVIDER_SITE_OTHER): Payer: Medicare Other | Admitting: Family Medicine

## 2017-03-24 VITALS — BP 132/74 | HR 85 | Temp 97.9°F | Resp 20 | Wt 207.8 lb

## 2017-03-24 DIAGNOSIS — J0191 Acute recurrent sinusitis, unspecified: Secondary | ICD-10-CM

## 2017-03-24 MED ORDER — AMOXICILLIN-POT CLAVULANATE 875-125 MG PO TABS: 1.0000 | ORAL_TABLET | Freq: Two times a day (BID) | ORAL | 0 refills | Status: DC

## 2017-03-24 MED ORDER — PREDNISONE 20 MG PO TABS: ORAL_TABLET | ORAL | 0 refills | Status: DC

## 2017-03-24 NOTE — Patient Instructions (Addendum)
1. Start augmentin today every 12 hr for 10 days 2. If your eye doctor approves, then you can take a very short course of prednisone or if you do not respond to the antibiotics alone.   3. If you decide on ENT referral let me know and we will place it for you .  4. Try allegra daily  If able to tolerate   Filtereasy....Marland KitchenMarland Kitchen

## 2017-03-24 NOTE — Progress Notes (Signed)
Carol Norton , [birth year], 72 y.o., female MRN: [id number] Patient Care Team    Relationship Specialty Notifications Start End  [name], DO PCP - General Family Medicine  05/26/16   [name], MD Consulting Physician Orthopedic Surgery  03/11/16   [name], NP Nurse Practitioner Nurse Practitioner  05/26/16   [name], MD Consulting Physician Internal Medicine  05/26/16    Comment: endocrine, thyroid  [name], [name], MD Referring Physician Pulmonary Disease  05/26/16   [name], MD Consulting Physician Neurology  05/26/16   [name], MD Consulting Physician Cardiology  05/26/16   [name], MD Consulting Physician Ophthalmology  05/26/16    Comment: Dr Kathlen Mody at [facility] for glaucoma and cataracts every 3 mos  [name], MD Attending Physician Family Medicine  05/26/16    Comment: [facility]    Chief Complaint  Patient presents with  . URI    cough,congestion x 1 day     Subjective: Pt presents for an OV with complaints of nasal congestion of 1-2 days duration.  Associated symptoms include patient reports nasal congestion, burning, ear discomfort, sinus pressure that has worsened over the last 1-2 days. She reports it had become bad enough last night she was unable to use her CPAP secondary to the congestion. She has had a similar circumstance over the summer, in which she needed a low-dose prednisone burst. She is worried that it's going into the weekend and she will be able to breathe through her nose. Patient does have history of glaucoma, complicating matters on management. Patient has seen an allergist after last episode, and has been tried on Singulair and Accolate, with side effects to both medications. She is currently using Nasacort, under the supervision of her ophthalmologist. In her eye pressures have been stable. She avoids antihistamines secondary to dryness in the potential side effects with her cough, which she has tolerated  Allegra in the past.  Depression screen [facility] 2/9 09/23/2016 05/26/2016  Decreased Interest 0 0  Down, Depressed, Hopeless 0 0  PHQ - 2 Score 0 0    Allergies  Allergen Reactions  . Synthroid [Levothyroxine Sodium] Shortness Of Breath  . Avelox [Moxifloxacin Hcl In Nacl]   . Citalopram   . Crestor [Rosuvastatin]   . Dexilant [Dexlansoprazole]   . Escitalopram Oxalate   . Singulair [Montelukast Sodium]     Aggression,anger  . Statins     Myalgias, joint pain.  . Wellbutrin [Bupropion]    Social History  Substance Use Topics  . Smoking status: Former Smoker    Packs/day: 0.75    Years: 25.00    Types: Cigarettes    Quit date: 05/25/2007  . Smokeless tobacco: Never Used     Comment: quit for 10 years in the total 36yrs.   . Alcohol use No     Comment: rare   Past Medical History:  Diagnosis Date  . Allergic rhinitis   . Allergy   . Anxiety   . Bilateral carpal tunnel syndrome    Patient scheduled for surgery with neurology  . Cataract   . Chronic constipation   . Chronic headache   . Chronic sinusitis   . GERD (gastroesophageal reflux disease)   . Glaucoma   . Hyperlipemia   . Hypothyroid   . Leg length discrepancy   . Ocular rosacea   . Plantar fasciitis   . Sebaceous cyst   . Sleep apnea    on CPAP  .  Urethral stenosis   . Vitamin D deficiency    Past Surgical History:  Procedure Laterality Date  . ABDOMINAL HYSTERECTOMY  1983  . BREAST SURGERY     sebaceous cyst  . EYE SURGERY     surgery with laser  . FACIAL COSMETIC SURGERY     1995  . GANGLION CYST EXCISION    . MENISCUS REPAIR Left   . RHINOPLASTY    . SEPTOPLASTY    . TUBAL LIGATION     Family History  Problem Relation Age of Onset  . Emphysema Father   . Alcohol abuse Father   . Allergies Mother   . Heart disease Mother   . Ovarian cancer Mother   . Hyperlipidemia Mother   . Hypothyroidism Mother   . Arthritis Mother   . Hypothyroidism Sister   . Lung cancer Paternal Aunt   . Diabetes  Maternal Grandmother   . Heart disease Maternal Grandmother   . Stroke Paternal Grandfather    Allergies as of 03/24/2017      Reactions   Synthroid [levothyroxine Sodium] Shortness Of Breath   Avelox [moxifloxacin Hcl In Nacl]    Citalopram    Crestor [rosuvastatin]    Dexilant [dexlansoprazole]    Escitalopram Oxalate    Singulair [montelukast Sodium]    Aggression,anger   Statins    Myalgias, joint pain.   Wellbutrin [bupropion]       Medication List       Accurate as of 03/24/17 10:44 AM. Always use your most recent med list.          ALIGN PO Take by mouth daily.   ALPRAZolam 0.5 MG tablet Commonly known as:  XANAX Take 1 tablet (0.5 mg total) by mouth at bedtime as needed.   ARMOUR THYROID 60 MG tablet Generic drug:  thyroid Take 1 tablet (60 mg total) by mouth daily.   Biotin 10 MG Caps Take by mouth.   doxycycline 100 MG tablet Commonly known as:  VIBRA-TABS Take 100 mg by mouth daily.   Magnesium 100 MG Caps Take 400 mg by mouth.   meloxicam 15 MG tablet Commonly known as:  MOBIC Take 1 tablet (15 mg total) by mouth as needed for pain.   MIRALAX powder Generic drug:  polyethylene glycol powder Take 1 Container by mouth once.   omeprazole 40 MG capsule Commonly known as:  PRILOSEC Take 1 capsule (40 mg total) by mouth daily. Needs office visit prior to anymore refills   OVER THE COUNTER MEDICATION Retaine eye drops   PERDIEM PO Take 3 tablets by mouth at bedtime.   TUMS PO Take by mouth every morning.   Vitamin D (Ergocalciferol) 50000 units Caps capsule Commonly known as:  DRISDOL Take 1 capsule (50,000 Units total) by mouth every 7 (seven) days.   XIIDRA 5 % Soln Generic drug:  Lifitegrast INSTILL 1 DROP IN BOTH EYES TWICE A DAY   zafirlukast 20 MG tablet Commonly known as:  ACCOLATE Take 1 tablet (20 mg total) by mouth 2 (two) times daily before a meal.       All past medical history, surgical history, allergies, family  history, immunizations andmedications were updated in the EMR today and reviewed under the history and medication portions of their EMR.     ROS: Negative, with the exception of above mentioned in HPI   Objective:  BP 132/74 (BP Location: Left Arm, Patient Position: Sitting, Cuff Size: Large)   Pulse 85   Temp 97.9 F (  36.6 C)   Resp 20   Wt 207 lb 12 oz (94.2 kg)   SpO2 95%   BMI 33.94 kg/m  Body mass index is 33.94 kg/m. Gen: Afebrile. No acute distress. Nontoxic in appearance, well developed, well nourished.  HENT: AT. [place]. Left TM visualized without erythema, swelling or fullness right TM with cerumen impaction.. MMM, no oral lesions. Bilateral nares with moderate erythema, drainage and swelling. Throat without erythema or exudates. Mild postnasal drip. Mild cough. Mild hoarseness. Eyes:Pupils Equal Round Reactive to light, Extraocular movements intact,  Conjunctiva without redness, discharge or icterus. Neck/lymp/endocrine: Supple, no lymphadenopathy CV: RRR, no edema Chest: CTAB, no wheeze or crackles. Good air movement, normal resp effort.    No exam data present No results found. No results found for this or any previous visit (from the past 24 hour(s)).  Assessment/Plan: Carol Norton is a 71 y.o. female present for OV for  1. Acute recurrent sinusitis, unspecified location Rest, hydrate.  Continue Nasacort, mucinex (DM if cough), nettie pot or nasal saline.  Augmentin prescribed, take until completed.  Low-dose prednisone burst prescribed, same as what her eye doctor approved last episode. Patient is to not use this unless symptoms worsen after starting antibiotics. She is to get this approved by her ophthalmologist. And also try Allegra is able to tolerate. Humidifier use and change filters. If cough present it can last up to 6-8 weeks.  F/U 2 weeks of not improved.     Reviewed expectations re: course of current medical issues.  Discussed self-management of  symptoms.  Outlined signs and symptoms indicating need for more acute intervention.  Patient verbalized understanding and all questions were answered.  Patient received an After-Visit Summary.    No orders of the defined types were placed in this encounter.    Note is dictated utilizing voice recognition software. Although note has been proof read prior to signing, occasional typographical errors still can be missed. If any questions arise, please do not hesitate to call for verification.   electronically signed by:  Howard Pouch, DO  [facility]

## 2017-03-30 ENCOUNTER — Telehealth: Payer: Self-pay | Admitting: Allergy and Immunology

## 2017-03-30 NOTE — Telephone Encounter (Signed)
Called and left message for patient to call office in regards to Dr. Bruna Potter recommendation. I have placed a sample of Dymista up front for her.

## 2017-03-30 NOTE — Telephone Encounter (Signed)
Pt called and said that she can not take accolate or singulair and wanted to know if there was something else she can take [facility] . [phone].

## 2017-03-30 NOTE — Telephone Encounter (Signed)
These have patient try a sample of Dymista 1 spray each nostril 1-2 times a day to replace the Nasacort.  The Dymista has 2 medications contained within it which may work better than just the [place].

## 2017-03-31 NOTE — Telephone Encounter (Signed)
I spoke to [place] and informed her that we have placed a sample up front for her to pick up and she stated that she will be by to pick it up.

## 2017-04-05 NOTE — Telephone Encounter (Signed)
Patient came in to pick up samples reviewed instructions per Dr Neldon Mc

## 2017-04-18 ENCOUNTER — Encounter: Payer: Self-pay | Admitting: Family Medicine

## 2017-04-18 ENCOUNTER — Ambulatory Visit: Payer: Medicare Other | Admitting: Family Medicine

## 2017-04-18 VITALS — BP 136/84 | HR 76 | Temp 98.1°F | Resp 20 | Wt 193.5 lb

## 2017-04-18 DIAGNOSIS — K219 Gastro-esophageal reflux disease without esophagitis: Secondary | ICD-10-CM | POA: Diagnosis not present

## 2017-04-18 DIAGNOSIS — F411 Generalized anxiety disorder: Secondary | ICD-10-CM

## 2017-04-18 DIAGNOSIS — F1310 Sedative, hypnotic or anxiolytic abuse, uncomplicated: Secondary | ICD-10-CM | POA: Diagnosis not present

## 2017-04-18 DIAGNOSIS — M1712 Unilateral primary osteoarthritis, left knee: Secondary | ICD-10-CM

## 2017-04-18 DIAGNOSIS — E039 Hypothyroidism, unspecified: Secondary | ICD-10-CM | POA: Diagnosis not present

## 2017-04-18 MED ORDER — OMEPRAZOLE 40 MG PO CPDR: 40.0000 mg | DELAYED_RELEASE_CAPSULE | Freq: Every day | ORAL | 1 refills | Status: DC

## 2017-04-18 MED ORDER — ALPRAZOLAM 0.5 MG PO TABS: 0.5000 mg | ORAL_TABLET | Freq: Every evening | ORAL | 0 refills | Status: DC | PRN

## 2017-04-18 MED ORDER — MELOXICAM 15 MG PO TABS: 15.0000 mg | ORAL_TABLET | ORAL | 1 refills | Status: DC | PRN

## 2017-04-18 NOTE — Patient Instructions (Signed)
It was great to see you today. I hope you all have a great time in [place]    I have refilled your meds and make sure you find out about the xanax and [place] laws.

## 2017-04-18 NOTE — Progress Notes (Signed)
Carol Norton , March 16, 1946, 71 y.o., female MRN: [id number] Patient Care Team    Relationship Specialty Notifications Start End  [name], DO PCP - General Family Medicine  05/26/16   [name], MD Consulting Physician Orthopedic Surgery  03/11/16   [name], NP Nurse Practitioner Nurse Practitioner  05/26/16   [name], MD Consulting Physician Internal Medicine  05/26/16    Comment: endocrine, thyroid  [name], [name], MD Referring Physician Pulmonary Disease  05/26/16   [name], MD Consulting Physician Neurology  05/26/16   [name], MD Consulting Physician Cardiology  05/26/16   [name], MD Consulting Physician Ophthalmology  05/26/16    Comment: Dr Kathlen Mody at [facility] for glaucoma and cataracts every 3 mos  [name], MD Attending Physician Family Medicine  05/26/16    Comment: [facility]    Chief Complaint  Patient presents with  . Medication Management     Subjective:  Speaking to spend the rest of the winter and spring in [place] and needs to be seen to refill her medications.  Anxiety: Patient takes Xanax 0.5 mg daily at bedtime. She does have a controlled substance contract. [place] told substance database was reviewed today and appropriate. Patient is counseled on the addictive properties of benzodiazepines. She has been tried on other medications with side effects.  Arthritis: Patient is prescribed 15 mg daily. She feels this medication works very well. She does need refills today.  GERD: She is prescribed omeprazole 40 mg daily. She has tried to wean off medication but has been unable.  Depression screen [facility] 2/9 09/23/2016 05/26/2016  Decreased Interest 0 0  Down, Depressed, Hopeless 0 0  PHQ - 2 Score 0 0    Allergies  Allergen Reactions  . Synthroid [Levothyroxine Sodium] Shortness Of Breath  . Accolate [Zafirlukast]   . Avelox [Moxifloxacin Hcl In Nacl]   . Citalopram   . Crestor [Rosuvastatin]   . Dexilant  [Dexlansoprazole]   . Escitalopram Oxalate   . Singulair [Montelukast Sodium]     Aggression,anger  . Statins     Myalgias, joint pain.  . Wellbutrin [Bupropion]   . Simvastatin Palpitations   Social History   Tobacco Use  . Smoking status: Former Smoker    Packs/day: 0.75    Years: 25.00    Pack years: 18.75    Types: Cigarettes    Last attempt to quit: 05/25/2007    Years since quitting: 9.9  . Smokeless tobacco: Never Used  . Tobacco comment: quit for 10 years in the total 64yrs.   Substance Use Topics  . Alcohol use: No    Comment: rare   Past Medical History:  Diagnosis Date  . Allergic rhinitis   . Allergy   . Anxiety   . Bilateral carpal tunnel syndrome    Patient scheduled for surgery with neurology  . Cataract   . Chronic constipation   . Chronic headache   . Chronic sinusitis   . GERD (gastroesophageal reflux disease)   . Glaucoma   . Hyperlipemia   . Hypothyroid   . Leg length discrepancy   . Ocular rosacea   . Plantar fasciitis   . Sebaceous cyst   . Sleep apnea    on CPAP  . Urethral stenosis   . Vitamin D deficiency    Past Surgical History:  Procedure Laterality Date  . ABDOMINAL HYSTERECTOMY  1983  . BREAST SURGERY     sebaceous cyst  .  EYE SURGERY     surgery with laser  . FACIAL COSMETIC SURGERY     1995  . GANGLION CYST EXCISION    . MENISCUS REPAIR Left   . RHINOPLASTY    . SEPTOPLASTY    . TUBAL LIGATION     Family History  Problem Relation Age of Onset  . Emphysema Father   . Alcohol abuse Father   . Allergies Mother   . Heart disease Mother   . Ovarian cancer Mother   . Hyperlipidemia Mother   . Hypothyroidism Mother   . Arthritis Mother   . Hypothyroidism Sister   . Lung cancer Paternal Aunt   . Diabetes Maternal Grandmother   . Heart disease Maternal Grandmother   . Stroke Paternal Grandfather    Allergies as of 04/18/2017      Reactions   Synthroid [levothyroxine Sodium] Shortness Of Breath   Accolate  [zafirlukast]    Avelox [moxifloxacin Hcl In Nacl]    Citalopram    Crestor [rosuvastatin]    Dexilant [dexlansoprazole]    Escitalopram Oxalate    Singulair [montelukast Sodium]    Aggression,anger   Statins    Myalgias, joint pain.   Wellbutrin [bupropion]    Simvastatin Palpitations      Medication List        Accurate as of 04/18/17 10:53 AM. Always use your most recent med list.          ALIGN PO Take by mouth daily.   ALPRAZolam 0.5 MG tablet Commonly known as:  XANAX Take 1 tablet (0.5 mg total) by mouth at bedtime as needed.   ARMOUR THYROID 60 MG tablet Generic drug:  thyroid Take 1 tablet (60 mg total) by mouth daily.   Magnesium 100 MG Caps Take 400 mg by mouth.   meloxicam 15 MG tablet Commonly known as:  MOBIC Take 1 tablet (15 mg total) by mouth as needed for pain.   MIRALAX powder Generic drug:  polyethylene glycol powder Take 1 Container by mouth once.   omeprazole 40 MG capsule Commonly known as:  PRILOSEC Take 1 capsule (40 mg total) by mouth daily. Needs office visit prior to anymore refills   OVER THE COUNTER MEDICATION Retaine eye drops   PERDIEM PO Take 3 tablets by mouth at bedtime.   TUMS PO Take by mouth every morning.   Vitamin D (Ergocalciferol) 50000 units Caps capsule Commonly known as:  DRISDOL Take 1 capsule (50,000 Units total) by mouth every 7 (seven) days.   XIIDRA 5 % Soln Generic drug:  Lifitegrast INSTILL 1 DROP IN BOTH EYES TWICE A DAY       All past medical history, surgical history, allergies, family history, immunizations andmedications were updated in the EMR today and reviewed under the history and medication portions of their EMR.     ROS: Negative, with the exception of above mentioned in HPI   Objective:  BP 136/84 (BP Location: Right Arm, Patient Position: Sitting, Cuff Size: Large)   Pulse 76   Temp 98.1 F (36.7 C)   Resp 20   Wt 193 lb 8 oz (87.8 kg)   SpO2 95%   BMI 31.61 kg/m  Body  mass index is 31.61 kg/m. Gen: Afebrile. No acute distress. Nontoxic in appearance, well-developed, well-nourished, obese Caucasian female. HENT: AT. [place].  MMM.  Eyes:Pupils Equal Round Reactive to light, Extraocular movements intact,  Conjunctiva without redness, discharge or icterus. CV: RRR no murmur, no edema, +2/4 P posterior tibialis pulses Chest:  CTAB, no wheeze or crackles Abd: Soft. NTND. BS present. No Masses palpated.  Skin: no rashes, purpura or petechiae.  Neuro:  Normal gait. PERLA. EOMi. Alert. Oriented x3  Psych: Normal affect, dress and demeanor. Normal speech. Normal thought content and judgment.   No exam data present No results found. No results found for this or any previous visit (from the past 24 hour(s)).  Assessment/Plan: Carol Norton is a 71 y.o. female present for OV for  Acquired hypothyroidism Continue following with endocrinology  Primary osteoarthritis of left knee Refills provided today. - meloxicam (MOBIC) 15 MG tablet; Take 1 tablet (15 mg total) by mouth as needed for pain.  Dispense: 90 tablet; Refill: 1  Mild benzodiazepine use disorder (HCC) Generalized anxiety disorder - Anxiety and insomnia controlled with very low-dose Xanax daily at bedtime. Patient has been counseled on addictive properties of benzodiazepines as well as side effect profile in geriatric population. - Controlled substance contract has been signed. [place] controlled substance database has been reviewed, appropriate made part of her permanent chart. Patient is aware that she will need to be seen every 6 months face-to-face with this provider for refills on this medication.  - [facility] was advised [facility] if [facility] will be able [facility] or if she needs to carry her prescription with her to [place], and if [place] will taken out of state prescription. - Prescription was handed to patient  today.  Gastroesophageal reflux disease, esophagitis presence not specified Refills provided on omeprazole 40 mg daily.  Follow-up in 6 months to get back from [place].   Reviewed expectations re: course of current medical issues.  Discussed self-management of symptoms.  Outlined signs and symptoms indicating need for more acute intervention.  Patient verbalized understanding and all questions were answered.  Patient received an After-Visit Summary.    No orders of the defined types were placed in this encounter.    Note is dictated utilizing voice recognition software. Although note has been proof read prior to signing, occasional typographical errors still can be missed. If any questions arise, please do not hesitate to call for verification.   electronically signed by:  Howard Pouch, DO  [place]

## 2017-07-19 ENCOUNTER — Telehealth: Payer: Self-pay | Admitting: Family Medicine

## 2017-07-19 NOTE — Telephone Encounter (Signed)
Copied from [place]. Topic: Quick Communication - See Telephone Encounter >> Jul 19, 2017  3:21 PM Vernona Rieger wrote: CRM for notification. See Telephone encounter for:   07/19/17.  Pt said that Dr Raoul Pitch gave her a hand written prescription for ALPRAZolam Duanne Moron) 0.5 MG tablet to [facility] [place] they told her they can not accept it bc it was not on tamper proof prescription paper. It has to be on a certain type of paper. They did tell her they will accept a telephone call for this to be refilled. Please call @ [phone].  Pt's [phone]

## 2017-07-20 NOTE — Telephone Encounter (Signed)
[facility] [place] [facility] because in Dr Lucita Lora last [place] note it states patient was given paper RX.   Patient was told that RX is being worked on and she must present paper prescription to pharmacy for get her refill.  Patient voiced understanding.

## 2017-07-20 NOTE — Telephone Encounter (Signed)
Pt calling back because the pharmacy has not heard anything back. Pt states the pharmacy will accept a call from the nurse or doctor. Pt states she is out today,  Needs someone to call asap.  Rx was due to be filled on 07/15/17, but pt waited because she had enough.  [phone]

## 2017-08-03 IMAGING — DX DG CHEST 2V
2 series · 2 of 2 positions shown · non-contrast
Comparison: 11/19/2014

CLINICAL DATA: Cough.  Rhinosinusitis.

EXAM:
CHEST  2 VIEW

[chest pa]
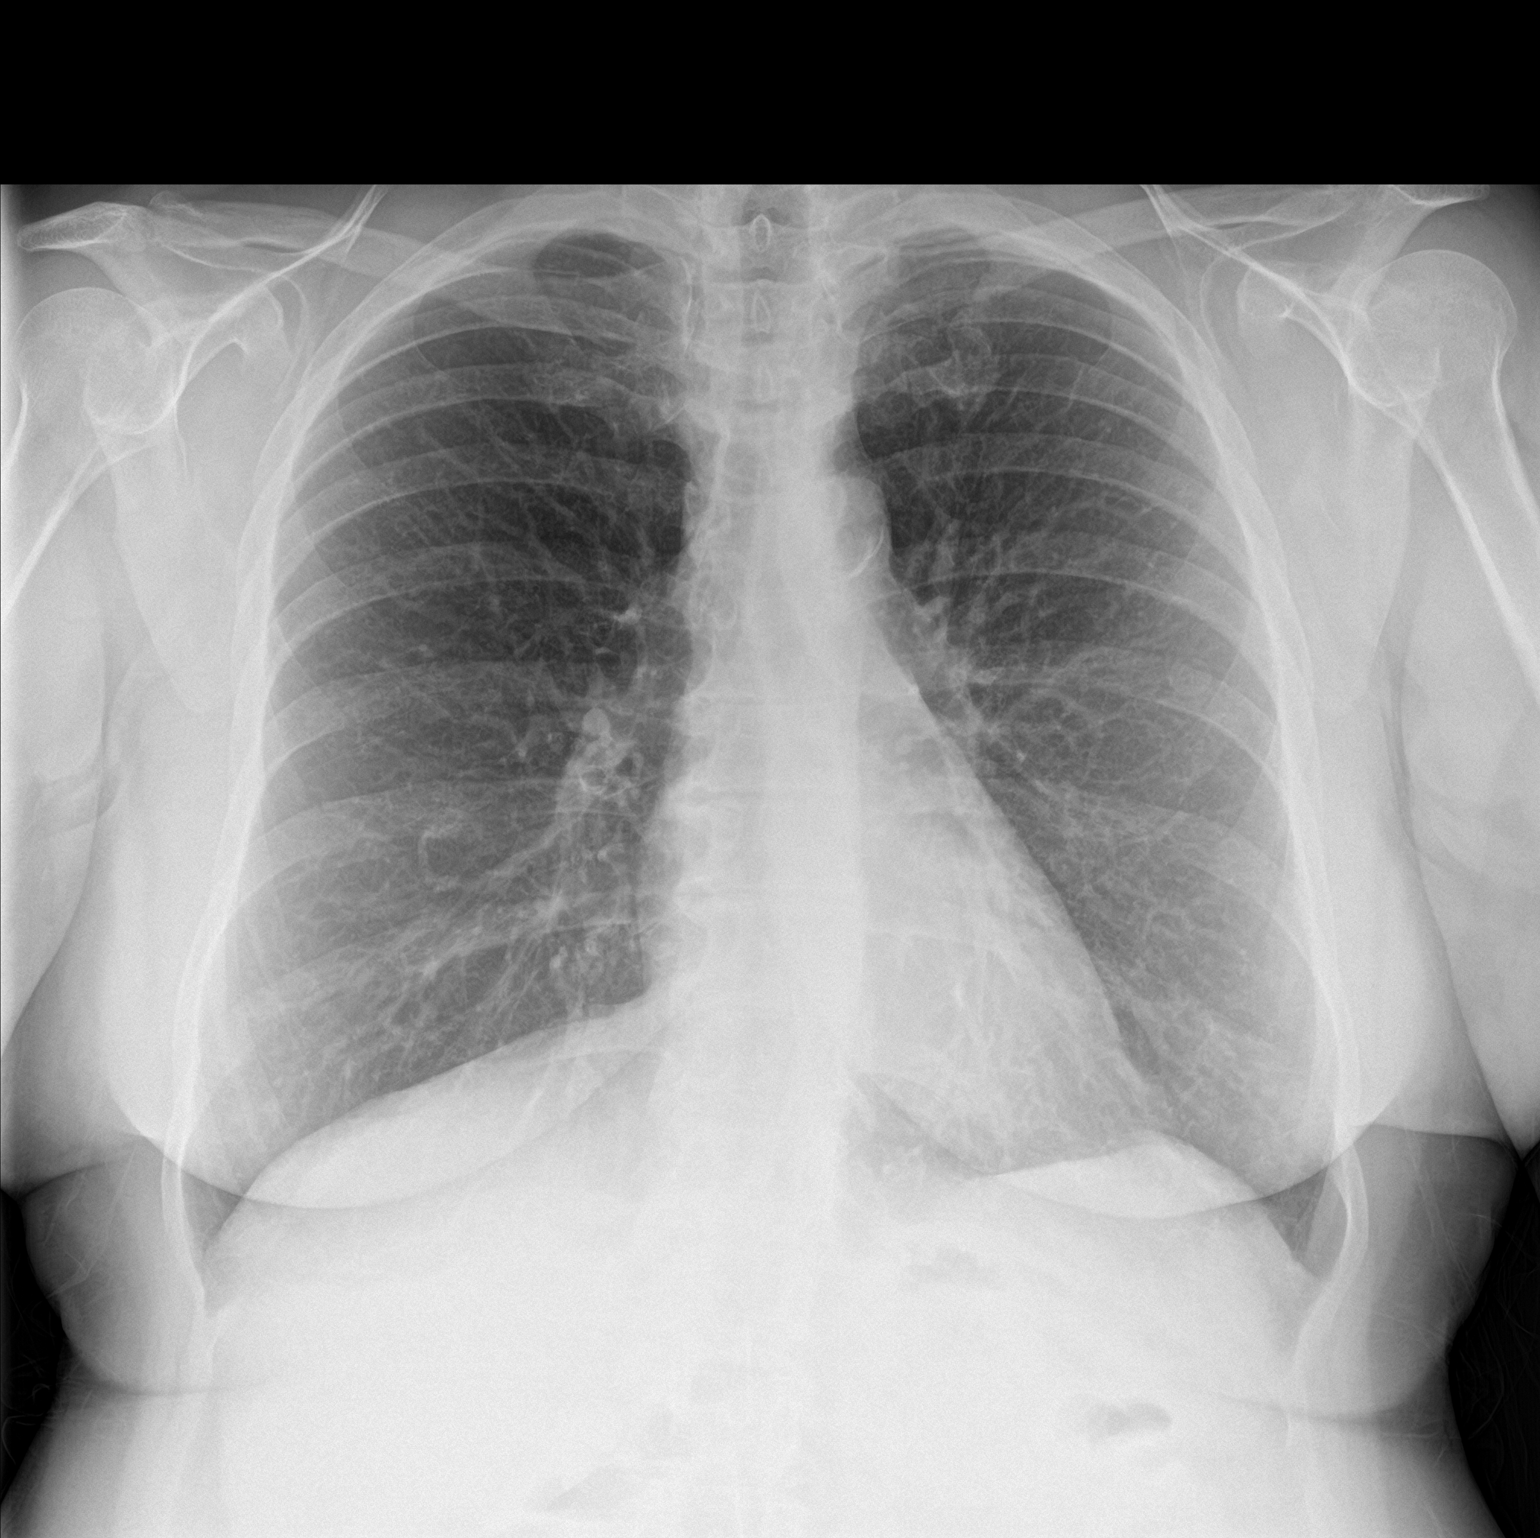

[chest lat]
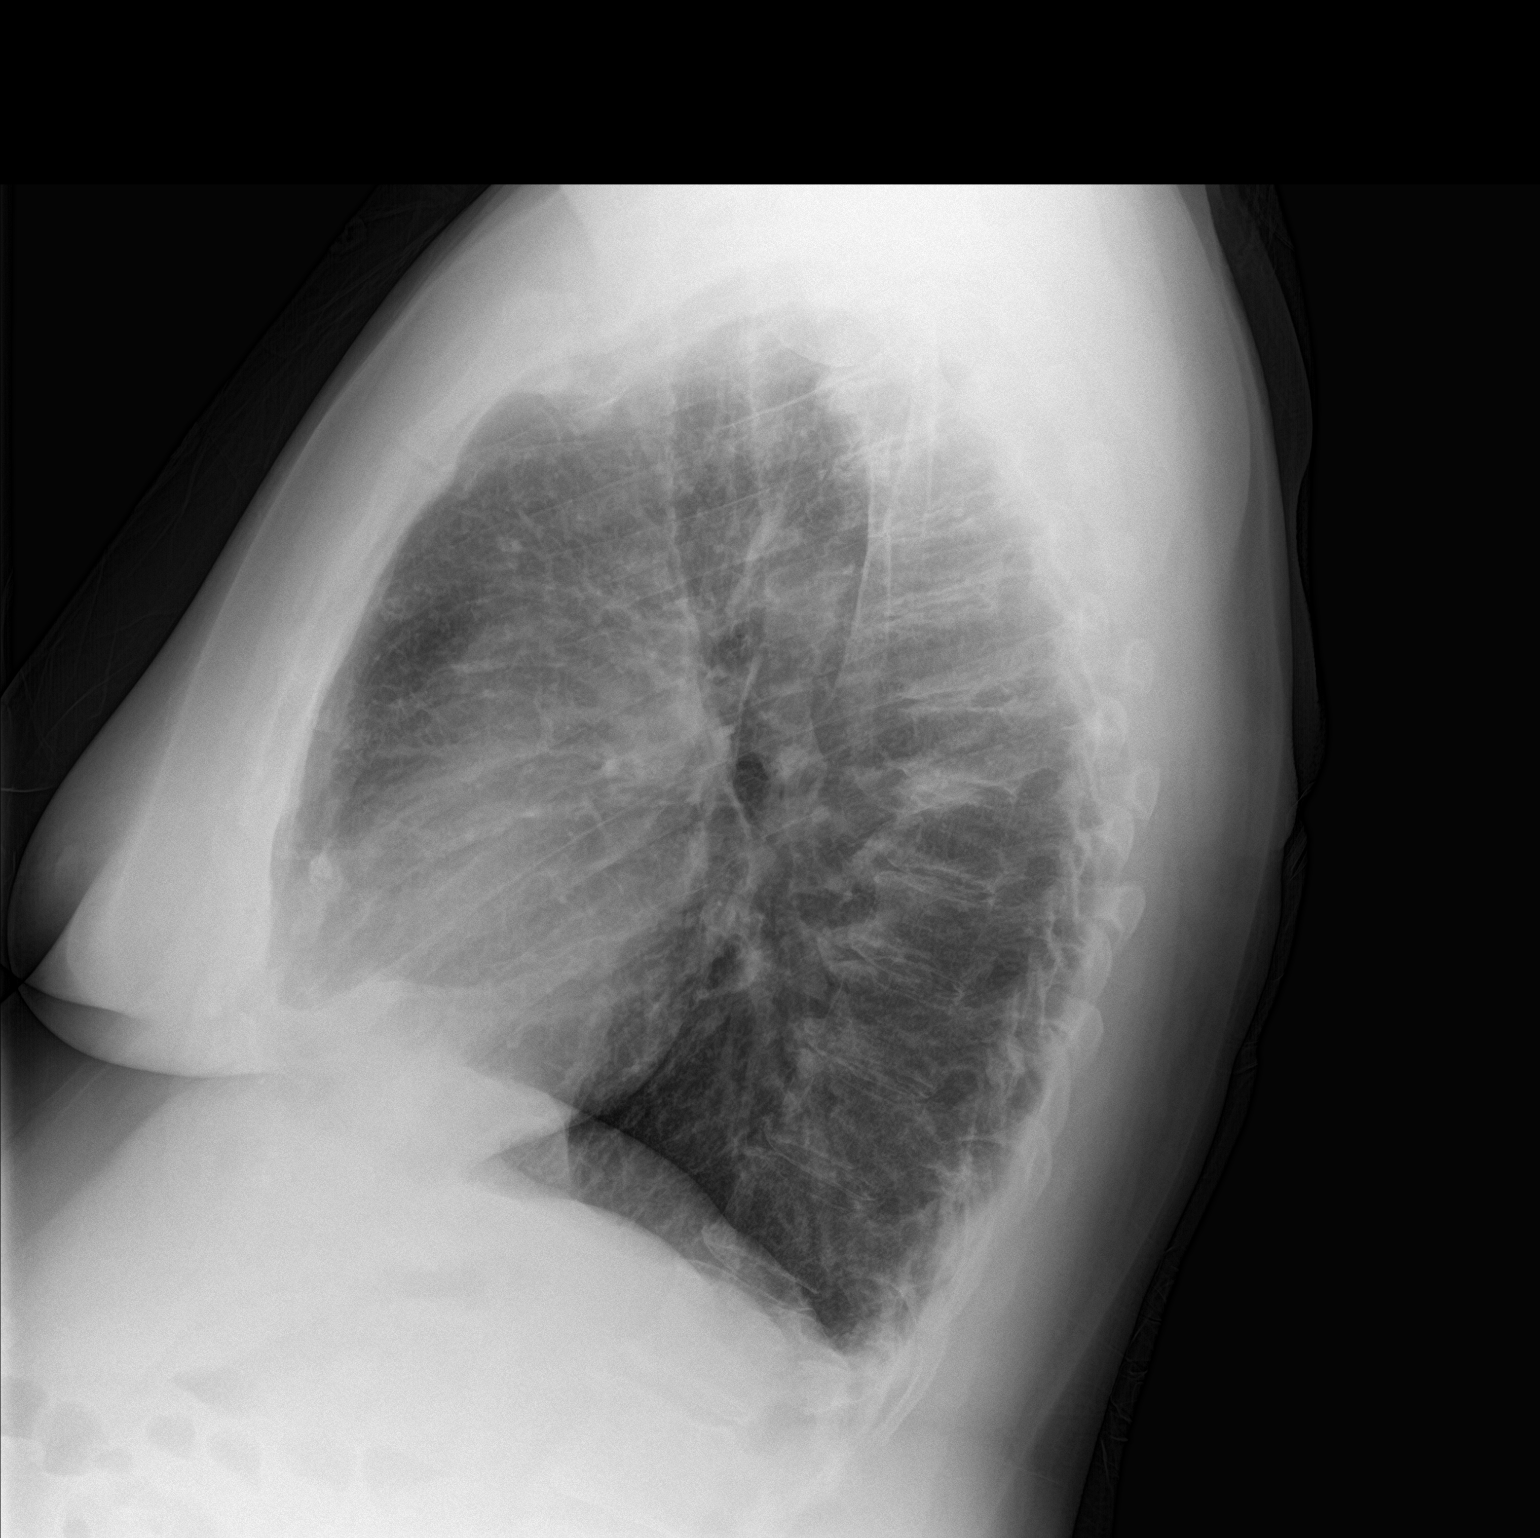

[2 of 2 positions shown; findings below may reference images not displayed]

FINDINGS: The heart size and mediastinal contours are within normal limits.
Both lungs are clear. The visualized skeletal structures are
unremarkable.
IMPRESSION: No active cardiopulmonary disease.

## 2017-09-26 DIAGNOSIS — M1712 Unilateral primary osteoarthritis, left knee: Secondary | ICD-10-CM | POA: Insufficient documentation

## 2017-09-28 DIAGNOSIS — M79641 Pain in right hand: Secondary | ICD-10-CM | POA: Insufficient documentation

## 2017-10-03 ENCOUNTER — Telehealth: Payer: Self-pay | Admitting: Internal Medicine

## 2017-10-03 DIAGNOSIS — E039 Hypothyroidism, unspecified: Secondary | ICD-10-CM

## 2017-10-03 NOTE — Telephone Encounter (Signed)
Patient would like to get her thyroid checked with labs. Patient takes Armour because she had side effects from Synthroid (Levothyroxine). Patient wants to know how she can get insurance company to cover the Armour since that is the only medication she can take. Please contact patient when labs been  ordered.

## 2017-10-05 ENCOUNTER — Encounter: Payer: Self-pay | Admitting: Family Medicine

## 2017-10-06 LAB — HM DIABETES EYE EXAM

## 2017-10-06 NOTE — Telephone Encounter (Signed)
Stacy, can you please order TSH, free T4, free T3?  Also, let us start the preauthorization for Armour.

## 2017-10-07 ENCOUNTER — Encounter: Payer: Self-pay | Admitting: *Deleted

## 2017-10-07 NOTE — Telephone Encounter (Signed)
Lab orders placed. Armour PA initiated via CoverMyMeds.   Key: [id number]  Left detailed mess informing pt.

## 2017-10-11 ENCOUNTER — Ambulatory Visit: Payer: Self-pay | Admitting: *Deleted

## 2017-10-11 ENCOUNTER — Telehealth: Payer: Self-pay

## 2017-10-11 ENCOUNTER — Emergency Department (HOSPITAL_BASED_OUTPATIENT_CLINIC_OR_DEPARTMENT_OTHER): Payer: Medicare Other

## 2017-10-11 ENCOUNTER — Encounter (HOSPITAL_BASED_OUTPATIENT_CLINIC_OR_DEPARTMENT_OTHER): Payer: Self-pay | Admitting: *Deleted

## 2017-10-11 ENCOUNTER — Ambulatory Visit: Payer: Medicare Other | Admitting: Family Medicine

## 2017-10-11 ENCOUNTER — Other Ambulatory Visit: Payer: Self-pay

## 2017-10-11 ENCOUNTER — Emergency Department (HOSPITAL_BASED_OUTPATIENT_CLINIC_OR_DEPARTMENT_OTHER)
Admission: EM | Admit: 2017-10-11 | Discharge: 2017-10-11 | Disposition: A | Discharge status: Home / Self Care | Payer: Medicare Other | Attending: Emergency Medicine | Admitting: Emergency Medicine

## 2017-10-11 DIAGNOSIS — Y939 Activity, unspecified: Secondary | ICD-10-CM | POA: Diagnosis not present

## 2017-10-11 DIAGNOSIS — Y999 Unspecified external cause status: Secondary | ICD-10-CM | POA: Diagnosis not present

## 2017-10-11 DIAGNOSIS — E039 Hypothyroidism, unspecified: Secondary | ICD-10-CM | POA: Diagnosis not present

## 2017-10-11 DIAGNOSIS — Y929 Unspecified place or not applicable: Secondary | ICD-10-CM | POA: Diagnosis not present

## 2017-10-11 DIAGNOSIS — S0990XA Unspecified injury of head, initial encounter: Secondary | ICD-10-CM | POA: Diagnosis not present

## 2017-10-11 DIAGNOSIS — Z79899 Other long term (current) drug therapy: Secondary | ICD-10-CM | POA: Diagnosis not present

## 2017-10-11 DIAGNOSIS — Z87891 Personal history of nicotine dependence: Secondary | ICD-10-CM | POA: Diagnosis not present

## 2017-10-11 DIAGNOSIS — S0081XA Abrasion of other part of head, initial encounter: Secondary | ICD-10-CM | POA: Insufficient documentation

## 2017-10-11 DIAGNOSIS — W1789XA Other fall from one level to another, initial encounter: Secondary | ICD-10-CM | POA: Diagnosis not present

## 2017-10-11 DIAGNOSIS — T148XXA Other injury of unspecified body region, initial encounter: Secondary | ICD-10-CM

## 2017-10-11 DIAGNOSIS — W19XXXA Unspecified fall, initial encounter: Secondary | ICD-10-CM

## 2017-10-11 NOTE — ED Triage Notes (Signed)
She slipped and fell this am. Hematoma to her forehead. No LOC.

## 2017-10-11 NOTE — Discharge Instructions (Signed)
°  Head Injury You have been seen today for a head injury. It does not appear to be serious at this time.  Close observation: The close observation period is usually 6 hours from the injury. This includes staying awake and having a trustworthy adult monitor you to assure your condition does not worsen. You should be in regular contact with this person and ideally, they should be able to monitor you in person.  Secondary observation: The secondary observation period is usually 24 hours from the injury. You are allowed to sleep during this time. A trustworthy adult should intermittently monitor you to assure your condition does not worsen.   Overall head injury/concussion care: Rest: Be sure to get plenty of rest. You will need more rest and sleep while you recover. Hydration: Be sure to stay well hydrated by having a goal of drinking about 0.5 liters of water an hour. Pain:  Antiinflammatory medications: Take 600 mg of ibuprofen every 6 hours or 440 mg (over the counter dose) to 500 mg (prescription dose) of naproxen every 12 hours or for the next 3 days. After this time, these medications may be used as needed for pain. Take these medications with food to avoid upset stomach. Choose only one of these medications, do not take them together. Tylenol: Should you continue to have additional pain while taking the ibuprofen or naproxen, you may add in tylenol as needed. Your daily total maximum amount of tylenol from all sources should be limited to 4000mg /day for persons without liver problems, or 2000mg /day for those with liver problems. Return to sports and activities: In general, you may return to normal activities once symptoms have subsided, however, you would ideally be cleared by a primary care provider or other qualified medical professional prior to return to these activities.  Follow up: Follow up with the concussion clinic or your primary care provider for further management of this issue. Return:  Return to the ED should you begin to have confusion, abnormal behavior, aggression, violence, or personality changes, repeated vomiting, vision loss, numbness or weakness on one side of the body, difficulty standing due to dizziness, significantly worsening pain, or any other major concerns.  There were no acute abnormalities noted on the CT scans.  There were abnormalities noted on the CT scans that are not acute, but would be valuable to know about.  A printout of these results has been provided for you.  These results should be discussed with your primary care provider.

## 2017-10-11 NOTE — ED Provider Notes (Addendum)
[facility] EMERGENCY DEPARTMENT Provider Note   CSN: [id number] Arrival date & time: 10/11/17  1249     History   Chief Complaint Chief Complaint  Patient presents with  . Fall    HPI Martinique Pizzimenti is a 72 y.o. female.  HPI   Jessicah Croll is a 72 y.o. female, with a history of GERD, presenting to the ED with a mechanical fall that occurred around 11 AM this morning.  States she slipped on the deck, which is about 3 steps high, fell into some bushes, and hit the front of her head on the ground.  States she called her PCPs office to make an appointment "to get checked out," but she was told she needed to come to the ED due to her age.  She denies anticoagulation.  Denies LOC, nausea/vomiting, neck/back pain, neuro deficits, dizziness, headache, chest pain, shortness of breath, abdominal pain, or any other complaints.      Past Medical History:  Diagnosis Date  . Allergic rhinitis   . Allergy   . Anxiety   . Bilateral carpal tunnel syndrome    Patient scheduled for surgery with neurology  . Cataract   . Chronic constipation   . Chronic headache   . Chronic sinusitis   . GERD (gastroesophageal reflux disease)   . Glaucoma   . Hyperlipemia   . Hypothyroid   . Leg length discrepancy   . Ocular rosacea   . Plantar fasciitis   . Sebaceous cyst   . Sleep apnea    on CPAP  . Urethral stenosis   . Vitamin D deficiency     Patient Active Problem List   Diagnosis Date Noted  . Gastroesophageal reflux disease 01/11/2017  . 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 Generalized anxiety disorder 05/13/2016  . Obesity (BMI 30.0-34.9) 05/13/2016  . Mixed hyperlipidemia 05/13/2016  . 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 Vitamin D deficiency 02/25/2015  . Foraminal stenosis of cervical region 02/20/2015  . Acquired hypothyroidism 12/17/2014  . Glaucoma 12/17/2014  . CN (constipation) 12/17/2014  . Ocular rosacea 12/17/2014  . OSA (obstructive sleep apnea)  10/06/2012    Past Surgical History:  Procedure Laterality Date  . ABDOMINAL HYSTERECTOMY  1983  . BREAST SURGERY     sebaceous cyst  . EYE SURGERY     surgery with laser  . FACIAL COSMETIC SURGERY     1995  . GANGLION CYST EXCISION    . MENISCUS REPAIR Left   . RHINOPLASTY    . SEPTOPLASTY    . TUBAL LIGATION       OB History    Gravida  2   Para      Term      Preterm      AB      Living  2     SAB      TAB      Ectopic      Multiple      Live Births               Home Medications    Prior to Admission medications   Medication Sig Start Date End Date Taking? Authorizing Provider  ALPRAZolam Duanne Moron) 0.5 MG tablet Take 1 tablet (0.5 mg total) by mouth at bedtime as needed. 04/18/17  Yes Kuneff, Renee A, DO  ARMOUR THYROID 60 MG tablet Take 1 tablet (60 mg total) by mouth daily. 11/30/16  Yes [name], MD  Calcium Carbonate Antacid (TUMS PO)  Take by mouth every morning.    [provider]  doxycycline (MONODOX) 100 MG capsule Take 100 mg by mouth daily.    [provider]  Magnesium 100 MG CAPS Take 400 mg by mouth.     [provider]  meloxicam (MOBIC) 15 MG tablet Take 1 tablet (15 mg total) by mouth as needed for pain. 04/18/17   Kuneff, Renee A, DO  omeprazole (PRILOSEC) 40 MG capsule Take 1 capsule (40 mg total) by mouth daily. Needs office visit prior to anymore refills 04/18/17   Kuneff, Renee A, DO  OVER THE COUNTER MEDICATION Retaine eye drops    [provider]  polyethylene glycol powder (MIRALAX) powder Take 1 Container by mouth once.    [provider]  Probiotic Product (ALIGN PO) Take by mouth daily.    [provider]  Senna-Psyllium (PERDIEM PO) Take 3 tablets by mouth at bedtime.    [provider]  Vitamin D, Ergocalciferol, (DRISDOL) 50000 units CAPS capsule Take 1 capsule (50,000 Units total) by mouth every 7 (seven) days. 05/26/16   Kuneff, Renee A, DO  XIIDRA 5  % SOLN INSTILL 1 DROP IN BOTH EYES TWICE A DAY 11/30/16   [provider]    Family History Family History  Problem Relation Age of Onset  . Emphysema Father   . Alcohol abuse Father   . Allergies Mother   . Heart disease Mother   . Ovarian cancer Mother   . Hyperlipidemia Mother   . Hypothyroidism Mother   . Arthritis Mother   . Hypothyroidism Sister   . Lung cancer Paternal Aunt   . Diabetes Maternal Grandmother   . Heart disease Maternal Grandmother   . Stroke Paternal Grandfather     Social History Social History   Tobacco Use  . Smoking status: Former Smoker    Packs/day: 0.75    Years: 25.00    Pack years: 18.75    Types: Cigarettes    Last attempt to quit: 05/25/2007    Years since quitting: 10.3  . Smokeless tobacco: Never Used  . Tobacco comment: quit for 10 years in the total 70yrs.   Substance Use Topics  . Alcohol use: No    Comment: rare  . Drug use: No     Allergies   Synthroid [levothyroxine sodium]; Accolate [zafirlukast]; Avelox [moxifloxacin hcl in nacl]; Citalopram; Crestor [rosuvastatin]; Dexilant [dexlansoprazole]; Escitalopram oxalate; Singulair [montelukast sodium]; Statins; Wellbutrin [bupropion]; Dymista [azelastine-fluticasone]; and Simvastatin   Review of Systems Review of Systems  Respiratory: Negative for shortness of breath.   Cardiovascular: Negative for chest pain.  Gastrointestinal: Negative for abdominal pain, nausea and vomiting.  Musculoskeletal: Negative for back pain and neck pain.  Skin: Positive for wound.  Neurological: Negative for dizziness, weakness, light-headedness, numbness and headaches.  All other systems reviewed and are negative.    Physical Exam Updated Vital Signs BP (!) 150/77   Pulse 80   Temp 98.1 F (36.7 C) (Oral)   Resp 12   Ht 5\' 7"  (1.702 m)   Wt 87.5 kg (193 lb)   BMI 30.23 kg/m   Physical Exam  Constitutional: She is oriented to person, place, and time. She appears well-developed  and well-nourished. No distress.  HENT:  Head: Normocephalic.  Mild appearing abrasions to the forehead.  No noted swelling, deformity, or instability.  Eyes: Pupils are equal, round, and reactive to light. Conjunctivae and EOM are normal.  Neck: Normal range of motion. Neck supple.  Cardiovascular:  Normal rate, regular rhythm, normal heart sounds and intact distal pulses.  Pulmonary/Chest: Effort normal and breath sounds normal. No respiratory distress.  Noted evidence of injury to the chest.  Abdominal: Soft. There is no tenderness. There is no guarding.  No noted evidence of injury to the abdomen.  Musculoskeletal: She exhibits no edema.  Normal motor function intact in all extremities and spine. No midline spinal tenderness.   Pelvis appears to be stable and nontender.  Lymphadenopathy:    She has no cervical adenopathy.  Neurological: She is alert and oriented to person, place, and time. GCS eye subscore is 4. GCS verbal subscore is 5. GCS motor subscore is 6.  No sensory deficits.  No noted speech deficits. No aphasia. Patient handles oral secretions without difficulty. No noted swallowing defects.  Equal grip strength bilaterally. Strength 5/5 in the upper extremities. Strength 5/5 with flexion and extension of the hips, knees, and ankles bilaterally.  Negative Romberg. No gait disturbance.  Coordination intact including heel to shin and finger to nose.  Cranial nerves III-XII grossly intact.  No facial droop.   Skin: Skin is warm and dry. She is not diaphoretic.  Psychiatric: She has a normal mood and affect. Her behavior is normal.  Nursing note and vitals reviewed.        ED Treatments / Results  Labs (all labs ordered are listed, but only abnormal results are displayed) Labs Reviewed - No data to display  EKG None  Radiology Ct Head Wo Contrast  Result Date: 10/11/2017 CLINICAL DATA:  Pain following fall EXAM: CT HEAD WITHOUT CONTRAST CT CERVICAL SPINE  WITHOUT CONTRAST TECHNIQUE: Multidetector CT imaging of the head and cervical spine was performed following the standard protocol without intravenous contrast. Multiplanar CT image reconstructions of the cervical spine were also generated. COMPARISON:  Cervical MRI December 14, 2013 FINDINGS: CT HEAD FINDINGS Brain: The ventricles are normal in size and configuration. There is no intracranial mass, hemorrhage, extra-axial fluid collection, or midline shift. There is slight small vessel disease in the centra semiovale bilaterally. Elsewhere gray-white compartments appear normal. No evident acute infarct. Vascular: No hyperdense vessels. There is calcification in each carotid siphon region. Skull: The bony calvarium appears intact. Sinuses/Orbits: There is mucosal thickening in multiple ethmoid air cells bilaterally with opacification in several ethmoid air cells. There is also mucosal thickening in the right maxillary antrum. Other visualized paranasal sinuses are clear. Orbits appear symmetric bilaterally. Other: There is opacification in several mastoid air cells bilaterally. CT CERVICAL SPINE FINDINGS Alignment: There is 2 mm of retrolisthesis of C4 on C5. There is 1 mm of retrolisthesis of C5 on C6. No other spondylolisthesis. Skull base and vertebrae: Skull base and craniocervical junction regions appear normal. No fracture evident. No blastic or lytic bone lesions. Soft tissues and spinal canal: Prevertebral soft tissues and predental space regions are normal. There are no paraspinous lesions. There is no cord or canal hematoma evident. Disc levels: There is moderately severe disc space narrowing at C4-5, C5-6, C6-7, and C7-T1. There is facet osteoarthritic change at multiple levels. There is exit foraminal narrowing due to bony hypertrophy at C4-5 on the right, at C5-6 bilaterally, and at C6-7 on the left. There is no frank disc extrusion or high-grade stenosis. Upper chest: Visualized upper lung regions are  clear. Other: There is calcification in the left carotid and subclavian arteries. IMPRESSION: CT head: No mass or hemorrhage. There is slight periventricular small vessel disease. No acute infarct. There are foci of  arterial vascular calcification. There is mild paranasal sinus disease as well as bilateral opacification in several mastoid air cells. CT cervical spine: No demonstrable fracture. Mild spondylolisthesis at C4-5 and C5-6 is felt to be due to underlying spondylosis. There is multilevel osteoarthritic change. There are foci of vascular calcification in left carotid and left subclavian arteries. Electronically Signed   By: Lowella Grip III M.D.   On: 10/11/2017 15:34   Ct Cervical Spine Wo Contrast  Result Date: 10/11/2017 CLINICAL DATA:  Pain following fall EXAM: CT HEAD WITHOUT CONTRAST CT CERVICAL SPINE WITHOUT CONTRAST TECHNIQUE: Multidetector CT imaging of the head and cervical spine was performed following the standard protocol without intravenous contrast. Multiplanar CT image reconstructions of the cervical spine were also generated. COMPARISON:  Cervical MRI December 14, 2013 FINDINGS: CT HEAD FINDINGS Brain: The ventricles are normal in size and configuration. There is no intracranial mass, hemorrhage, extra-axial fluid collection, or midline shift. There is slight small vessel disease in the centra semiovale bilaterally. Elsewhere gray-white compartments appear normal. No evident acute infarct. Vascular: No hyperdense vessels. There is calcification in each carotid siphon region. Skull: The bony calvarium appears intact. Sinuses/Orbits: There is mucosal thickening in multiple ethmoid air cells bilaterally with opacification in several ethmoid air cells. There is also mucosal thickening in the right maxillary antrum. Other visualized paranasal sinuses are clear. Orbits appear symmetric bilaterally. Other: There is opacification in several mastoid air cells bilaterally. CT CERVICAL SPINE  FINDINGS Alignment: There is 2 mm of retrolisthesis of C4 on C5. There is 1 mm of retrolisthesis of C5 on C6. No other spondylolisthesis. Skull base and vertebrae: Skull base and craniocervical junction regions appear normal. No fracture evident. No blastic or lytic bone lesions. Soft tissues and spinal canal: Prevertebral soft tissues and predental space regions are normal. There are no paraspinous lesions. There is no cord or canal hematoma evident. Disc levels: There is moderately severe disc space narrowing at C4-5, C5-6, C6-7, and C7-T1. There is facet osteoarthritic change at multiple levels. There is exit foraminal narrowing due to bony hypertrophy at C4-5 on the right, at C5-6 bilaterally, and at C6-7 on the left. There is no frank disc extrusion or high-grade stenosis. Upper chest: Visualized upper lung regions are clear. Other: There is calcification in the left carotid and subclavian arteries. IMPRESSION: CT head: No mass or hemorrhage. There is slight periventricular small vessel disease. No acute infarct. There are foci of arterial vascular calcification. There is mild paranasal sinus disease as well as bilateral opacification in several mastoid air cells. CT cervical spine: No demonstrable fracture. Mild spondylolisthesis at C4-5 and C5-6 is felt to be due to underlying spondylosis. There is multilevel osteoarthritic change. There are foci of vascular calcification in left carotid and left subclavian arteries. Electronically Signed   By: Lowella Grip III M.D.   On: 10/11/2017 15:34    Procedures Procedures (including critical care time)  Medications Ordered in ED Medications - No data to display   Initial Impression / Assessment and Plan / ED Course  I have reviewed the triage vital signs and the nursing notes.  Pertinent labs & imaging results that were available during my care of the patient were reviewed by me and considered in my medical decision making (see chart for  details).  Clinical Course as of Oct 11 1821  Tue May 21, 203  [id number] 72 year old female tripped and fell off the stairs into a bush striking her head.  No LOC.  She is  been awake alert no vomiting.  She has some soft tissue swelling over her forehead.  Getting a head CT and likely she will be discharged with symptomatic treatment.   [MB]    Clinical Course User Index [MB] Hayden Rasmussen, MD    Patient presents for evaluation following a fall and head injury.  No features consistent with serious head injury.  CT without acute abnormalities, though chronic abnormalities were noted in the vasculature.  These were specifically discussed with the patient, a printout of the findings were handed to her, and she was advised to review these with her PCP. The patient was given instructions for home care as well as return precautions. Patient voices understanding of these instructions, accepts the plan, and is comfortable with discharge.    Findings and plan of care discussed with Aletta Edouard, MD. Dr. Melina Copa personally evaluated and examined this patient.  Final Clinical Impressions(s) / ED Diagnoses   Final diagnoses:  Fall, initial encounter  Injury of head, initial encounter  Abrasion    ED Discharge Orders    None       Layla Maw 10/11/17 1825    Lorayne Bender, PA-C 10/11/17 1825    Hayden Rasmussen, MD 10/12/17 1009

## 2017-10-11 NOTE — ED Notes (Signed)
Patient transported to CT 

## 2017-10-11 NOTE — Telephone Encounter (Signed)
Spoke with patient regarding fall. Patient states she was carrying plants and tripped, hitting head on planter. Patient reports knot on forehead with small abrasion. Denies LOC or visual disturbances. Patient concerned about upcoming carpel tunnel surgery scheduled for 10/14/17. Advised patient to go to Emergency Department for evaluation. Patient verbalized understanding, plans to go to [employer].

## 2017-10-11 NOTE — Telephone Encounter (Signed)
Returned pt's call.   She informed me she just got off of the phone with someone from the office.   They are going to call her back after talking with Dr. Raoul Pitch. I verified with the [facility] office that she had in fact talked with someone there and wasn't mistaking them with one of the agents from the [facility].   They did talk with the pt.  I closed my encounter.

## 2017-10-12 ENCOUNTER — Ambulatory Visit: Payer: Medicare Other | Admitting: Family Medicine

## 2017-10-12 ENCOUNTER — Encounter: Payer: Self-pay | Admitting: Family Medicine

## 2017-10-12 VITALS — BP 138/85 | HR 75 | Temp 97.8°F | Resp 20 | Ht 67.0 in | Wt 202.5 lb

## 2017-10-12 DIAGNOSIS — S0990XA Unspecified injury of head, initial encounter: Secondary | ICD-10-CM | POA: Diagnosis not present

## 2017-10-12 DIAGNOSIS — J329 Chronic sinusitis, unspecified: Secondary | ICD-10-CM

## 2017-10-12 DIAGNOSIS — I6522 Occlusion and stenosis of left carotid artery: Secondary | ICD-10-CM | POA: Diagnosis not present

## 2017-10-12 DIAGNOSIS — W19XXXA Unspecified fall, initial encounter: Secondary | ICD-10-CM

## 2017-10-12 DIAGNOSIS — H7493 Unspecified disorder of middle ear and mastoid, bilateral: Secondary | ICD-10-CM | POA: Diagnosis not present

## 2017-10-12 NOTE — Patient Instructions (Signed)
I am glad you are doing alright after your fall.  Take it easy for the next couple days until your surgery.     Please help [place] help you:  We are honored you have chosen [facility] for your Primary Care home. Below you will find basic instructions that you may need to access in the future. Please help [place] help you by reading the instructions, which cover many of the frequent questions we experience.   Prescription refills and request:  -In order to allow more efficient response time, please call your pharmacy for all refills. They will forward the request electronically to [place]. This allows for the quickest possible response. Request left on a nurse line can take longer to refill, since these are checked as time allows between office patients and other phone calls.  - refill request can take up to 3-5 working days to complete.  - If request is sent electronically and request is appropiate, it is usually completed in 1-2 business days.  - all patients will need to be seen routinely for all chronic medical conditions requiring prescription medications (see follow-up below). If you are overdue for follow up on your condition, you will be asked to make an appointment and we will call in enough medication to cover you until your appointment (up to 30 days).  - all controlled substances will require a face to face visit to request/refill.  - if you desire your prescriptions to go through a new pharmacy, and have an active script at original pharmacy, you will need to call your pharmacy and have scripts transferred to new pharmacy. This is completed between the pharmacy locations and not by your provider.    Results: If any images or labs were ordered, it can take up to 1 week to get results depending on the test ordered and the lab/facility running and resulting the test. - Normal or stable results, which do not need further discussion, may be released to your mychart immediately with attached note to  you. A call may not be generated for normal results. Please make certain to sign up for mychart. If you have questions on how to activate your mychart you can call the front office.  - If your results need further discussion, our office will attempt to contact you via phone, and if unable to reach you after 2 attempts, we will release your abnormal result to your mychart with instructions.  - All results will be automatically released in mychart after 1 week.  - Your provider will provide you with explanation and instruction on all relevant material in your results. Please keep in mind, results and labs may appear confusing or abnormal to the untrained eye, but it does not mean they are actually abnormal for you personally. If you have any questions about your results that are not covered, or you desire more detailed explanation than what was provided, you should make an appointment with your provider to do so.   Our office handles many outgoing and incoming calls daily. If we have not contacted you within 1 week about your results, please check your mychart to see if there is a message first and if not, then contact our office.  In helping with this matter, you help decrease call volume, and therefore allow [place] to be able to respond to patients needs more efficiently.   Acute office visits (sick visit):  An acute visit is intended for a new problem and are scheduled in shorter time  slots to allow schedule openings for patients with new problems. This is the appropriate visit to discuss a new problem. Problems will not be addressed by phone call or Echart message. Appointment is needed if requesting treatment. In order to provide you with excellent quality medical care with proper time for you to explain your problem, have an exam and receive treatment with instructions, these appointments should be limited to one new problem per visit. If you experience a new problem, in which you desire to be addressed,  please make an acute office visit, we save openings on the schedule to accommodate you. Please do not save your new problem for any other type of visit, let us take care of it properly and quickly for you.   Follow up visits:  Depending on your condition(s) your provider will need to see you routinely in order to provide you with quality care and prescribe medication(s). Most chronic conditions (Example: hypertension, Diabetes, depression/anxiety... etc), require visits a couple times a year. Your provider will instruct you on proper follow up for your personal medical conditions and history. Please make certain to make follow up appointments for your condition as instructed. Failing to do so could result in lapse in your medication treatment/refills. If you request a refill, and are overdue to be seen on a condition, we will always provide you with a 30 day script (once) to allow you time to schedule.    Medicare wellness (well visit): - we have a wonderful Nurse Maudie Mercury), that will meet with you and provide you will yearly medicare wellness visits. These visits should occur yearly (can not be scheduled less than 1 calendar year apart) and cover preventive health, immunizations, advance directives and screenings you are entitled to yearly through your medicare benefits. Do not miss out on your entitled benefits, this is when medicare will pay for these benefits to be ordered for you.  These are strongly encouraged by your provider and is the appropriate type of visit to make certain you are up to date with all preventive health benefits. If you have not had your medicare wellness exam in the last 12 months, please make certain to schedule one by calling the office and schedule your medicare wellness with Maudie Mercury as soon as possible.   Yearly physical (well visit):  - Adults are recommended to be seen yearly for physicals. Check with your insurance and date of your last physical, most insurances require one  calendar year between physicals. Physicals include all preventive health topics, screenings, medical exam and labs that are appropriate for gender/age and history. You may have fasting labs needed at this visit. This is a well visit (not a sick visit), new problems should not be covered during this visit (see acute visit).  - Pediatric patients are seen more frequently when they are younger. Your provider will advise you on well child visit timing that is appropriate for your their age. - This is not a medicare wellness visit. Medicare wellness exams do not have an exam portion to the visit. Some medicare companies allow for a physical, some do not allow a yearly physical. If your medicare allows a yearly physical you can schedule the medicare wellness with our nurse Maudie Mercury and have your physical with your provider after, on the same day. Please check with insurance for your full benefits.   Late Policy/No Shows:  - all new patients should arrive 15-30 minutes earlier than appointment to allow [place] time  to  obtain all personal  demographics,  insurance information and for you to complete office paperwork. - All established patients should arrive 10-15 minutes earlier than appointment time to update all information and be checked in .  - In our best efforts to run on time, if you are late for your appointment you will be asked to either reschedule or if able, we will work you back into the schedule. There will be a wait time to work you back in the schedule,  depending on availability.  - If you are unable to make it to your appointment as scheduled, please call 24 hours ahead of time to allow [place] to fill the time slot with someone else who needs to be seen. If you do not cancel your appointment ahead of time, you may be charged a no show fee.

## 2017-10-12 NOTE — Progress Notes (Signed)
Carol Norton , 1945-08-12, 72 y.o., female MRN: [id number] Patient Care Team    Relationship Specialty Notifications Start End  [name], DO PCP - General Family Medicine  05/26/16   [name], MD Consulting Physician Orthopedic Surgery  03/11/16   [name], NP Nurse Practitioner Nurse Practitioner  05/26/16   [name], MD Consulting Physician Internal Medicine  05/26/16    Comment: endocrine, thyroid  [name], [name], MD Referring Physician Pulmonary Disease  05/26/16   [name], MD Consulting Physician Neurology  05/26/16   [name], MD Consulting Physician Cardiology  05/26/16   [name], MD Consulting Physician Ophthalmology  05/26/16    Comment: Dr Kathlen Mody at [facility] for glaucoma and cataracts every 3 mos  [name], MD Attending Physician Family Medicine  05/26/16    Comment: sportsmed    Chief Complaint  Patient presents with  . Fall    follow up ER visit     Subjective: Pt presents for an OV after ED visit for a fall in which she sustained a head injury.  Patient fell off of her porch through bushes onto her head.  She has an abrasion on her right forehead.  She has had a CT with ruled out any type of brain injury or bleed.  She reports she feels fine, no headache, blurred vision, nausea or vomit.  She feels very lucky she did not hurt herself worse.  She reports the fall was a Landscape [employer]. She does have some concerns over the actual report of the CT, and these were reviewed with her today. Ct Head Wo Contrast  Result Date: 10/11/2017 CLINICAL DATA:  Pain following fall EXAM: CT HEAD WITHOUT CONTRAST CT CERVICAL SPINE WITHOUT CONTRAST TECHNIQUE: Multidetector CT imaging of the head and cervical spine was performed following the standard protocol without intravenous contrast. Multiplanar CT image reconstructions of the cervical spine were also generated. COMPARISON:  Cervical MRI December 14, 2013 FINDINGS: CT HEAD FINDINGS Brain: The  ventricles are normal in size and configuration. There is no intracranial mass, hemorrhage, extra-axial fluid collection, or midline shift. There is slight small vessel disease in the centra semiovale bilaterally. Elsewhere gray-white compartments appear normal. No evident acute infarct. Vascular: No hyperdense vessels. There is calcification in each carotid siphon region. Skull: The bony calvarium appears intact. Sinuses/Orbits: There is mucosal thickening in multiple ethmoid air cells bilaterally with opacification in several ethmoid air cells. There is also mucosal thickening in the right maxillary antrum. Other visualized paranasal sinuses are clear. Orbits appear symmetric bilaterally. Other: There is opacification in several mastoid air cells bilaterally. CT CERVICAL SPINE FINDINGS Alignment: There is 2 mm of retrolisthesis of C4 on C5. There is 1 mm of retrolisthesis of C5 on C6. No other spondylolisthesis. Skull base and vertebrae: Skull base and craniocervical junction regions appear normal. No fracture evident. No blastic or lytic bone lesions. Soft tissues and spinal canal: Prevertebral soft tissues and predental space regions are normal. There are no paraspinous lesions. There is no cord or canal hematoma evident. Disc levels: There is moderately severe disc space narrowing at C4-5, C5-6, C6-7, and C7-T1. There is facet osteoarthritic change at multiple levels. There is exit foraminal narrowing due to bony hypertrophy at C4-5 on the right, at C5-6 bilaterally, and at C6-7 on the left. There is no frank disc extrusion or high-grade stenosis. Upper chest: Visualized upper lung regions are clear. Other: There is calcification in the left carotid and subclavian  arteries. IMPRESSION: CT head: No mass or hemorrhage. There is slight periventricular small vessel disease. No acute infarct. There are foci of arterial vascular calcification. There is mild paranasal sinus disease as well as bilateral opacification  in several mastoid air cells. CT cervical spine: No demonstrable fracture. Mild spondylolisthesis at C4-5 and C5-6 is felt to be due to underlying spondylosis. There is multilevel osteoarthritic change. There are foci of vascular calcification in left carotid and left subclavian arteries. Electronically Signed   By: Lowella Grip III M.D.   On: 10/11/2017 15:34   Ct Cervical Spine Wo Contrast  Result Date: 10/11/2017 CLINICAL DATA:  Pain following fall EXAM: CT HEAD WITHOUT CONTRAST CT CERVICAL SPINE WITHOUT CONTRAST TECHNIQUE: Multidetector CT imaging of the head and cervical spine was performed following the standard protocol without intravenous contrast. Multiplanar CT image reconstructions of the cervical spine were also generated. COMPARISON:  Cervical MRI December 14, 2013 FINDINGS: CT HEAD FINDINGS Brain: The ventricles are normal in size and configuration. There is no intracranial mass, hemorrhage, extra-axial fluid collection, or midline shift. There is slight small vessel disease in the centra semiovale bilaterally. Elsewhere gray-white compartments appear normal. No evident acute infarct. Vascular: No hyperdense vessels. There is calcification in each carotid siphon region. Skull: The bony calvarium appears intact. Sinuses/Orbits: There is mucosal thickening in multiple ethmoid air cells bilaterally with opacification in several ethmoid air cells. There is also mucosal thickening in the right maxillary antrum. Other visualized paranasal sinuses are clear. Orbits appear symmetric bilaterally. Other: There is opacification in several mastoid air cells bilaterally. CT CERVICAL SPINE FINDINGS Alignment: There is 2 mm of retrolisthesis of C4 on C5. There is 1 mm of retrolisthesis of C5 on C6. No other spondylolisthesis. Skull base and vertebrae: Skull base and craniocervical junction regions appear normal. No fracture evident. No blastic or lytic bone lesions. Soft tissues and spinal canal: Prevertebral  soft tissues and predental space regions are normal. There are no paraspinous lesions. There is no cord or canal hematoma evident. Disc levels: There is moderately severe disc space narrowing at C4-5, C5-6, C6-7, and C7-T1. There is facet osteoarthritic change at multiple levels. There is exit foraminal narrowing due to bony hypertrophy at C4-5 on the right, at C5-6 bilaterally, and at C6-7 on the left. There is no frank disc extrusion or high-grade stenosis. Upper chest: Visualized upper lung regions are clear. Other: There is calcification in the left carotid and subclavian arteries. IMPRESSION: CT head: No mass or hemorrhage. There is slight periventricular small vessel disease. No acute infarct. There are foci of arterial vascular calcification. There is mild paranasal sinus disease as well as bilateral opacification in several mastoid air cells. CT cervical spine: No demonstrable fracture. Mild spondylolisthesis at C4-5 and C5-6 is felt to be due to underlying spondylosis. There is multilevel osteoarthritic change. There are foci of vascular calcification in left carotid and left subclavian arteries. Electronically Signed   By: Lowella Grip III M.D.   On: 10/11/2017 15:34     Depression screen [facility] 2/9 10/12/2017 09/23/2016 05/26/2016  Decreased Interest 0 0 0  Down, Depressed, Hopeless 0 0 0  PHQ - 2 Score 0 0 0    Allergies  Allergen Reactions  . Synthroid [Levothyroxine Sodium] Shortness Of Breath  . Accolate [Zafirlukast]   . Avelox [Moxifloxacin Hcl In Nacl]   . Citalopram   . Crestor [Rosuvastatin]   . Dexilant [Dexlansoprazole]   . Escitalopram Oxalate   . Singulair [Montelukast Sodium]  Aggression,anger  . Statins     Myalgias, joint pain.  . Wellbutrin [Bupropion]   . Dymista [Azelastine-Fluticasone]   . Simvastatin Palpitations   Social History   Tobacco Use  . Smoking status: Former Smoker    Packs/day: 0.75    Years: 25.00    Pack years: 18.75    Types: Cigarettes     Last attempt to quit: 05/25/2007    Years since quitting: 10.3  . Smokeless tobacco: Never Used  . Tobacco comment: quit for 10 years in the total 53yrs.   Substance Use Topics  . Alcohol use: No    Comment: rare   Past Medical History:  Diagnosis Date  . Allergic rhinitis   . Allergy   . Anxiety   . Bilateral carpal tunnel syndrome    Patient scheduled for surgery with neurology  . Cataract   . Chronic constipation   . Chronic headache   . Chronic sinusitis   . GERD (gastroesophageal reflux disease)   . Glaucoma   . Hyperlipemia   . Hypothyroid   . Leg length discrepancy   . Ocular rosacea   . Plantar fasciitis   . Sebaceous cyst   . Sleep apnea    on CPAP  . Urethral stenosis   . Vitamin D deficiency    Past Surgical History:  Procedure Laterality Date  . ABDOMINAL HYSTERECTOMY  1983  . BREAST SURGERY     sebaceous cyst  . EYE SURGERY     surgery with laser  . FACIAL COSMETIC SURGERY     1995  . GANGLION CYST EXCISION    . MENISCUS REPAIR Left   . RHINOPLASTY    . SEPTOPLASTY    . TUBAL LIGATION     Family History  Problem Relation Age of Onset  . Emphysema Father   . Alcohol abuse Father   . Allergies Mother   . Heart disease Mother   . Ovarian cancer Mother   . Hyperlipidemia Mother   . Hypothyroidism Mother   . Arthritis Mother   . Hypothyroidism Sister   . Lung cancer Paternal Aunt   . Diabetes Maternal Grandmother   . Heart disease Maternal Grandmother   . Stroke Paternal Grandfather    Allergies as of 10/12/2017      Reactions   Synthroid [levothyroxine Sodium] Shortness Of Breath   Accolate [zafirlukast]    Avelox [moxifloxacin Hcl In Nacl]    Citalopram    Crestor [rosuvastatin]    Dexilant [dexlansoprazole]    Escitalopram Oxalate    Singulair [montelukast Sodium]    Aggression,anger   Statins    Myalgias, joint pain.   Wellbutrin [bupropion]    Dymista [azelastine-fluticasone]    Simvastatin Palpitations      Medication  List        Accurate as of 10/12/17  9:49 AM. Always use your most recent med list.          ALIGN PO Take by mouth daily.   ALPRAZolam 0.5 MG tablet Commonly known as:  XANAX Take 1 tablet (0.5 mg total) by mouth at bedtime as needed.   ARMOUR THYROID 60 MG tablet Generic drug:  thyroid Take 1 tablet (60 mg total) by mouth daily.   doxycycline 100 MG capsule Commonly known as:  MONODOX Take 100 mg by mouth daily.   Magnesium 100 MG Caps Take 400 mg by mouth.   meloxicam 15 MG tablet Commonly known as:  MOBIC Take 1 tablet (15 mg total) by  mouth as needed for pain.   MIRALAX powder Generic drug:  polyethylene glycol powder Take 1 Container by mouth once.   omeprazole 40 MG capsule Commonly known as:  PRILOSEC Take 1 capsule (40 mg total) by mouth daily. Needs office visit prior to anymore refills   OVER THE COUNTER MEDICATION Retaine eye drops   PERDIEM PO Take 3 tablets by mouth at bedtime.   TUMS PO Take by mouth every morning.   Vitamin D (Ergocalciferol) 50000 units Caps capsule Commonly known as:  DRISDOL Take 1 capsule (50,000 Units total) by mouth every 7 (seven) days.   XIIDRA 5 % Soln Generic drug:  Lifitegrast INSTILL 1 DROP IN BOTH EYES TWICE A DAY       All past medical history, surgical history, allergies, family history, immunizations andmedications were updated in the EMR today and reviewed under the history and medication portions of their EMR.     ROS: Negative, with the exception of above mentioned in HPI   Objective:  BP 138/85 (BP Location: Right Arm, Patient Position: Sitting, Cuff Size: Large)   Pulse 75   Temp 97.8 F (36.6 C)   Resp 20   Ht 5\' 7"  (1.702 m)   Wt 202 lb 8 oz (91.9 kg)   SpO2 97%   BMI 31.72 kg/m   Body mass index is 31.72 kg/m. Gen: Afebrile. No acute distress. Nontoxic in appearance, well developed, well nourished.  HENT: AT. [place].  MMM, no oral lesions. Eyes:Pupils Equal Round Reactive to light,  Extraocular movements intact,  Conjunctiva without redness, discharge or icterus. CV: RRR, no edema Chest: CTAB, no wheeze or crackles. Good air movement, normal resp effort.  Skin: no rashes, purpura or petechiae.  Abrasions right side of forehead, healing well, no drainage. Neuro: Normal gait. PERLA. EOMi. Alert. Oriented x3 Cranial nerves II through XII intact. Muscle strength 5/5 upper/lower extremity. DTRs equal bilaterally. Psych: Normal affect, dress and demeanor. Normal speech. Normal thought content and judgment.  No results found for this or any previous visit (from the past 24 hour(s)).  Assessment/Plan: Aelyn Stanaland is a 72 y.o. female present for OV for  Fall, initial encounter/Injury of head, initial encounter -CT without head/brain injury.  Abrasions of forehead are healing well.  She has no residual symptoms.  Disorder of both mastoids/Chronic sinusitis, unspecified location -He does suffer from chronic sinusitis.  She has had multiple surgeries of her nose that have been unhelpful with her condition.  She has glaucoma and unable to use steroids.  She is allergic or has side effects to many antihistamines/singular sinus type medications.  CT confirms chronic sinusitis and mastoid cells opacification.  Would like referral to ENT to further discuss, feel this is a reasonable request. - Ambulatory referral to ENT  Carotid calcification: -Mild calcification left carotid and left subclavian on CT.  She is asymptomatic, discussed this with her today.  We will consider carotid ultrasound on her CPE, which she was encouraged to schedule.   Reviewed expectations re: course of current medical issues.  Discussed self-management of symptoms.  Outlined signs and symptoms indicating need for more acute intervention.  Patient verbalized understanding and all questions were answered.  Patient received an After-Visit Summary.    No orders of the defined types were placed in this  encounter.    Note is dictated utilizing voice recognition software. Although note has been proof read prior to signing, occasional typographical errors still can be missed. If any questions arise, please do not  hesitate to call for verification.   electronically signed by:  Howard Pouch, DO  [place]

## 2017-10-14 ENCOUNTER — Encounter: Payer: Self-pay | Admitting: Family Medicine

## 2017-10-14 DIAGNOSIS — I6522 Occlusion and stenosis of left carotid artery: Secondary | ICD-10-CM | POA: Insufficient documentation

## 2017-10-26 ENCOUNTER — Ambulatory Visit: Payer: Medicare Other | Admitting: Family Medicine

## 2017-10-26 ENCOUNTER — Encounter: Payer: Self-pay | Admitting: Family Medicine

## 2017-10-26 VITALS — BP 111/71 | HR 80 | Temp 98.0°F | Resp 20 | Ht 67.0 in | Wt 205.0 lb

## 2017-10-26 DIAGNOSIS — K219 Gastro-esophageal reflux disease without esophagitis: Secondary | ICD-10-CM | POA: Diagnosis not present

## 2017-10-26 DIAGNOSIS — F1310 Sedative, hypnotic or anxiolytic abuse, uncomplicated: Secondary | ICD-10-CM | POA: Diagnosis not present

## 2017-10-26 DIAGNOSIS — E039 Hypothyroidism, unspecified: Secondary | ICD-10-CM

## 2017-10-26 DIAGNOSIS — F411 Generalized anxiety disorder: Secondary | ICD-10-CM | POA: Diagnosis not present

## 2017-10-26 DIAGNOSIS — M1712 Unilateral primary osteoarthritis, left knee: Secondary | ICD-10-CM | POA: Diagnosis not present

## 2017-10-26 MED ORDER — ALPRAZOLAM 0.5 MG PO TABS: 0.5000 mg | ORAL_TABLET | Freq: Every day | ORAL | 1 refills | Status: DC

## 2017-10-26 MED ORDER — MELOXICAM 15 MG PO TABS: 15.0000 mg | ORAL_TABLET | ORAL | 1 refills | Status: DC | PRN

## 2017-10-26 MED ORDER — OMEPRAZOLE 40 MG PO CPDR: 40.0000 mg | DELAYED_RELEASE_CAPSULE | Freq: Every day | ORAL | 1 refills | Status: DC

## 2017-10-26 NOTE — Patient Instructions (Signed)
Refills provided today. F/U every 6 months, face to face needed.

## 2017-10-26 NOTE — Progress Notes (Signed)
Carol Norton , Oct 28, 1945, 72 y.o., female MRN: [id number] Patient Care Team    Relationship Specialty Notifications Start End  [name], DO PCP - General Family Medicine  05/26/16   [name], MD Consulting Physician Orthopedic Surgery  03/11/16   [name], NP Nurse Practitioner Nurse Practitioner  05/26/16   [name], MD Consulting Physician Internal Medicine  05/26/16    Comment: endocrine, thyroid  [name], [name], MD Referring Physician Pulmonary Disease  05/26/16   [name], MD Consulting Physician Neurology  05/26/16   [name], MD Consulting Physician Cardiology  05/26/16   [name], MD Consulting Physician Ophthalmology  05/26/16    Comment: Dr Kathlen Mody at [facility] for glaucoma and cataracts every 3 mos  [name], MD Attending Physician Family Medicine  05/26/16    Comment: sportsmed    Chief Complaint  Patient presents with  . Anxiety    medication refill xanax     Subjective:  Anxiety: Patient takes Xanax 0.5 mg daily at bedtime. She does have a controlled substance contract. [place] told substance database was reviewed 10/26/17 and appropriate. Patient has been counseled on the addictive properties of benzodiazepines. She has been tried on other medications with side effects.  Arthritis: Patient is prescribed 15 mg daily. She feels this medication works very well her. She has not been using it in the last few weeks secondary to recent pain meds with ortho procedure.   GERD: She is prescribed omeprazole 40 mg daily.She takes the medicine as needed now. She has tried to wean off medication but has been unable.  Depression screen [facility] 2/9 10/12/2017 09/23/2016 05/26/2016  Decreased Interest 0 0 0  Down, Depressed, Hopeless 0 0 0  PHQ - 2 Score 0 0 0    Allergies  Allergen Reactions  . Synthroid [Levothyroxine Sodium] Shortness Of Breath  . Accolate [Zafirlukast]   . Avelox [Moxifloxacin Hcl In Nacl]   . Citalopram   .  Crestor [Rosuvastatin]   . Dexilant [Dexlansoprazole]   . Escitalopram Oxalate   . Singulair [Montelukast Sodium]     Aggression,anger  . Statins     Myalgias, joint pain.  . Wellbutrin [Bupropion]   . Dymista [Azelastine-Fluticasone]   . Simvastatin Palpitations   Social History   Tobacco Use  . Smoking status: Former Smoker    Packs/day: 0.75    Years: 25.00    Pack years: 18.75    Types: Cigarettes    Last attempt to quit: 05/25/2007    Years since quitting: 10.4  . Smokeless tobacco: Never Used  . Tobacco comment: quit for 10 years in the total 80yrs.   Substance Use Topics  . Alcohol use: No    Comment: rare   Past Medical History:  Diagnosis Date  . Allergic rhinitis   . Allergy   . Anxiety   . Bilateral carpal tunnel syndrome    Patient scheduled for surgery with neurology  . Cataract   . Chronic constipation   . Chronic headache   . Chronic sinusitis   . GERD (gastroesophageal reflux disease)   . Glaucoma   . Hyperlipemia   . Hypothyroid   . Leg length discrepancy   . Ocular rosacea   . Plantar fasciitis   . Sebaceous cyst   . Sleep apnea    on CPAP  . Urethral stenosis   . Vitamin D deficiency    Past Surgical History:  Procedure Laterality Date  .  ABDOMINAL HYSTERECTOMY  1983  . BREAST SURGERY     sebaceous cyst  . EYE SURGERY     surgery with laser  . FACIAL COSMETIC SURGERY     1995  . GANGLION CYST EXCISION    . MENISCUS REPAIR Left   . RHINOPLASTY    . SEPTOPLASTY    . TUBAL LIGATION     Family History  Problem Relation Age of Onset  . Emphysema Father   . Alcohol abuse Father   . Allergies Mother   . Heart disease Mother   . Ovarian cancer Mother   . Hyperlipidemia Mother   . Hypothyroidism Mother   . Arthritis Mother   . Hypothyroidism Sister   . Lung cancer Paternal Aunt   . Diabetes Maternal Grandmother   . Heart disease Maternal Grandmother   . Stroke Paternal Grandfather    Allergies as of 10/26/2017      Reactions    Synthroid [levothyroxine Sodium] Shortness Of Breath   Accolate [zafirlukast]    Avelox [moxifloxacin Hcl In Nacl]    Citalopram    Crestor [rosuvastatin]    Dexilant [dexlansoprazole]    Escitalopram Oxalate    Singulair [montelukast Sodium]    Aggression,anger   Statins    Myalgias, joint pain.   Wellbutrin [bupropion]    Dymista [azelastine-fluticasone]    Simvastatin Palpitations      Medication List        Accurate as of 10/26/17  1:34 PM. Always use your most recent med list.          ALIGN PO Take by mouth daily.   ALPRAZolam 0.5 MG tablet Commonly known as:  XANAX Take 1 tablet (0.5 mg total) by mouth at bedtime.   ARMOUR THYROID 60 MG tablet Generic drug:  thyroid Take 1 tablet (60 mg total) by mouth daily.   doxycycline 100 MG capsule Commonly known as:  MONODOX Take 100 mg by mouth daily.   Magnesium 100 MG Caps Take 400 mg by mouth.   meloxicam 15 MG tablet Commonly known as:  MOBIC Take 1 tablet (15 mg total) by mouth as needed for pain.   omeprazole 40 MG capsule Commonly known as:  PRILOSEC Take 1 capsule (40 mg total) by mouth daily. Needs office visit prior to anymore refills   OVER THE COUNTER MEDICATION Retaine eye drops   TUMS PO Take by mouth every morning.   vitamin B-6 250 MG tablet Take 750 mg by mouth daily.   Vitamin D (Ergocalciferol) 50000 units Caps capsule Commonly known as:  DRISDOL Take 1 capsule (50,000 Units total) by mouth every 7 (seven) days.       All past medical history, surgical history, allergies, family history, immunizations andmedications were updated in the EMR today and reviewed under the history and medication portions of their EMR.     ROS: Negative, with the exception of above mentioned in HPI   Objective:  BP 111/71 (BP Location: Left Arm, Patient Position: Sitting, Cuff Size: Large)   Pulse 80   Temp 98 F (36.7 C)   Resp 20   Ht 5\' 7"  (1.702 m)   Wt 205 lb (93 kg)   SpO2 97%   BMI 32.11  kg/m  Body mass index is 32.11 kg/m. Gen: Afebrile. No acute distress.  HENT: AT. [place]. MMM.  Eyes:Pupils Equal Round Reactive to light, Extraocular movements intact,  Conjunctiva without redness, discharge or icterus. CV: RRR, no edema Chest: CTAB, no wheeze or crackles Skin:  no rashes, purpura or petechiae.  Neuro:  Normal gait. PERLA. EOMi. Alert. Oriented x3  Psych: Normal affect, dress and demeanor. Normal speech. Normal thought content and judgment..    No exam data present No results found. No results found for this or any previous visit (from the past 24 hour(s)).  Assessment/Plan: Izela Altier is a 72 y.o. female present for OV for  Acquired hypothyroidism Continue following with endocrinology. Stable, reviewed last TSH.   Primary osteoarthritis of left knee Stable. Refills provided today. - meloxicam (MOBIC) 15 MG tablet; Take 1 tablet (15 mg total) by mouth as needed for pain.  Dispense: 90 tablet; Refill: 1  Mild benzodiazepine use disorder (HCC) Generalized anxiety disorder - Anxiety and insomnia stable with very low-dose Xanax daily at bedtime. Patient has been  counseled on addictive properties of benzodiazepines as well as side effect profile in geriatric population. - Controlled substance contract has been signed. [place] controlled substance database has been reviewed 10/26/17 , appropriate,  made part of her permanent chart. Patient is aware that she will need to be seen every 6 months face-to-face with this provider for refills on this medication.    Gastroesophageal reflux disease, esophagitis presence not specified Stable. Refills provided on omeprazole 40 mg daily.  Follow-up in 6 months   Reviewed expectations re: course of current medical issues.  Discussed self-management of symptoms.  Outlined signs and symptoms indicating need for more acute intervention.  Patient verbalized understanding and all questions were answered.  Patient received  an After-Visit Summary.    No orders of the defined types were placed in this encounter.    Note is dictated utilizing voice recognition software. Although note has been proof read prior to signing, occasional typographical errors still can be missed. If any questions arise, please do not hesitate to call for verification.   electronically signed by:  Howard Pouch, DO  [place]

## 2017-10-31 ENCOUNTER — Telehealth: Payer: Self-pay | Admitting: Internal Medicine

## 2017-11-01 ENCOUNTER — Other Ambulatory Visit: Payer: Medicare Other

## 2017-11-01 DIAGNOSIS — E039 Hypothyroidism, unspecified: Secondary | ICD-10-CM

## 2017-11-01 LAB — T3, FREE: T3 FREE: 3.4 pg/mL (ref 2.3–4.2)

## 2017-11-01 LAB — TSH: TSH: 1.54 u[IU]/mL (ref 0.35–4.50)

## 2017-11-01 LAB — T4, FREE: Free T4: 0.55 ng/dL — ABNORMAL LOW (ref 0.60–1.60)

## 2017-11-01 NOTE — Telephone Encounter (Signed)
error 

## 2017-11-03 ENCOUNTER — Encounter: Payer: Self-pay | Admitting: Internal Medicine

## 2017-11-03 ENCOUNTER — Other Ambulatory Visit: Payer: Self-pay | Admitting: Internal Medicine

## 2017-11-03 ENCOUNTER — Ambulatory Visit (INDEPENDENT_AMBULATORY_CARE_PROVIDER_SITE_OTHER): Payer: Medicare Other | Admitting: Internal Medicine

## 2017-11-03 VITALS — BP 138/72 | HR 76 | Ht 67.0 in | Wt 205.0 lb

## 2017-11-03 DIAGNOSIS — E559 Vitamin D deficiency, unspecified: Secondary | ICD-10-CM | POA: Diagnosis not present

## 2017-11-03 DIAGNOSIS — E039 Hypothyroidism, unspecified: Secondary | ICD-10-CM | POA: Diagnosis not present

## 2017-11-03 MED ORDER — SYNTHROID 50 MCG PO TABS: 100.0000 ug | ORAL_TABLET | Freq: Every day | ORAL | 5 refills | Status: DC

## 2017-11-03 NOTE — Telephone Encounter (Signed)
Pharmacy sent note that insurance will cover generic is that okay?

## 2017-11-03 NOTE — Telephone Encounter (Signed)
No, she needs brand name.Marland KitchenMarland Kitchen

## 2017-11-03 NOTE — Patient Instructions (Signed)
Please try to change from Armour to Synthroid 2x 50 mcg tablets daily. If you do well on this, come back for labs in 1.5 months. If you do not like it, switch back to Armour.  Please return in 1 year

## 2017-11-03 NOTE — Progress Notes (Signed)
Patient ID: Carol Norton, female   DOB: July 21, 1945, 72 y.o.   MRN: [id number]    HPI  Carol Norton is a 72 y.o.-year-old female, initially referred by Laretta Alstrom, NP, returning for f/u for  hypothyroidism. Last visit 11 months ago.  Reviewed history: Pt. has been dx with hypothyroidism in 1990 >> on Levothyroxine initially, then tried Armour >> then back to Levothyroxine >> increased hair loss, fatigue, SOB, wheezing, HAs >> switched to Armour >> started to feel much better: Less fatigue, more active, no HA.   She takes Armour 60 mg 6 out of 7 days and  30 mg 1 out of 7 days >> 60 mg daily few mo ago: - Fasting, chews it  - with water or diet Mtn due - separated by more than 2 hours from breakfast - no calcium, iron, multivitamins, PPIs - + Occasional Tums >4-6 hrs during the day  Reviewed patient's TFTs: Lab Results  Component Value Date   TSH 1.54 11/01/2017   TSH 1.06 10/08/2016   TSH 1.29 05/21/2016   TSH 0.23 (L) 02/02/2016   TSH 0.73 08/12/2015   TSH 1.090 05/13/2015   TSH 0.220 (L) 04/09/2015   TSH 0.143 (L) 02/25/2015   TSH 1.231 12/17/2014   FREET4 0.55 (L) 11/01/2017   FREET4 0.56 (L) 10/08/2016   FREET4 0.58 (L) 05/21/2016   FREET4 0.61 02/02/2016   FREET4 0.97 04/09/2015   FREET4 0.85 02/25/2015   FREET4 0.71 (L) 12/17/2014   12/15/2015: TSH 0.060 on LT4 100 >> decreased LT4 to 88 >> then switched to Armour 60 mg 10/21/2015: 0.285 09/12/2015: 0.941  Thyroid antibodies were not elevated: Component     Latest Ref Rng & Units 02/02/2016  Thyroglobulin Ab     <2 IU/mL <1  Thyroperoxidase Ab SerPl-aCnc     <9 IU/mL 2   Pt denies: - feeling nodules in neck - hoarseness - dysphagia - choking - SOB with lying down  However, she complains of: -Heat intolerance -Hair loss  She has + FH of thyroid disorders in: M and sister. No FH of thyroid cancer. No h/o radiation tx to head or neck.  No seaweed or kelp. No recent contrast studies. No herbal supplements.  No Biotin use. No recent steroids use.   Started Mg, B6, C vitamins.  Since last visit, she had surgery for carpal tunnel.  She also had worsening sinus drainage.  She has a history of vitamin D deficiency and was previously on  Ergocalciferol 100,000 units weekly.  However, she ran out of the medication and she is now not on any supplement.  Reviewed vitamin D levels: Lab Results  Component Value Date   VD25OH 31 05/13/2016   VD25OH 29 (L) 12/23/2015   VD25OH 24 (L) 09/11/2015   VD25OH 29 (L) 05/27/2015   VD25OH 22 (L) 02/25/2015   ROS: Constitutional: + See HPI; + weight gain Eyes: no blurry vision, no xerophthalmia ENT: no sore throat,  + see HPI Cardiovascular: no CP/no SOB/no palpitations/no leg swelling Respiratory: no cough/no SOB/no wheezing Gastrointestinal: + N/no V/no D/no C/+ acid reflux Musculoskeletal: + Muscle aches/ + joint aches Skin: no rashes, + hair loss Neurological: no tremors/no numbness/no tingling/no dizziness  I reviewed pt's medications, allergies, PMH, social hx, family hx, and changes were documented in the history of present illness. Otherwise, unchanged from my initial visit note.  Past Medical History:  Diagnosis Date  . Allergic rhinitis   . Allergy   . Anxiety   . Bilateral  carpal tunnel syndrome    Patient scheduled for surgery with neurology  . Cataract   . Chronic constipation   . Chronic headache   . Chronic sinusitis   . GERD (gastroesophageal reflux disease)   . Glaucoma   . Hyperlipemia   . Hypothyroid   . Leg length discrepancy   . Ocular rosacea   . Plantar fasciitis   . Sebaceous cyst   . Sleep apnea    on CPAP  . Urethral stenosis   . Vitamin D deficiency    Past Surgical History:  Procedure Laterality Date  . ABDOMINAL HYSTERECTOMY  1983  . BREAST SURGERY     sebaceous cyst  . EYE SURGERY     surgery with laser  . FACIAL COSMETIC SURGERY     1995  . GANGLION CYST EXCISION    . MENISCUS REPAIR Left   .  RHINOPLASTY    . SEPTOPLASTY    . TUBAL LIGATION     Social History   Socioeconomic History  . Marital status: Married    Spouse name: [name]  . Number of children: 2  . Years of education: 74  . Highest education level: Not on file  Occupational History  . Occupation: retired  Scientific laboratory technician  . Financial resource strain: Not on file  . Food insecurity:    Worry: Not on file    Inability: Not on file  . Transportation needs:    Medical: Not on file    Non-medical: Not on file  Tobacco Use  . Smoking status: Former Smoker    Packs/day: 0.75    Years: 25.00    Pack years: 18.75    Types: Cigarettes    Last attempt to quit: 05/25/2007    Years since quitting: 10.4  . Smokeless tobacco: Never Used  . Tobacco comment: quit for 10 years in the total 22yrs.   Substance and Sexual Activity  . Alcohol use: No    Comment: rare  . Drug use: No  . Sexual activity: Yes    Partners: Male    Birth control/protection: Surgical    Comment: Married  Lifestyle  . Physical activity:    Days per week: Not on file    Minutes per session: Not on file  . Stress: Not on file  Relationships  . Social connections:    Talks on phone: Not on file    Gets together: Not on file    Attends religious service: Not on file    Active member of club or organization: Not on file    Attends meetings of clubs or organizations: Not on file    Relationship status: Not on file  . Intimate partner violence:    Fear of current or ex partner: Not on file    Emotionally abused: Not on file    Physically abused: Not on file    Forced sexual activity: Not on file  Other Topics Concern  . Not on file  Social History Narrative   Married to [place]. 2 children.   Dinks caffeine.   Patient is right handed.    Exercises routinely.   Wears seatbelt, smoke detector at home.   Feels safe in relationship.   Current Outpatient Medications on File Prior to Visit  Medication Sig Dispense Refill  . ALPRAZolam  (XANAX) 0.5 MG tablet Take 1 tablet (0.5 mg total) by mouth at bedtime. 90 tablet 1  . ARMOUR THYROID 60 MG tablet Take 1 tablet (60 mg total) by  mouth daily. 90 tablet 3  . Calcium Carbonate Antacid (TUMS PO) Take by mouth every morning.    Marland Kitchen doxycycline (MONODOX) 100 MG capsule Take 100 mg by mouth daily.    . Magnesium 100 MG CAPS Take 400 mg by mouth.     . meloxicam (MOBIC) 15 MG tablet Take 1 tablet (15 mg total) by mouth as needed for pain. 90 tablet 1  . omeprazole (PRILOSEC) 40 MG capsule Take 1 capsule (40 mg total) by mouth daily. Needs office visit prior to anymore refills 90 capsule 1  . OVER THE COUNTER MEDICATION Retaine eye drops    . Probiotic Product (ALIGN PO) Take by mouth daily.    . Pyridoxine HCl (VITAMIN B-6) 250 MG tablet Take 750 mg by mouth daily.    . Vitamin D, Ergocalciferol, (DRISDOL) 50000 units CAPS capsule Take 1 capsule (50,000 Units total) by mouth every 7 (seven) days. (Patient not taking: Reported on 10/26/2017) 12 capsule 3   No current facility-administered medications on file prior to visit.    Allergies  Allergen Reactions  . Synthroid [Levothyroxine Sodium] Shortness Of Breath  . Accolate [Zafirlukast]   . Avelox [Moxifloxacin Hcl In Nacl]   . Citalopram   . Crestor [Rosuvastatin]   . Dexilant [Dexlansoprazole]   . Escitalopram Oxalate   . Singulair [Montelukast Sodium]     Aggression,anger  . Statins     Myalgias, joint pain.  . Wellbutrin [Bupropion]   . Dymista [Azelastine-Fluticasone]   . Simvastatin Palpitations   Family History  Problem Relation Age of Onset  . Emphysema Father   . Alcohol abuse Father   . Allergies Mother   . Heart disease Mother   . Ovarian cancer Mother   . Hyperlipidemia Mother   . Hypothyroidism Mother   . Arthritis Mother   . Hypothyroidism Sister   . Lung cancer Paternal Aunt   . Diabetes Maternal Grandmother   . Heart disease Maternal Grandmother   . Stroke Paternal Grandfather    PE: BP 138/72    Pulse 76   Ht 5\' 7"  (1.702 m)   Wt 205 lb (93 kg)   SpO2 97%   BMI 32.11 kg/m  Wt Readings from Last 3 Encounters:  11/03/17 205 lb (93 kg)  10/26/17 205 lb (93 kg)  10/12/17 202 lb 8 oz (91.9 kg)   Constitutional: overweight, in NAD Eyes: PERRLA, EOMI, no exophthalmos ENT: moist mucous membranes, no thyromegaly, but slight left lobe prominence ;no cervical lymphadenopathy Cardiovascular: RRR, No MRG Respiratory: CTA B Gastrointestinal: abdomen soft, NT, ND, BS+ Musculoskeletal: no deformities, strength intact in all 4 Skin: moist, warm, no rashes Neurological: no tremor with outstretched hands, DTR normal in all 4  ASSESSMENT: 1. Hypothyroidism - She feels much better after switching to Armour in 2017, compared to levothyroxine.  She has improved fatigue, weight and less headaches.  2.  Vitamin D deficiency  PLAN:  1. Patient with long-standing hypothyroidism, on Armour therapy.  She was on 60 mg of Armour 6 out of 7 days and 30 mg of Armour 1 out of 7 days, however, she wanted to simplify the regimen and is now taking 60 mg of Armour daily.  - We will need to do PA for Armour since this was not covered by her insurance.  However, due to price and also, since she still has some heat intolerance and fatigue, she would be interested to retry Synthroid brand name.  I explained that usually intolerance to the thyroid  hormones occurs (if the dose is correct) due to the dyes and other excipients in the tablets.  She agrees to try the white tablets, a 50 mcg daily.  Since she is currently taking 60 mg of Armour, the equivalent of 100 mcg levothyroxine daily, will switch to this dose.  If she can continue on this, we will have her back for labs in 1.5 months.  If she does not like how she feels on this (she developed shortness of breath and wheezing from levothyroxine in the past, she was switched right back to Armour 60 mg daily. - latest thyroid labs reviewed with pt >> normal 2 days  ago: Lab Results  Component Value Date   TSH 1.54 11/01/2017  - we discussed about taking the thyroid hormone every day, with water, >30 minutes before breakfast, separated by >4 hours from acid reflux medications, calcium, iron, multivitamins. Pt. is taking it correctly. - RTC in 1 year  2.  Vitamin D deficiency  - she was on 100,000 units ergocalciferol weekly.  However, she ran out and can only get a refill after she sees PCP in July and gets a new level checked.  - I suggested to start a 5000 units vitamin D supplement, which is over-the-counter  Patient Instructions  Please try to change from Armour to Synthroid 2x 50 mcg tablets daily. If you do well on this, come back for labs in 1.5 months. If you do not like it, switch back to Armour.  Please return in 1 year  Orders Placed This Encounter  Procedures  . T4, free  . TSH   [name], MD PhD [facility] Endocrinology

## 2017-11-08 ENCOUNTER — Ambulatory Visit (INDEPENDENT_AMBULATORY_CARE_PROVIDER_SITE_OTHER): Payer: Medicare Other | Admitting: Allergy and Immunology

## 2017-11-08 VITALS — BP 158/74 | HR 82 | Resp 16

## 2017-11-08 DIAGNOSIS — H1045 Other chronic allergic conjunctivitis: Secondary | ICD-10-CM | POA: Diagnosis not present

## 2017-11-08 DIAGNOSIS — L718 Other rosacea: Secondary | ICD-10-CM

## 2017-11-08 DIAGNOSIS — H04123 Dry eye syndrome of bilateral lacrimal glands: Secondary | ICD-10-CM | POA: Diagnosis not present

## 2017-11-08 DIAGNOSIS — H1013 Acute atopic conjunctivitis, bilateral: Secondary | ICD-10-CM

## 2017-11-08 DIAGNOSIS — G4733 Obstructive sleep apnea (adult) (pediatric): Secondary | ICD-10-CM

## 2017-11-08 DIAGNOSIS — J3089 Other allergic rhinitis: Secondary | ICD-10-CM | POA: Diagnosis not present

## 2017-11-08 MED ORDER — OLOPATADINE HCL 0.6 % NA SOLN: 2.0000 | Freq: Two times a day (BID) | NASAL | 5 refills | Status: DC

## 2017-11-08 NOTE — Patient Instructions (Addendum)
  1. Can try Patanase - 1-2 sprays each nostril 1-2 times per day if needed  2. Continue to Treat and prevent ocular rosacea and dry eye:   A. doxycycline 100 mg daily   B. try to remain away from all oral antihistamines  3. If needed:   A. nasal saline  4. Renew supplies for nasal pillow CPAP  5. Fit for full face mask CPAP  6. Return to clinic in 12 months or earlier if problem

## 2017-11-08 NOTE — Progress Notes (Signed)
Follow-up Note  Referring Provider: [name], DO Primary Provider: [name], DO Date of Office Visit: 11/08/2017  Subjective:   Carol Norton (DOB: 1946/05/09) is a 72 y.o. female who returns to the [facility] on 11/08/2017 in re-evaluation of the following:  HPI: [name] returns to this clinic in reevaluation of her allergic rhinitis and ocular rosacea and dry eye syndrome and sleep apnea.  I last saw her in this clinic on 23 February 2017.  She still continues to have intermittent bouts of nasal stuffiness.  Singulair worked very well in the past but she developed significant CNS side effects from using this medication.  Likewise, nasal steroids helped her in the past but she can no longer use these agents because of her glaucoma.  Thus, she has been using nasal strips when she does develop significant congestion.  She did visit with Dr. Redmond Baseman, ENT, who recommended that she undergo a turbinate reduction and she is presently considering this option.  Her eyes are doing very well as long as she remains on doxycycline.  She is currently using 100 mg daily  She is using a nasal pillow CPAP which is going quite well for her sleep apnea.  However, she would like to use a full facemask at points in time in which her nose is extremely stuffy.  As well, her sleep pulmonologist, Dr. Gwenette Greet, is no longer in town and she needs someone to renew her supplies.  Allergies as of 11/08/2017      Reactions   Synthroid [levothyroxine Sodium] Shortness Of Breath   Accolate [zafirlukast]    Avelox [moxifloxacin Hcl In Nacl]    Citalopram    Crestor [rosuvastatin]    Dexilant [dexlansoprazole]    Escitalopram Oxalate    Singulair [montelukast Sodium]    Aggression,anger   Statins    Myalgias, joint pain.   Wellbutrin [bupropion]    Dymista [azelastine-fluticasone]    Simvastatin Palpitations      Medication List      ALIGN PO Take by mouth daily.   ALPRAZolam 0.5 MG  tablet Commonly known as:  XANAX Take 1 tablet (0.5 mg total) by mouth at bedtime.   ARMOUR THYROID 60 MG tablet Generic drug:  thyroid Take 1 tablet (60 mg total) by mouth daily.   doxycycline 100 MG capsule Commonly known as:  MONODOX Take 100 mg by mouth daily.   levothyroxine 50 MCG tablet Commonly known as:  SYNTHROID, LEVOTHROID Please specify directions, refills and quantity   Magnesium 100 MG Caps Take 800 mg by mouth.   meloxicam 15 MG tablet Commonly known as:  MOBIC Take 1 tablet (15 mg total) by mouth as needed for pain.   omeprazole 40 MG capsule Commonly known as:  PRILOSEC Take 1 capsule (40 mg total) by mouth daily. Needs office visit prior to anymore refills   OVER THE COUNTER MEDICATION Retaine eye drops   TUMS PO Take by mouth every morning.   vitamin B-6 250 MG tablet Take 750 mg by mouth daily.   Vitamin D (Ergocalciferol) 50000 units Caps capsule Commonly known as:  DRISDOL Take 1 capsule (50,000 Units total) by mouth every 7 (seven) days.       Past Medical History:  Diagnosis Date  . Allergic rhinitis   . Allergy   . Anxiety   . Bilateral carpal tunnel syndrome    Patient scheduled for surgery with neurology  . Cataract   . Chronic constipation   .  Chronic headache   . Chronic sinusitis   . GERD (gastroesophageal reflux disease)   . Glaucoma   . Hyperlipemia   . Hypothyroid   . Leg length discrepancy   . Ocular rosacea   . Plantar fasciitis   . Sebaceous cyst   . Sleep apnea    on CPAP  . Urethral stenosis   . Vitamin D deficiency     Past Surgical History:  Procedure Laterality Date  . ABDOMINAL HYSTERECTOMY  1983  . BREAST SURGERY     sebaceous cyst  . EYE SURGERY     surgery with laser  . FACIAL COSMETIC SURGERY     1995  . GANGLION CYST EXCISION    . MENISCUS REPAIR Left   . RHINOPLASTY    . SEPTOPLASTY    . TUBAL LIGATION      Review of systems negative except as noted in HPI / PMHx or noted  below:  Review of Systems  Constitutional: Negative.   HENT: Negative.   Eyes: Negative.   Respiratory: Negative.   Cardiovascular: Negative.   Gastrointestinal: Negative.   Genitourinary: Negative.   Musculoskeletal: Negative.   Skin: Negative.   Neurological: Negative.   Endo/Heme/Allergies: Negative.   Psychiatric/Behavioral: Negative.      Objective:   Vitals:   11/09/17 0841  BP: (!) 158/74  Pulse: 82  Resp: 16          Physical Exam  HENT:  Head: Normocephalic.  Right Ear: Tympanic membrane, external ear and ear canal normal.  Left Ear: Tympanic membrane, external ear and ear canal normal.  Nose: Mucosal edema present. No rhinorrhea.  Mouth/Throat: Uvula is midline, oropharynx is clear and moist and mucous membranes are normal. No oropharyngeal exudate.  Eyes: Conjunctivae are normal.  Neck: Trachea normal. No tracheal tenderness present. No tracheal deviation present. No thyromegaly present.  Cardiovascular: Normal rate, regular rhythm, S1 normal, S2 normal and normal heart sounds.  No murmur heard. Pulmonary/Chest: Breath sounds normal. No stridor. No respiratory distress. She has no wheezes. She has no rales.  Musculoskeletal: She exhibits no edema.  Lymphadenopathy:       Head (right side): No tonsillar adenopathy present.       Head (left side): No tonsillar adenopathy present.    She has no cervical adenopathy.  Neurological: She is alert.  Skin: No rash noted. She is not diaphoretic. No erythema. Nails show no clubbing.    Diagnostics: none  Assessment and Plan:   1. Other allergic rhinitis   2. Perennial allergic conjunctivitis of both eyes   3. Ocular rosacea   4. Dry eye syndrome of both eyes   5. Obstructive sleep apnea syndrome     1. Can try Patanase - 1-2 sprays each nostril 1-2 times per day if needed  2. Continue to Treat and prevent ocular rosacea and dry eye:   A. doxycycline 100 mg daily   B. try to remain away from all oral  antihistamines  3. If needed:   A. nasal saline  4. Renew supplies for nasal pillow CPAP  5. Fit for full face mask CPAP  6. Return to clinic in 12 months or earlier if problem  [name] hopefully will do well with the plan mentioned above which is directed at treating respiratory tract inflammation and rosacea and dry eye syndrome and sleep apnea.  She will use a full facemask with her CPAP at points in time when her nose is very congested and she cannot use  her nasal pillow.  If she does well I will see her back in this clinic in 12 months or earlier if there is a problem.  Allena Katz, MD Allergy / Immunology [place]

## 2017-11-09 ENCOUNTER — Encounter: Payer: Self-pay | Admitting: Allergy and Immunology

## 2017-11-21 ENCOUNTER — Telehealth: Payer: Self-pay | Admitting: Internal Medicine

## 2017-11-21 NOTE — Telephone Encounter (Signed)
Let's only do this until she finishes her [place] and then continue only with Synthroid, if she agrees.

## 2017-11-21 NOTE — Telephone Encounter (Signed)
Pt states that she wants to do that because she has not done well on Synthroid in the past. She said that because of that she has been "leary" of adding the synthroid. Pt has taken Armour daily except Sunday and on Sunday she has taken the Synthroid.  She said that because she does so well on Armour she doesn't see why just doing the Synthroid a few days a week wouldn't help. She said that she feels like since her hysterectomy she was told to never take synthetic hormones and Synthroid is synthetic and she thinks that is why she had so many side effects in the past. No issues with side effects at this time. Please advise

## 2017-11-21 NOTE — Telephone Encounter (Signed)
VM full

## 2017-11-21 NOTE — Telephone Encounter (Signed)
Please advise on below  

## 2017-11-21 NOTE — Telephone Encounter (Signed)
Patient is requesting to start taking Synthroid 1-2 times per week and stay on Armour the rest of the week. Then recheck blood in about 6 weeks to see if T4 is elevated any. Please call patient at ph# [phone] to advise.

## 2017-11-21 NOTE — Telephone Encounter (Signed)
This is a very unusual regimen, I would not suggest to use it. Did she mention why she would like to try this.Marland KitchenMarland Kitchen?

## 2017-11-22 ENCOUNTER — Telehealth: Payer: Self-pay | Admitting: Internal Medicine

## 2017-11-22 NOTE — Telephone Encounter (Signed)
Patient does NOT want to take Synthroid only. She will take it in conjunction with Armour. Would like to check her blood in 4 more weeks to make sure T4 level come up. She feels better on Armour. Synthroid makes her ill (sick in the past from it). She is open to taking a little bit of Synthroid but already knows she does not want to feel the recurrence of how Synthroid makes her feel-short of breath, heart palpitations, anxiety, wheezing. When she stopped Synthroid it all stopped. Please call her at ph# [phone]

## 2017-11-22 NOTE — Telephone Encounter (Signed)
Noted, I already spoke with pt earlier today and told her that we will call her back once we get her blood work to discuss her medications.

## 2017-11-22 NOTE — Telephone Encounter (Signed)
The only reason we changed to Synthroid is that the Armour was not covered by her insurance. If she wants to pay the copay for it, why not stay only on Armour? Please try to see if she prefers to take only Armour, that would definitely be preferred.Marland KitchenMarland Kitchen

## 2017-11-22 NOTE — Telephone Encounter (Signed)
The pt stated that she will take it 2 days a week and then after her blood checked discuss with Dr. Cruzita Lederer what changes she will make. She said that she disagrees to take only Synthroid at this time and does not want to in the future either because she "gets in trouble on Synthroid"

## 2017-11-23 NOTE — Telephone Encounter (Signed)
Pt is fine with paying it out of pocket. She will be taking Armour only now, taking Synthroid out of chart

## 2017-12-09 ENCOUNTER — Telehealth (INDEPENDENT_AMBULATORY_CARE_PROVIDER_SITE_OTHER): Payer: Self-pay | Admitting: Ophthalmology

## 2017-12-09 MED ORDER — ERYTHROMYCIN 5 MG/GM OP OINT: 1.0000 "application " | TOPICAL_OINTMENT | Freq: Three times a day (TID) | OPHTHALMIC | 2 refills | Status: DC

## 2017-12-09 NOTE — Telephone Encounter (Signed)
Pt was seen at [facility] by Dr. Parke Simmers for corneal abrasion OD. Was prescribed erythromycin ung TID OD, but prescription was not sent in.  Pt called in to Ophthalmologist on call to request e'mycin ointment be sent into [facility]. Prescription sent via e-script per patient request.  Gardiner Sleeper, M.D., Ph.D. Diseases & Surgery of the Retina and [place]

## 2018-01-03 ENCOUNTER — Telehealth: Payer: Self-pay | Admitting: Emergency Medicine

## 2018-01-03 NOTE — Telephone Encounter (Signed)
error 

## 2018-01-06 ENCOUNTER — Other Ambulatory Visit: Payer: Self-pay

## 2018-01-06 MED ORDER — ARMOUR THYROID 60 MG PO TABS: ORAL_TABLET | ORAL | 3 refills | Status: DC

## 2018-02-10 ENCOUNTER — Encounter: Payer: Self-pay | Admitting: Family Medicine

## 2018-02-10 ENCOUNTER — Ambulatory Visit: Payer: Medicare Other | Admitting: Family Medicine

## 2018-02-10 ENCOUNTER — Encounter: Payer: Self-pay | Admitting: *Deleted

## 2018-02-10 VITALS — BP 135/84 | HR 82 | Temp 97.0°F | Resp 20 | Ht 67.0 in | Wt 203.0 lb

## 2018-02-10 DIAGNOSIS — Z0289 Encounter for other administrative examinations: Secondary | ICD-10-CM

## 2018-02-10 DIAGNOSIS — Z79899 Other long term (current) drug therapy: Secondary | ICD-10-CM | POA: Insufficient documentation

## 2018-02-10 DIAGNOSIS — F1310 Sedative, hypnotic or anxiolytic abuse, uncomplicated: Secondary | ICD-10-CM

## 2018-02-10 DIAGNOSIS — F411 Generalized anxiety disorder: Secondary | ICD-10-CM | POA: Diagnosis not present

## 2018-02-10 MED ORDER — ALPRAZOLAM 0.5 MG PO TABS: 0.5000 mg | ORAL_TABLET | Freq: Every day | ORAL | 1 refills | Status: DC

## 2018-02-10 NOTE — Progress Notes (Signed)
Carol Norton , [birth year], 72 y.o., female MRN: [id number] Patient Care Team    Relationship Specialty Notifications Start End  [name], DO PCP - General Family Medicine  05/26/16   [name], MD Consulting Physician Orthopedic Surgery  03/11/16   [name], NP Nurse Practitioner Nurse Practitioner  05/26/16   [name], MD Consulting Physician Internal Medicine  05/26/16    Comment: endocrine, thyroid  [name], [name], MD Referring Physician Pulmonary Disease  05/26/16   [name], MD Consulting Physician Neurology  05/26/16   [name], MD Consulting Physician Cardiology  05/26/16   [name], MD Consulting Physician Ophthalmology  05/26/16    Comment: Dr Kathlen Mody at [facility] for glaucoma and cataracts every 3 mos  [name], MD Attending Physician Family Medicine  05/26/16    Comment: [facility]    Chief Complaint  Patient presents with  . Anxiety     Subjective:  Anxiety: Patient takes Xanax 0.5 mg daily at bedtime. She does have a controlled substance contract. [place] controlled  substance database was reviewed 02/10/18 and appropriate. Patient has been counseled on the addictive properties of benzodiazepines. She has been tried on other medications with side effects. She reports using as needed only and denies side effects.   Depression screen [facility] 2/9 10/12/2017 09/23/2016 05/26/2016  Decreased Interest 0 0 0  Down, Depressed, Hopeless 0 0 0  PHQ - 2 Score 0 0 0    Allergies  Allergen Reactions  . Synthroid [Levothyroxine Sodium] Shortness Of Breath  . Accolate [Zafirlukast]   . Avelox [Moxifloxacin Hcl In Nacl]   . Citalopram   . Crestor [Rosuvastatin]   . Dexilant [Dexlansoprazole]   . Escitalopram Oxalate   . Nsaids     Hair loss  . Singulair [Montelukast Sodium]     Aggression,anger  . Statins     Myalgias, joint pain.  . Wellbutrin [Bupropion]   . Dymista [Azelastine-Fluticasone]   . Simvastatin Palpitations    Social History   Tobacco Use  . Smoking status: Former Smoker    Packs/day: 0.75    Years: 25.00    Pack years: 18.75    Types: Cigarettes    Last attempt to quit: 05/25/2007    Years since quitting: 10.7  . Smokeless tobacco: Never Used  . Tobacco comment: quit for 10 years in the total 25yrs.   Substance Use Topics  . Alcohol use: No    Comment: rare   Past Medical History:  Diagnosis Date  . Allergic rhinitis   . Allergy   . Anxiety   . Bilateral carpal tunnel syndrome    Patient scheduled for surgery with neurology  . Cataract   . Chronic constipation   . Chronic headache   . Chronic sinusitis   . GERD (gastroesophageal reflux disease)   . Glaucoma   . Hyperlipemia   . Hypothyroid   . Leg length discrepancy   . Ocular rosacea   . Plantar fasciitis   . Sebaceous cyst   . Sleep apnea    on CPAP  . Urethral stenosis   . Vitamin D deficiency    Past Surgical History:  Procedure Laterality Date  . ABDOMINAL HYSTERECTOMY  1983  . BREAST SURGERY     sebaceous cyst  . EYE SURGERY     surgery with laser  . FACIAL COSMETIC SURGERY     1995  . GANGLION CYST EXCISION    . MENISCUS REPAIR Left   .  RHINOPLASTY    . SEPTOPLASTY    . TUBAL LIGATION     Family History  Problem Relation Age of Onset  . Emphysema Father   . Alcohol abuse Father   . Allergies Mother   . Heart disease Mother   . Ovarian cancer Mother   . Hyperlipidemia Mother   . Hypothyroidism Mother   . Arthritis Mother   . Hypothyroidism Sister   . Lung cancer Paternal Aunt   . Diabetes Maternal Grandmother   . Heart disease Maternal Grandmother   . Stroke Paternal Grandfather    Allergies as of 02/10/2018      Reactions   Synthroid [levothyroxine Sodium] Shortness Of Breath   Accolate [zafirlukast]    Avelox [moxifloxacin Hcl In Nacl]    Citalopram    Crestor [rosuvastatin]    Dexilant [dexlansoprazole]    Escitalopram Oxalate    Nsaids    Hair loss   Singulair [montelukast  Sodium]    Aggression,anger   Statins    Myalgias, joint pain.   Wellbutrin [bupropion]    Dymista [azelastine-fluticasone]    Simvastatin Palpitations      Medication List        Accurate as of 02/10/18 11:06 AM. Always use your most recent med list.          ALIGN PO Take by mouth daily.   ALPRAZolam 0.5 MG tablet Commonly known as:  XANAX Take 1 tablet (0.5 mg total) by mouth at bedtime.   ARMOUR THYROID 60 MG tablet Generic drug:  thyroid Take one tablet daily   doxycycline 100 MG capsule Commonly known as:  MONODOX Take 100 mg by mouth daily.   erythromycin ophthalmic ointment Place 1 application into the right eye 3 (three) times daily.   Magnesium 100 MG Caps Take 800 mg by mouth.   Olopatadine HCl 0.6 % Soln Place 2 sprays into both nostrils 2 (two) times daily.   omeprazole 40 MG capsule Commonly known as:  PRILOSEC Take 1 capsule (40 mg total) by mouth daily. Needs office visit prior to anymore refills   OVER THE COUNTER MEDICATION Retaine eye drops   TUMS PO Take by mouth every morning.   vitamin B-6 250 MG tablet Take 750 mg by mouth daily.   Vitamin D (Ergocalciferol) 50000 units Caps capsule Commonly known as:  DRISDOL Take 1 capsule (50,000 Units total) by mouth every 7 (seven) days.       All past medical history, surgical history, allergies, family history, immunizations andmedications were updated in the EMR today and reviewed under the history and medication portions of their EMR.     ROS: Negative, with the exception of above mentioned in HPI   Objective:  BP 135/84 (BP Location: Right Arm, Patient Position: Sitting, Cuff Size: Large)   Pulse 82   Temp (!) 97 F (36.1 C)   Resp 20   Ht 5\' 7"  (1.702 m)   Wt 203 lb (92.1 kg)   SpO2 97%   BMI 31.79 kg/m  Body mass index is 31.79 kg/m. Gen: Afebrile. No acute distress.  HENT: AT. [place].MMM.  Eyes:Pupils Equal Round Reactive to light, Extraocular movements intact,   Conjunctiva without redness, discharge or icterus. CV: RRR Chest: CTAB, no wheeze or crackles Skin: no rashes, purpura or petechiae.  Neuro:  Normal gait. PERLA. EOMi. Alert. Oriented.  Psych: Normal affect, dress and demeanor. Normal speech. Normal thought content and judgment.  No exam data present No results found. No results found for  this or any previous visit (from the past 24 hour(s)).  Assessment/Plan: Shilo Philipson is a 72 y.o. 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 Anxiety and insomnia stable with very low-dose Xanax daily at bedtime. Patient has been  counseled on addictive properties of benzodiazepines as well as side effect profile in geriatric population. - Controlled substance contract has been signed. [place] controlled substance database has been reviewed 02/10/18 , appropriate,  made part of her permanent chart. Patient is aware that she will need to be seen every 6 months face-to-face with this provider for refills on this medication. Called into her pharmacy in [place] (spends 6 months a year). They will call to confirm when she picks up script.   Follow-up in 6 months   Reviewed expectations re: course of current medical issues.  Discussed self-management of symptoms.  Outlined signs and symptoms indicating need for more acute intervention.  Patient verbalized understanding and all questions were answered.  Patient received an After-Visit Summary.   > 15 minutes spent with patient, >50% of time spent face to face counseling    No orders of the defined types were placed in this encounter.    Note is dictated utilizing voice recognition software. Although note has been proof read prior to signing, occasional typographical errors still can be missed. If any questions arise, please do not hesitate to call for verification.   electronically signed by:  Howard Pouch, DO  [place]

## 2018-02-10 NOTE — Patient Instructions (Signed)
Have fun in [place]!

## 2018-04-19 ENCOUNTER — Other Ambulatory Visit: Payer: Self-pay | Admitting: Family Medicine

## 2018-04-19 DIAGNOSIS — F411 Generalized anxiety disorder: Secondary | ICD-10-CM

## 2018-04-19 NOTE — Telephone Encounter (Signed)
Patient states that she does have a refill but it is in the [place] and she is coming back to [facility] for some unexpected dental work so she will need Rx to be sent to [place].   Patient understands that Dr Raoul Pitch is out of the office today and will not return until Monday.  Please advise.

## 2018-04-19 NOTE — Telephone Encounter (Signed)
Copied from [place] [id number]. Topic: Quick Communication - Rx Refill/Question >> Apr 19, 2018 10:03 AM Alfredia Ferguson R wrote: Medication: ALPRAZolam Duanne Moron) 0.5 MG tablet  Has the patient contacted their pharmacy? Yes  Preferred Pharmacy (with phone number or street name): [id number] - [place], [place] [phone] (Phone) [phone] (Fax)    Agent: Please be advised that RX refills may take up to 3 business days. We ask that you follow-up with your pharmacy.

## 2018-04-24 NOTE — Telephone Encounter (Signed)
Phone call attempted, left message for patient to return call.

## 2018-04-24 NOTE — Telephone Encounter (Signed)
Med pt is requesting is a controlled substance.  - please pull the [place].  - please ask the patient how long she is going to be here that she needs coverage of med.  - I am willing to provide a limited script- I understand it is odd circumstance, but this is not something that can be done again without a face to face encounter being it is controlled.

## 2018-04-25 MED ORDER — ALPRAZOLAM 0.5 MG PO TABS: 0.5000 mg | ORAL_TABLET | Freq: Every day | ORAL | 0 refills | Status: DC

## 2018-04-25 NOTE — Telephone Encounter (Signed)
Patient called back- she is not sure how long she will be here- she is getting at least 1-2 crowns. She did try to fill the Rx before she left [place]- but it was too soon. She was told by the the pharmacy in [place] if she gets it filled here- it will not be available in [place]. She normally gets a 90 day supply. She is planning on being in town at least 4-6 weeks if not longer. She is willing to come to an appointment if required. She missed call yesterday because she was in the dental office- her best [phone].

## 2018-04-25 NOTE — Telephone Encounter (Signed)
Refilled xanax for her for 90 days. Please make her aware.  Documentation only: This is a replacement script. If needing additional replacement must be seen. [place] was reviewed yesterday prior to refill.

## 2018-04-26 NOTE — Telephone Encounter (Signed)
Detailed message left on voicemail.

## 2018-09-04 ENCOUNTER — Encounter: Payer: Self-pay | Admitting: Family Medicine

## 2018-09-05 ENCOUNTER — Telehealth: Payer: Self-pay | Admitting: Family Medicine

## 2018-09-05 NOTE — Telephone Encounter (Signed)
See E- chart message below.  If patient's last tetanus shot was in 2015- she should have it updated if the bite broke the skin (every 5 yrs if injury). I would suggest a virtual visit if bite did break the skin.  She completes the virtual visit tetanus update here and she can have completed in the office.  If she declines a virtual visit she can have a tetanus updated at a pharmacy, although I do not know what cost would be to her with the pharmacy.    Good morning,    How does the bite look? Is it red, draining, warm to touch? We can offer a virtual visit to you so Dr Raoul Pitch can assess it and make sure you do not need a antibiotic for the bite. We want to ensure it does not get infected as well. Where is the bite located? I have sent Dr Raoul Pitch a message as far as the tetanus vaccine and if that needs to be updated.     Thanks   [name] LPN     ----- Message -----   From: Carol Norton   Sent: 09/04/2018 2:52 PM EDT    To: Howard Pouch, DO  Subject: Non-Urgent Medical Question    Hello,  I was just bitten by my macaw. I had a Tdap in July 2015. Do I need another now?

## 2018-09-05 NOTE — Telephone Encounter (Signed)
Pt sent My Chart message stating she got bit by her bird. Last Tetanus was 2015. Does pt need updated tetanus?   Please advise

## 2018-09-05 NOTE — Telephone Encounter (Signed)
My Chart message was sent to patient offering appt or going to pharmacy to receive tetanus

## 2018-09-11 ENCOUNTER — Encounter: Payer: Self-pay | Admitting: Family Medicine

## 2018-09-29 ENCOUNTER — Other Ambulatory Visit: Payer: Self-pay | Admitting: Family Medicine

## 2018-09-29 DIAGNOSIS — F411 Generalized anxiety disorder: Secondary | ICD-10-CM

## 2018-10-02 NOTE — Telephone Encounter (Signed)
My chart message sent. Pt needs [facility] appt to receive refill

## 2018-10-04 ENCOUNTER — Encounter: Payer: Self-pay | Admitting: Family Medicine

## 2018-10-04 ENCOUNTER — Ambulatory Visit (INDEPENDENT_AMBULATORY_CARE_PROVIDER_SITE_OTHER): Payer: Medicare Other | Admitting: Family Medicine

## 2018-10-04 ENCOUNTER — Other Ambulatory Visit: Payer: Self-pay

## 2018-10-04 VITALS — BP 124/72 | HR 77 | Temp 97.6°F | Ht 67.0 in

## 2018-10-04 DIAGNOSIS — E669 Obesity, unspecified: Secondary | ICD-10-CM

## 2018-10-04 DIAGNOSIS — Z01419 Encounter for gynecological examination (general) (routine) without abnormal findings: Secondary | ICD-10-CM

## 2018-10-04 DIAGNOSIS — F411 Generalized anxiety disorder: Secondary | ICD-10-CM

## 2018-10-04 DIAGNOSIS — T466X5A Adverse effect of antihyperlipidemic and antiarteriosclerotic drugs, initial encounter: Secondary | ICD-10-CM | POA: Insufficient documentation

## 2018-10-04 DIAGNOSIS — R7309 Other abnormal glucose: Secondary | ICD-10-CM

## 2018-10-04 DIAGNOSIS — E039 Hypothyroidism, unspecified: Secondary | ICD-10-CM

## 2018-10-04 DIAGNOSIS — Z79899 Other long term (current) drug therapy: Secondary | ICD-10-CM

## 2018-10-04 DIAGNOSIS — Z1231 Encounter for screening mammogram for malignant neoplasm of breast: Secondary | ICD-10-CM

## 2018-10-04 DIAGNOSIS — Z1159 Encounter for screening for other viral diseases: Secondary | ICD-10-CM

## 2018-10-04 DIAGNOSIS — I6522 Occlusion and stenosis of left carotid artery: Secondary | ICD-10-CM

## 2018-10-04 DIAGNOSIS — H409 Unspecified glaucoma: Secondary | ICD-10-CM

## 2018-10-04 DIAGNOSIS — E2839 Other primary ovarian failure: Secondary | ICD-10-CM

## 2018-10-04 DIAGNOSIS — Z789 Other specified health status: Secondary | ICD-10-CM | POA: Insufficient documentation

## 2018-10-04 DIAGNOSIS — Z1211 Encounter for screening for malignant neoplasm of colon: Secondary | ICD-10-CM

## 2018-10-04 DIAGNOSIS — E559 Vitamin D deficiency, unspecified: Secondary | ICD-10-CM

## 2018-10-04 DIAGNOSIS — F1310 Sedative, hypnotic or anxiolytic abuse, uncomplicated: Secondary | ICD-10-CM

## 2018-10-04 DIAGNOSIS — Z0000 Encounter for general adult medical examination without abnormal findings: Secondary | ICD-10-CM | POA: Diagnosis not present

## 2018-10-04 DIAGNOSIS — E782 Mixed hyperlipidemia: Secondary | ICD-10-CM

## 2018-10-04 MED ORDER — ALPRAZOLAM 0.5 MG PO TABS: 0.5000 mg | ORAL_TABLET | Freq: Every day | ORAL | 1 refills | Status: DC

## 2018-10-04 NOTE — Progress Notes (Signed)
Medicare AWV and chronic medical conditions follow up Chief Complaint  Patient presents with  . Anxiety    No complaints and needs refills on medications     Patient Care Team    Relationship Specialty Notifications Start End  [name], DO PCP - General Family Medicine  05/26/16   [name], MD Consulting Physician Orthopedic Surgery  03/11/16   [name], NP Nurse Practitioner Nurse Practitioner  05/26/16   [name], MD Consulting Physician Internal Medicine  05/26/16    Comment: endocrine, thyroid  [name], [name], MD Referring Physician Pulmonary Disease  05/26/16   [name], MD Consulting Physician Neurology  05/26/16   [name], MD Consulting Physician Cardiology  05/26/16   [name], MD Consulting Physician Ophthalmology  05/26/16    Comment: Dr Kathlen Mody at [facility] for glaucoma and cataracts every 3 mos  [name], MD Attending Physician Family Medicine  05/26/16    Comment: [facility]     History of Present Ilness: Carol Norton, 73 y.o. , female presents today for Medicare wellness visit anf followup on chronic conditions.   VIRTUAL VISIT VIA VIDEO  I connected with Carol Norton on 10/04/18 at 10:00 AM EDT by a video enabled telemedicine application and verified that I am speaking with the correct person using two identifiers. Location patient: Home Location provider: [facility], Office Persons participating in the virtual visit: Patient, Dr. Raoul Pitch and R.Baker, LPN  I discussed the limitations of evaluation and management by telemedicine and the availability of in person appointments. The patient expressed understanding and agreed to proceed.   HPI:  Anxiety: Patient is prescribed Xanax 0.5 mg daily at bedtime.  Patient has been counseled on the addictive properties of benzodiazepines. She has been tried on other medications with side effects. She reports medication helps her greatly and I sin need refills today.   Indication for chronic benzo: anxiety Medication and dose: xanax 0.5 mg  # pills per month: #90 Last UDS date: Due next visit contract signed (Y/N): Y Date narcotic database last reviewed (include red flags): 10/04/18   Health maintenance:  Colonoscopy: Pt adamantly declines, agreeable to cologuard.  Mammogram (50-74): pt agreeable to mammogram.  Cervical cancer screening(<65): N/A- pt would like to be referred to gyn for pelvi exam--> referral placed.  Immunizations: Pneumovax 23 due.  Declines up-to-date 2019, Tdap up-to-date 2028, Shingrix series completed, Prevnar completed 2015. Infectious disease screening: Hep c completed today Glaucoma screen: has every 6 mos appt with Dr. Herbert Deaner for Glaucoma condition.  Hearing: Whisper test, no barriers identified.   Cognitive assessment:  No barriers identified. BP 124/72   Pulse 77   Temp 97.6 F (36.4 C) (Oral)   Ht '5\' 7"'  (1.702 m)   BMI 31.79 kg/m  Gen: Afebrile. No acute distress.  Nontoxic in appearance.  Pleasant, obese Caucasian female. HENT: AT. [place]. MMM. Eyes:Pupils Equal Round Reactive to light, Extraocular movements intact,  Conjunctiva without redness, discharge or icterus. CV: Regular rate, no edema Chest: No cough or shortness of breath present Skin: no rashes, purpura or petechiae.  Neuro:  Normal gait.  Alert. Oriented x3  Psych: Normal affect, dress and demeanor. Normal speech. Normal thought content and judgment..   Past medical, surgical, family and social histories reviewed (including experiences with illnesses, hospital stays, operations, injuries, and treatments):  Past Medical History:  Diagnosis Date  . Allergic rhinitis   . Allergy   . Anxiety   .  Bilateral carpal tunnel syndrome    Patient scheduled for surgery with neurology  . Cataract   . Chronic constipation   . Chronic headache   . Chronic sinusitis   . CN (constipation) 12/17/2014  . GERD (gastroesophageal reflux disease)   . Glaucoma   .  Hyperlipemia   . Hypothyroid   . Leg length discrepancy   . Ocular rosacea   . Plantar fasciitis   . Sebaceous cyst   . Sleep apnea    on CPAP  . Urethral stenosis   . Vitamin D deficiency    All allergies reviewed Allergies  Allergen Reactions  . Synthroid [Levothyroxine Sodium] Shortness Of Breath  . Accolate [Zafirlukast]   . Avelox [Moxifloxacin Hcl In Nacl]   . Citalopram   . Crestor [Rosuvastatin]   . Dexilant [Dexlansoprazole]   . Escitalopram Oxalate   . Nsaids     Hair loss  . Singulair [Montelukast Sodium]     Aggression,anger  . Statins     Myalgias, joint pain.  . Wellbutrin [Bupropion]   . Dymista [Azelastine-Fluticasone]   . Simvastatin Palpitations   Past Surgical History:  Procedure Laterality Date  . ABDOMINAL HYSTERECTOMY  1983  . BREAST SURGERY     sebaceous cyst  . EYE SURGERY     surgery with laser  . FACIAL COSMETIC SURGERY     1995  . GANGLION CYST EXCISION    . MENISCUS REPAIR Left   . RHINOPLASTY    . SEPTOPLASTY    . TUBAL LIGATION     Family History  Problem Relation Age of Onset  . Emphysema Father   . Alcohol abuse Father   . Allergies Mother   . Heart disease Mother   . Ovarian cancer Mother   . Hyperlipidemia Mother   . Hypothyroidism Mother   . Arthritis Mother   . Hypothyroidism Sister   . Lung cancer Paternal Aunt   . Diabetes Maternal Grandmother   . Heart disease Maternal Grandmother   . Stroke Paternal Grandfather    Social History   Social History Narrative   Married to Carol Norton. 2 children.   Dinks caffeine.   Patient is right handed.    Exercises routinely.   Wears seatbelt, smoke detector at home.   Feels safe in relationship.    All medications verified Allergies as of 10/04/2018      Reactions   Synthroid [levothyroxine Sodium] Shortness Of Breath   Accolate [zafirlukast]    Avelox [moxifloxacin Hcl In Nacl]    Citalopram    Crestor [rosuvastatin]    Dexilant [dexlansoprazole]    Escitalopram  Oxalate    Nsaids    Hair loss   Singulair [montelukast Sodium]    Aggression,anger   Statins    Myalgias, joint pain.   Wellbutrin [bupropion]    Dymista [azelastine-fluticasone]    Simvastatin Palpitations      Medication List       Accurate as of Oct 04, 2018 12:44 PM. If you have any questions, ask your nurse or doctor.        STOP taking these medications   erythromycin ophthalmic ointment Stopped by:  Howard Pouch, DO   omeprazole 40 MG capsule Commonly known as:  PRILOSEC Stopped by:  Howard Pouch, DO   OVER THE COUNTER MEDICATION Stopped by:  Howard Pouch, DO   Vitamin D (Ergocalciferol) 1.25 MG (50000 UT) Caps capsule Commonly known as:  DRISDOL Stopped by:  Howard Pouch, DO     TAKE  these medications   ALIGN PO Take by mouth daily.   ALPRAZolam 0.5 MG tablet Commonly known as:  XANAX Take 1 tablet (0.5 mg total) by mouth at bedtime.   [place] Thyroid 60 MG tablet Generic drug:  thyroid Take one tablet daily   diclofenac sodium 1 % Gel Commonly known as:  VOLTAREN diclofenac 1 % topical gel  APPLY 2 GRAM TO THE AFFECTED AREA(S) BY TOPICAL ROUTE 2-3 TIMES PER DAY   doxycycline 100 MG capsule Commonly known as:  MONODOX Take 100 mg by mouth daily.   Magnesium 100 MG Caps Take 800 mg by mouth.   methocarbamol 500 MG tablet Commonly known as:  ROBAXIN Take 500 mg by mouth 3 (three) times daily as needed.   Olopatadine HCl 0.6 % Soln Place 2 sprays into both nostrils 2 (two) times daily.   traMADol 50 MG tablet Commonly known as:  ULTRAM tramadol 50 mg tablet  TAKE 1 TABLET BY MOUTH EVERY 6 HOURS AS NEEDED   TUMS PO Take by mouth every morning.   Tylenol 8 Hour Arthritis Pain 650 MG CR tablet Generic drug:  acetaminophen Take 650 mg by mouth every 8 (eight) hours as needed for pain.   vitamin B-6 250 MG tablet Take 750 mg by mouth daily.   Vitamin D 125 MCG (5000 UT) Caps Take 2 capsules by mouth daily.       Depression screen  [facility] 2/9 10/04/2018 10/12/2017 09/23/2016 05/26/2016  Decreased Interest 0 0 0 0  Down, Depressed, Hopeless 1 0 0 0  PHQ - 2 Score 1 0 0 0  Altered sleeping 0 - - -  Tired, decreased energy 0 - - -  Change in appetite 0 - - -  Feeling bad or failure about yourself  0 - - -  Trouble concentrating 0 - - -  Moving slowly or fidgety/restless 0 - - -  Suicidal thoughts 0 - - -  PHQ-9 Score 1 - - -  Difficult doing work/chores Not difficult at all - - -   GAD 7 : Generalized Anxiety Score 10/04/2018 01/11/2017 09/23/2016 05/13/2016  Nervous, Anxious, on Edge 0 '1 2 1  ' Control/stop worrying 0 '2 2 2  ' Worry too much - different things '1 1 2 2  ' Trouble relaxing 0 '1 1 1  ' Restless 0 '1 1 1  ' Easily annoyed or irritable 0 0 0 0  Afraid - awful might happen 1 0 1 1  Total GAD 7 Score '2 6 9 8  ' Anxiety Difficulty Not difficult at all Somewhat difficult Somewhat difficult Somewhat difficult    Immunizations: Immunization History  Administered Date(s) Administered  . Influenza Split 02/27/2014  . Influenza, High Dose Seasonal PF 02/23/2017, 02/09/2018, 03/12/2018  . Influenza,inj,quad, With Preservative 02/23/2017  . Influenza-Unspecified 02/06/2015, 02/19/2015, 02/22/2016, 02/24/2017  . Pneumococcal Conjugate-13 12/12/2013  . Pneumococcal Polysaccharide-23 05/24/2008  . Tdap 12/12/2013, 09/07/2018  . Zoster 02/12/2008  . Zoster Recombinat (Shingrix) 02/23/2017, 08/25/2017    Exercise: Current Exercise Habits: Home exercise routine, Type of exercise: walking;Other - see comments, Time (Minutes): 30, Frequency (Times/Week): 5, Weekly Exercise (Minutes/Week): 150, Intensity: Moderate    Diet: Regular  Functional Status Survey: Get up and go test: steady and less than 20 seconds.  Current Exercise Habits: Home exercise routine, Type of exercise: walking;Other - see comments, Time (Minutes): 30, Frequency (Times/Week): 5, Weekly Exercise (Minutes/Week): 150, Intensity: Moderate   Fall Risk  10/04/2018  10/12/2017 05/26/2016  Falls in the past year? 0 Yes  No  Number falls in past yr: 0 2 or more -  Injury with Fall? 0 Yes -  Risk Factor Category  - High Fall Risk -  Follow up Falls evaluation completed Falls evaluation completed;Education provided;Falls prevention discussed -  Comment - slip injuries -    Advanced Care Planning: [place] information provided: yes  Cardiovascular Screening Blood Tests Lipid Panel  No results found for: CHOL, TRIG, HDL, CHOLHDL, VLDL, LDLCALC, LDLDIRECT  Diabetes Screening Tests Lab Results  Component Value Date   HGBA1C 5.9 (H) 12/23/2015    Assessment and Plan: Ellinore Merced is a 73 y.o. female present for medicare annual wellness and [facility] followup Medicare annual wellness visit, subsequent Patient was encouraged to exercise greater than 150 minutes a week. Patient was encouraged to choose a diet filled with fresh fruits and vegetables, and lean meats. AVS provided to patient today for education/recommendation on gender specific health and safety maintenance. Colonoscopy: Pt adamantly declines, agreeable to cologuard.  Mammogram (50-74): pt agreeable to mammogram.  Cervical cancer screening(<65): N/A- pt would like to be referred to gyn for pelvi exam--> referral placed.  Immunizations: Pneumovax 23 due.  Declines up-to-date 2019, Tdap up-to-date 2028, Shingrix series completed, Prevnar completed 2015. Infectious disease screening: Hep c completed today Glaucoma screen: has every 6 mos appt with Dr. Herbert Deaner for Glaucoma condition.  Hearing: Whisper test, no barriers identified.  Acquired hypothyroidism - follows with Dr. Cruzita Lederer- will forward labs to her. If pt desires [place] to take over management will happy to do so- does not appear to be a complicated case of hypothyroidism.  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  Dispense: 90 tablet; Refill: 1 - f/u q 6 mos Glaucoma, unspecified glaucoma type, unspecified laterality Follows routinely with Dr. Herbert Deaner Mixed hyperlipidemia/ Obesity (BMI 30.0-34.9)/Carotid atherosclerosis, left/Statin intolerance - intolerant to all statins.  - diet modification and routine exercise encouraged.  - CBC w/Diff; Future - Lipid panel; Future Vitamin D deficiency/Estrogen deficiency - taking supplement> continue. Will collect labs today to ensure levels are adequate.  - DG Bone Density; Future >>> no record of prior DEXA Colon cancer screening - adamant against colonoscopy--> willing to complete cologuard if covered.  - Cologuard; Future Breast cancer screening by mammogram - MM 3D SCREEN BREAST BILATERAL; Future Encounter for hepatitis C screening test for low risk patient - pt agreeable to test - Hepatitis C Antibody; Future Elevated glucose - Comp Met (CMET); Future - Hemoglobin A1c; Future Encounter for long-term current use of medication - Comp Met (CMET); Future  Medicare wellness/CPE 1 year [facility] q 6 mos.  Patient will have nurse visit within 1 week for laboratory collection and Pneumovax 23 administration.  Medicare wellness visit completed and > 15 minutes spent on anxiety- > 50% of that time face to face.   Electronically Signed by: Howard Pouch, DO [place]

## 2018-10-04 NOTE — Patient Instructions (Signed)
It was great to see you today. I have refilled your medication requested. Follow up before leaving for [place].   Thank you for taking time to cover your Medicare Wellness Visit. I appreciate your ongoing commitment to your health goals. Please review the following plan we discussed and let me know if I can assist you in the future.   Goals    . Exercise 150 min/wk Moderate Activity       This is a list of the screening recommended for you and due dates:  - Colon cancer screening-ordered Cologuard, they will be in touch to set up sending you the kit.  -Mammogram was ordered for you today to be completed before leaving for [place].  They will call you to set up your mammogram and your bone density scan on the same day at the breast center.  -I did referred you to gynecology per your wishes.  They should be calling you to set this up as well.  I did find the name of the person you had seen in the past and referred to them.  - Immunizations: Pneumovax 23 due--> will have completed at your nurse visit with your labs. All other immunizations up to date.  - Remember to receive your flu shot in September.   -Hep c screening  completed - Continue routine follows with your eye doctor.   If you have a living will or health care power of attorney papers completed at home please send [place] a copy.   If you do not have those forms- I have enclosed a sample form for you to review.   We will call you with all lab and imaging results after we receive them.   Health Maintenance After Age 46 After age 14, you are at a higher risk for certain long-term diseases and infections as well as injuries from falls. Falls are a major cause of broken bones and head injuries in people who are older than age 54. Getting regular preventive care can help to keep you healthy and well. Preventive care includes getting regular testing and making lifestyle changes as recommended by your health care provider. Talk with your  health care provider about:  Which screenings and tests you should have. A screening is a test that checks for a disease when you have no symptoms.  A diet and exercise plan that is right for you. What should I know about screenings and tests to prevent falls? Screening and testing are the best ways to find a health problem early. Early diagnosis and treatment give you the best chance of managing medical conditions that are common after age 15. Certain conditions and lifestyle choices may make you more likely to have a fall. Your health care provider may recommend:  Regular vision checks. Poor vision and conditions such as cataracts can make you more likely to have a fall. If you wear glasses, make sure to get your prescription updated if your vision changes.  Medicine review. Work with your health care provider to regularly review all of the medicines you are taking, including over-the-counter medicines. Ask your health care provider about any side effects that may make you more likely to have a fall. Tell your health care provider if any medicines that you take make you feel dizzy or sleepy.  Osteoporosis screening. Osteoporosis is a condition that causes the bones to get weaker. This can make the bones weak and cause them to break more easily.  Blood pressure screening. Blood pressure changes  and medicines to control blood pressure can make you feel dizzy.  Strength and balance checks. Your health care provider may recommend certain tests to check your strength and balance while standing, walking, or changing positions.  Foot health exam. Foot pain and numbness, as well as not wearing proper footwear, can make you more likely to have a fall.  Depression screening. You may be more likely to have a fall if you have a fear of falling, feel emotionally low, or feel unable to do activities that you used to do.  Alcohol use screening. Using too much alcohol can affect your balance and may make you  more likely to have a fall. What actions can I take to lower my risk of falls? General instructions  Talk with your health care provider about your risks for falling. Tell your health care provider if: ? You fall. Be sure to tell your health care provider about all falls, even ones that seem minor. ? You feel dizzy, sleepy, or off-balance.  Take over-the-counter and prescription medicines only as told by your health care provider. These include any supplements.  Eat a healthy diet and maintain a healthy weight. A healthy diet includes low-fat dairy products, low-fat (lean) meats, and fiber from whole grains, beans, and lots of fruits and vegetables. Home safety  Remove any tripping hazards, such as rugs, cords, and clutter.  Install safety equipment such as grab bars in bathrooms and safety rails on stairs.  Keep rooms and walkways well-lit. Activity   Follow a regular exercise program to stay fit. This will help you maintain your balance. Ask your health care provider what types of exercise are appropriate for you.  If you need a cane or walker, use it as recommended by your health care provider.  Wear supportive shoes that have nonskid soles. Lifestyle  Do not drink alcohol if your health care provider tells you not to drink.  If you drink alcohol, limit how much you have: ? 0-1 drink a day for women. ? 0-2 drinks a day for men.  Be aware of how much alcohol is in your drink. In the [place] one drink equals one typical bottle of beer (12 oz), one-half glass of wine (5 oz), or one shot of hard liquor (1 oz).  Do not use any products that contain nicotine or tobacco, such as cigarettes and e-cigarettes. If you need help quitting, ask your health care provider. Summary  Having a healthy lifestyle and getting preventive care can help to protect your health and wellness after age 51.  Screening and testing are the best way to find a health problem early and help you avoid having  a fall. Early diagnosis and treatment give you the best chance for managing medical conditions that are more common for people who are older than age 54.  Falls are a major cause of broken bones and head injuries in people who are older than age 40. Take precautions to prevent a fall at home.  Work with your health care provider to learn what changes you can make to improve your health and wellness and to prevent falls. This information is not intended to replace advice given to you by your health care provider. Make sure you discuss any questions you have with your health care provider. Document Released: 03/23/2017 Document Revised: 03/23/2017 Document Reviewed: 03/23/2017 Elsevier Interactive Patient Education  2019 [employer].

## 2018-10-06 ENCOUNTER — Other Ambulatory Visit: Payer: Medicare Other

## 2018-10-06 ENCOUNTER — Ambulatory Visit (INDEPENDENT_AMBULATORY_CARE_PROVIDER_SITE_OTHER): Payer: Medicare Other

## 2018-10-06 ENCOUNTER — Other Ambulatory Visit: Payer: Self-pay

## 2018-10-06 DIAGNOSIS — E782 Mixed hyperlipidemia: Secondary | ICD-10-CM | POA: Diagnosis not present

## 2018-10-06 DIAGNOSIS — Z1159 Encounter for screening for other viral diseases: Secondary | ICD-10-CM

## 2018-10-06 DIAGNOSIS — Z79899 Other long term (current) drug therapy: Secondary | ICD-10-CM

## 2018-10-06 DIAGNOSIS — E039 Hypothyroidism, unspecified: Secondary | ICD-10-CM

## 2018-10-06 DIAGNOSIS — Z23 Encounter for immunization: Secondary | ICD-10-CM

## 2018-10-06 DIAGNOSIS — E559 Vitamin D deficiency, unspecified: Secondary | ICD-10-CM | POA: Diagnosis not present

## 2018-10-06 DIAGNOSIS — R7309 Other abnormal glucose: Secondary | ICD-10-CM

## 2018-10-06 LAB — VITAMIN D 25 HYDROXY (VIT D DEFICIENCY, FRACTURES): VITD: 40.74 ng/mL (ref 30.00–100.00)

## 2018-10-06 LAB — CBC WITH DIFFERENTIAL/PLATELET
Basophils Absolute: 0 10*3/uL (ref 0.0–0.1)
Basophils Relative: 0.6 % (ref 0.0–3.0)
Eosinophils Absolute: 0.4 10*3/uL (ref 0.0–0.7)
Eosinophils Relative: 5.4 % — ABNORMAL HIGH (ref 0.0–5.0)
HCT: 38.8 % (ref 36.0–46.0)
Hemoglobin: 13.4 g/dL (ref 12.0–15.0)
Lymphocytes Relative: 47.7 % — ABNORMAL HIGH (ref 12.0–46.0)
Lymphs Abs: 3.4 10*3/uL (ref 0.7–4.0)
MCHC: 34.6 g/dL (ref 30.0–36.0)
MCV: 92.2 fl (ref 78.0–100.0)
Monocytes Absolute: 0.7 10*3/uL (ref 0.1–1.0)
Monocytes Relative: 9.9 % (ref 3.0–12.0)
Neutro Abs: 2.6 10*3/uL (ref 1.4–7.7)
Neutrophils Relative %: 36.4 % — ABNORMAL LOW (ref 43.0–77.0)
Platelets: 227 10*3/uL (ref 150.0–400.0)
RBC: 4.21 Mil/uL (ref 3.87–5.11)
RDW: 14.6 % (ref 11.5–15.5)
WBC: 7 10*3/uL (ref 4.0–10.5)

## 2018-10-06 LAB — COMPREHENSIVE METABOLIC PANEL
ALT: 18 U/L (ref 0–35)
AST: 20 U/L (ref 0–37)
Albumin: 4 g/dL (ref 3.5–5.2)
Alkaline Phosphatase: 90 U/L (ref 39–117)
BUN: 17 mg/dL (ref 6–23)
CO2: 27 mEq/L (ref 19–32)
Calcium: 9 mg/dL (ref 8.4–10.5)
Chloride: 104 mEq/L (ref 96–112)
Creatinine, Ser: 0.98 mg/dL (ref 0.40–1.20)
GFR: 55.75 mL/min — ABNORMAL LOW (ref >60.00)
Glucose, Bld: 98 mg/dL (ref 70–99)
Potassium: 4.3 mEq/L (ref 3.5–5.1)
Sodium: 141 mEq/L (ref 135–145)
Total Bilirubin: 0.4 mg/dL (ref 0.2–1.2)
Total Protein: 6.5 g/dL (ref 6.0–8.3)

## 2018-10-06 LAB — LIPID PANEL
Cholesterol: 250 mg/dL — ABNORMAL HIGH (ref 0–200)
HDL: 69.6 mg/dL (ref >39.00)
LDL Cholesterol: 160 mg/dL — ABNORMAL HIGH (ref 0–99)
NonHDL: 180.06
Total CHOL/HDL Ratio: 4
Triglycerides: 101 mg/dL (ref 0.0–149.0)
VLDL: 20.2 mg/dL (ref 0.0–40.0)

## 2018-10-06 LAB — HEMOGLOBIN A1C: Hgb A1c MFr Bld: 6.3 % (ref 4.6–6.5)

## 2018-10-06 LAB — T3, FREE: T3, Free: 3.7 pg/mL (ref 2.3–4.2)

## 2018-10-06 LAB — TSH: TSH: 1.2 u[IU]/mL (ref 0.35–4.50)

## 2018-10-06 LAB — T4, FREE: Free T4: 0.58 ng/dL — ABNORMAL LOW (ref 0.60–1.60)

## 2018-10-06 NOTE — Progress Notes (Signed)
Pt in for Pnuemovax 23 injection.  Pneumococcal Vaccine administered in left deltoid. Pt tolerated injection well.

## 2018-10-09 ENCOUNTER — Other Ambulatory Visit: Payer: Medicare Other

## 2018-10-09 ENCOUNTER — Telehealth: Payer: Self-pay | Admitting: Family Medicine

## 2018-10-09 LAB — HEPATITIS C ANTIBODY
Hepatitis C Ab: NONREACTIVE
SIGNAL TO CUT-OFF: 0.01 (ref <1.00)

## 2018-10-09 MED ORDER — ARMOUR THYROID 60 MG PO TABS: ORAL_TABLET | ORAL | 3 refills | Status: DC

## 2018-10-09 NOTE — Telephone Encounter (Signed)
Please inform patient the following information: Labs are stable overall. - Kidney function is very mildly decreased, nothing concerning. Control of BP and sugar will help keep stable.  - vit d looks great at 36. - thyroid panel is good- TSH and T3 free normal. Her T4 free is 0.58 (0.60 normal)... that level has been the same for years and stable for her.  - Cholesterol is high at 160 LDL (Goal < 130) and 250  for total cholesterol (goal <200). She is not tolerant to any statins. There is another med called zeita than can help lower LDL that is not a statin and their is an injectable med (every 2 weeks) that is also available now that can help lower cholesterol. If she is agreeable to either please advise.    - routine exercise, higher fiber diet and lower saturated fats will also help.

## 2018-10-09 NOTE — Telephone Encounter (Signed)
Pt is not agreeable to medication right now but will think about it and call back if she decides to  Take the medication. Pt verbalizes understanding on lab results

## 2018-11-06 LAB — HM DIABETES EYE EXAM

## 2018-11-28 ENCOUNTER — Encounter: Payer: Self-pay | Admitting: Family Medicine

## 2018-12-01 ENCOUNTER — Telehealth: Payer: Self-pay | Admitting: *Deleted

## 2018-12-01 ENCOUNTER — Telehealth: Payer: Self-pay

## 2018-12-01 ENCOUNTER — Encounter: Payer: Self-pay | Admitting: Family Medicine

## 2018-12-01 ENCOUNTER — Ambulatory Visit (INDEPENDENT_AMBULATORY_CARE_PROVIDER_SITE_OTHER): Payer: Medicare Other | Admitting: Family Medicine

## 2018-12-01 ENCOUNTER — Other Ambulatory Visit: Payer: Self-pay

## 2018-12-01 VITALS — Temp 97.5°F | Ht 67.0 in

## 2018-12-01 DIAGNOSIS — J0100 Acute maxillary sinusitis, unspecified: Secondary | ICD-10-CM | POA: Diagnosis not present

## 2018-12-01 DIAGNOSIS — R42 Dizziness and giddiness: Secondary | ICD-10-CM

## 2018-12-01 DIAGNOSIS — R0981 Nasal congestion: Secondary | ICD-10-CM | POA: Diagnosis not present

## 2018-12-01 DIAGNOSIS — Z7189 Other specified counseling: Secondary | ICD-10-CM

## 2018-12-01 DIAGNOSIS — R51 Headache: Secondary | ICD-10-CM

## 2018-12-01 DIAGNOSIS — Z20822 Contact with and (suspected) exposure to covid-19: Secondary | ICD-10-CM

## 2018-12-01 DIAGNOSIS — R519 Headache, unspecified: Secondary | ICD-10-CM

## 2018-12-01 MED ORDER — AMOXICILLIN-POT CLAVULANATE 875-125 MG PO TABS: 1.0000 | ORAL_TABLET | Freq: Two times a day (BID) | ORAL | 0 refills | Status: DC

## 2018-12-01 NOTE — Telephone Encounter (Signed)
-----   Message from Gerilyn Nestle, RN sent at 12/01/2018 12:09 PM EDT ----- Regarding: [place].   Carol Norton MRN [id number]  Symptoms: Nasal congestion; dizzy; lightheaded.  [facility], [place] #[id number]

## 2018-12-01 NOTE — Progress Notes (Signed)
VIRTUAL VISIT VIA VIDEO  I connected with Carol Norton on 12/01/18 at 11:45 AM EDT by a video enabled telemedicine application and verified that I am speaking with the correct person using two identifiers. Location patient: Home Location provider: [facility], Office Persons participating in the virtual visit: Patient, Dr. Raoul Pitch and R.Baker, LPN  I discussed the limitations of evaluation and management by telemedicine and the availability of in person appointments. The patient expressed understanding and agreed to proceed.   SUBJECTIVE Chief Complaint  Patient presents with  . Facial Pain    Pt has had facial pain, headaches, nasal congestion, dizziness, x10 days. Denies fever and SOB.   Marland Kitchen Headache  . Dizziness    HPI: Carol Norton is a 72 y.o. female present for 7-10 days of upper respiratory illness.  Patient reports she has had headache, nasal congestion, dizziness and facial pain in her bilateral maxillary sinus area.  She has had chronic sinus infections in the past.  She denies any fever, chills, cough or shortness of breath.  She denies any GI symptoms.  She has mostly been staying in the home.  However she has been outside the home for grocery shopping and over the last 2 weeks she has attended 2 other types of doctors appointments in which she had a laser procedure done for her rosacea and injections of her trigger points of her neck.  She denies any known COVID-19 exposure.  ROS: See pertinent positives and negatives per HPI.  Patient Active Problem List   Diagnosis Date Noted  . Statin intolerance 10/04/2018  . Benzodiazepine use agreement exists 02/10/2018  . Carotid atherosclerosis, left 10/14/2017  . Disorder of both mastoids 10/12/2017  . Gastroesophageal reflux disease 01/11/2017  . 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 Generalized anxiety disorder 05/13/2016  . Obesity (BMI 30.0-34.9) 05/13/2016  . Mixed hyperlipidemia 05/13/2016  . Arthritis,  senescent 02/25/2015  . Vitamin D deficiency 02/25/2015  . Foraminal stenosis of cervical region 02/20/2015  . Acquired hypothyroidism 12/17/2014  . Glaucoma 12/17/2014  . Chronic sinusitis 12/17/2014  . Ocular rosacea 12/17/2014  . OSA (obstructive sleep apnea) 10/06/2012    Social History   Tobacco Use  . Smoking status: Former Smoker    Packs/day: 0.75    Years: 25.00    Pack years: 18.75    Types: Cigarettes    Quit date: 05/25/2007    Years since quitting: 11.5  . Smokeless tobacco: Never Used  . Tobacco comment: quit for 10 years in the total 59yrs.   Substance Use Topics  . Alcohol use: No    Comment: rare    Current Outpatient Medications:  .  acetaminophen (TYLENOL 8 HOUR ARTHRITIS PAIN) 650 MG CR tablet, Take 650 mg by mouth every 8 (eight) hours as needed for pain., Disp: , Rfl:  .  ALPRAZolam (XANAX) 0.5 MG tablet, Take 1 tablet (0.5 mg total) by mouth at bedtime., Disp: 90 tablet, Rfl: 1 .  ARMOUR THYROID 60 MG tablet, Take one tablet daily, Disp: 90 tablet, Rfl: 3 .  Calcium Carbonate Antacid (TUMS PO), Take by mouth every morning., Disp: , Rfl:  .  Cholecalciferol (VITAMIN D) 125 MCG (5000 UT) CAPS, Take 2 capsules by mouth daily., Disp: , Rfl:  .  diclofenac sodium (VOLTAREN) 1 % GEL, diclofenac 1 % topical gel  APPLY 2 GRAM TO THE AFFECTED AREA(S) BY TOPICAL ROUTE 2-3 TIMES PER DAY, Disp: , Rfl:  .  doxycycline (MONODOX) 100 MG capsule,  Take 100 mg by mouth daily., Disp: , Rfl:  .  Magnesium 100 MG CAPS, Take 800 mg by mouth. , Disp: , Rfl:  .  methocarbamol (ROBAXIN) 500 MG tablet, Take 500 mg by mouth 3 (three) times daily as needed., Disp: , Rfl:  .  Olopatadine HCl 0.6 % SOLN, Place 2 sprays into both nostrils 2 (two) times daily., Disp: 1 Bottle, Rfl: 5 .  Pyridoxine HCl (VITAMIN B-6) 250 MG tablet, Take 750 mg by mouth daily., Disp: , Rfl:  .  traMADol (ULTRAM) 50 MG tablet, tramadol 50 mg tablet  TAKE 1 TABLET BY MOUTH EVERY 6 HOURS AS NEEDED, Disp: , Rfl:   .  Probiotic Product (ALIGN PO), Take by mouth daily., Disp: , Rfl:   Allergies  Allergen Reactions  . Synthroid [Levothyroxine Sodium] Shortness Of Breath  . Accolate [Zafirlukast]   . Avelox [Moxifloxacin Hcl In Nacl]   . Citalopram   . Crestor [Rosuvastatin]   . Dexilant [Dexlansoprazole]   . Escitalopram Oxalate   . Nsaids     Hair loss  . Singulair [Montelukast Sodium]     Aggression,anger  . Statins     Myalgias, joint pain.  . Wellbutrin [Bupropion]   . Dymista [Azelastine-Fluticasone]   . Simvastatin Palpitations    OBJECTIVE: Temp (!) 97.5 F (36.4 C) (Oral)   Ht 5\' 7"  (1.702 m)   BMI 31.79 kg/m  Gen: No acute distress. Nontoxic in appearance. Appears fatigued. Sounds nasal congested HENT: AT. .  MMM.  Eyes:Pupils Equal Round Reactive to light, Extraocular movements intact,  Conjunctiva without redness, discharge or icterus. CV: no edema Chest: Cough or shortness of breath not present  Skin: no rashes, purpura or petechiae.  Neuro:  Normal gait. Alert. Oriented x3  Psych: Normal affect, dress and demeanor. Normal speech. Normal thought content and judgment.  ASSESSMENT AND PLAN: Carol Norton is a 73 y.o. female present for  Acute maxillary sinusitis, recurrence not specified/ Nonintractable headache, unspecified chronicity pattern, unspecified headache type/Nasal congestion/Dizziness Rest, hydrate.  covid test ordered. Covid education provided.  + flonase, mucinex (DM if cough), nettie pot or nasal saline.  Stop sudafed. augmentin prescribed, take until completed.  F/U 2 weeks of not improved or sooner  if COVID test positive or worsening.   > 15 minutes spent with patient, > 50% of that time face to face    Howard Pouch, DO 12/01/2018

## 2018-12-01 NOTE — Telephone Encounter (Signed)
Copied from [place] [phone] [id number]. Topic: Appointment Scheduling - Scheduling Inquiry for Clinic >> Dec 01, 2018  8:24 AM Edmonia Caprio wrote: Reason for CRM:  Patient is complaining of sinus issues x 1 week. States when she bends over she gets dizzy/light headed. Denies any fever.  No cough. No sore throat.  I offered 4:15 virtual appt, she didn't decline. But she is requesting to be seen in office today, if possible. Can we work in as an acute visit? Virtual visit at 4:15? Or see if [facility] has openings? Please advise.  Called pt and she is having tightness in face and head, esp around the nose. Pt has headache. Taking IBU and sudafed. Balance is off. Pt was told she would need to make a VV. VV scheduled at 1145.

## 2018-12-01 NOTE — Telephone Encounter (Signed)
Scheduled patient for COVID 19 test on 12/04/2018 at 10 am.  Testing protocol reviewed with patient.

## 2018-12-01 NOTE — Patient Instructions (Signed)

## 2018-12-04 ENCOUNTER — Other Ambulatory Visit: Payer: Self-pay | Admitting: Family Medicine

## 2018-12-04 ENCOUNTER — Other Ambulatory Visit: Payer: Medicare Other

## 2018-12-04 DIAGNOSIS — Z20822 Contact with and (suspected) exposure to covid-19: Secondary | ICD-10-CM

## 2018-12-08 LAB — NOVEL CORONAVIRUS, NAA: SARS-CoV-2, NAA: NOT DETECTED

## 2019-01-22 ENCOUNTER — Ambulatory Visit (INDEPENDENT_AMBULATORY_CARE_PROVIDER_SITE_OTHER): Payer: Medicare Other | Admitting: Family Medicine

## 2019-01-22 ENCOUNTER — Encounter: Payer: Self-pay | Admitting: Family Medicine

## 2019-01-22 ENCOUNTER — Other Ambulatory Visit: Payer: Self-pay

## 2019-01-22 VITALS — BP 127/68 | HR 67 | Temp 97.4°F | Ht 67.0 in

## 2019-01-22 DIAGNOSIS — B001 Herpesviral vesicular dermatitis: Secondary | ICD-10-CM

## 2019-01-22 MED ORDER — VALACYCLOVIR HCL 1 G PO TABS: ORAL_TABLET | ORAL | 0 refills | Status: DC

## 2019-01-22 NOTE — Progress Notes (Signed)
VIRTUAL VISIT VIA VIDEO  I connected with Tiffany Kocher on 01/22/19 at  4:00 PM EDT by a video enabled telemedicine application and verified that I am speaking with the correct person using two identifiers. Location patient: Home Location provider: [facility], Office Persons participating in the virtual visit: Patient, Dr. Raoul Pitch and R.Baker, LPN  I discussed the limitations of evaluation and management by telemedicine and the availability of in person appointments. The patient expressed understanding and agreed to proceed.   SUBJECTIVE Chief Complaint  Patient presents with  . Mouth Lesions    Pt has bumps on tongue that are painful. x3 days. Gargling with salt water and started Lysine.     HPI: Carol Norton is a 73 y.o. female present to discuss her mouth sores.  He reports mouth sores for the last few days that are rather painful.  She has 3 sores on her tongue.  She is gargling with salt water and Listerine.  She started lysine tabs today.  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  She is has taken Valtrex in the past.  She does wear CPAP at night and has occasional dry mouth.  She denies any injury or thermal trauma.  She denies fever, chills, nausea, vomit, fatigue or weight spots of her mouth.  ROS: See pertinent positives and negatives per HPI.  Patient Active Problem List   Diagnosis Date Noted  . Statin intolerance 10/04/2018  . Benzodiazepine use agreement exists 02/10/2018  . Carotid atherosclerosis, left 10/14/2017  . Disorder of both mastoids 10/12/2017  . Gastroesophageal reflux disease 01/11/2017  . 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 Generalized anxiety disorder 05/13/2016  . Obesity (BMI 30.0-34.9) 05/13/2016  . Mixed hyperlipidemia 05/13/2016  . 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 Vitamin D deficiency 02/25/2015  . Foraminal stenosis of cervical region 02/20/2015  . Acquired  hypothyroidism 12/17/2014  . Glaucoma 12/17/2014  . Chronic sinusitis 12/17/2014  . Ocular rosacea 12/17/2014  . OSA (obstructive sleep apnea) 10/06/2012    Social History   Tobacco Use  . Smoking status: Former Smoker    Packs/day: 0.75    Years: 25.00    Pack years: 18.75    Types: Cigarettes    Quit date: 05/25/2007    Years since quitting: 11.6  . Smokeless tobacco: Never Used  . Tobacco comment: quit for 10 years in the total 6yrs.   Substance Use Topics  . Alcohol use: No    Comment: rare    Current Outpatient Medications:  .  acetaminophen (TYLENOL 8 HOUR ARTHRITIS PAIN) 650 MG CR tablet, Take 650 mg by mouth every 8 (eight) hours as needed for pain., Disp: , Rfl:  .  ALPRAZolam (XANAX) 0.5 MG tablet, Take 1 tablet (0.5 mg total) by mouth at bedtime., Disp: 90 tablet, Rfl: 1 .  ARMOUR THYROID 60 MG tablet, Take one tablet daily, Disp: 90 tablet, Rfl: 3 .  Cholecalciferol (VITAMIN D) 125 MCG (5000 UT) CAPS, Take 2 capsules by mouth daily., Disp: , Rfl:  .  diclofenac sodium (VOLTAREN) 1 % GEL, diclofenac 1 % topical gel  APPLY 2 GRAM TO THE AFFECTED AREA(S) BY TOPICAL ROUTE 2-3 TIMES PER DAY, Disp: , Rfl:  .  doxycycline (MONODOX) 100 MG capsule, Take 100 mg by mouth daily., Disp: , Rfl:  .  Lysine HCl 1000 MG TABS, Take 1 tablet by mouth 3 (three) times daily.,  Disp: , Rfl:  .  Magnesium 100 MG CAPS, Take 800 mg by mouth. , Disp: , Rfl:  .  methocarbamol (ROBAXIN) 500 MG tablet, Take 500 mg by mouth 3 (three) times daily as needed., Disp: , Rfl:  .  Olopatadine HCl 0.6 % SOLN, Place 2 sprays into both nostrils 2 (two) times daily., Disp: 1 Bottle, Rfl: 5 .  Probiotic Product (ALIGN PO), Take by mouth daily., Disp: , Rfl:  .  Pyridoxine HCl (VITAMIN B-6) 250 MG tablet, Take 750 mg by mouth daily., Disp: , Rfl:  .  traMADol (ULTRAM) 50 MG tablet, tramadol 50 mg tablet  TAKE 1 TABLET BY MOUTH EVERY 6 HOURS AS NEEDED, Disp: , Rfl:   Allergies  Allergen Reactions  . Synthroid  [Levothyroxine Sodium] Shortness Of Breath  . Accolate [Zafirlukast]   . Avelox [Moxifloxacin Hcl In Nacl]   . Citalopram   . Crestor [Rosuvastatin]   . Dexilant [Dexlansoprazole]   . Escitalopram Oxalate   . Nsaids     Hair loss  . Singulair [Montelukast Sodium]     Aggression,anger  . Statins     Myalgias, joint pain.  . Wellbutrin [Bupropion]   . Dymista [Azelastine-Fluticasone]   . Simvastatin Palpitations    OBJECTIVE: BP 127/68   Pulse 67   Temp (!) 97.4 F (36.3 C) (Temporal)   Ht 5\' 7"  (1.702 m)   BMI 31.79 kg/m  Gen: No acute distress. Nontoxic in appearance.  HENT: AT. [place].  MMM.  Difficult to appreciate small blister formation on tongue.  No white exudates noted. Eyes:Pupils Equal Round Reactive to light, Extraocular movements intact,  Conjunctiva without redness, discharge or icterus. Skin: no rashes, purpura or petechiae.  Neuro:  Alert. Oriented x3  Psych: Normal affect, dress and demeanor. Normal speech. Normal thought content and judgment.  ASSESSMENT AND PLAN: Mykenna Service is a 73 y.o. female present for  Fever blister Discussed different possible etiologies with her today including canker sores versus dry mouth ulcerations versus HSV. Encouraged her to hydrate.  And use Biotene 2 times a day.  She does wear a CPAP and blister formations could be secondary to mouth dryness. Valtrex prescribed for her today. She has been under increased stress than usual and that has brought on cold sores in the past.   Follow-up PRN  > 15 minutes spent with patient, > 50% of that time face to face  Howard Pouch, DO 01/22/2019

## 2019-01-30 ENCOUNTER — Encounter: Payer: Self-pay | Admitting: Family Medicine

## 2019-01-30 ENCOUNTER — Telehealth: Payer: Self-pay | Admitting: Family Medicine

## 2019-01-30 NOTE — Telephone Encounter (Signed)
Pt was called and VM was left for patient to return call

## 2019-01-30 NOTE — Telephone Encounter (Signed)
Patient was exposed Friday 01/26/19 and by another person on 01/27/19. She went [facility] on [address] and got tested on 01/28/19. It came back negative. Patient would like to know if she went too early and should she get tested again. Advised patient she would get a call back 01/31/19.

## 2019-01-30 NOTE — Telephone Encounter (Signed)
Patient called back, returning call from nurse/cma.  I read the telephone notes from [name] explaining VV appt or return to free testing site.  She stated she will be going first thing in the morning to be tested again.  Please follow up with patient tomorrow after 10AM.   [phone]  Thank you

## 2019-01-30 NOTE — Telephone Encounter (Signed)
Pt was called and VM was left that she could make a VV appt with MD or go to free testing site, number provided. Pt given instructions on quarantining herself until symptom and fever free for 3 days.

## 2019-01-31 ENCOUNTER — Other Ambulatory Visit: Payer: Self-pay

## 2019-01-31 DIAGNOSIS — Z20822 Contact with and (suspected) exposure to covid-19: Secondary | ICD-10-CM

## 2019-01-31 NOTE — Telephone Encounter (Signed)
Noted. I will not get results of tests at other locations. If pt asks for medical advise or assistance would need to have a virtual visit and prior test results if possible.

## 2019-01-31 NOTE — Telephone Encounter (Signed)
Pt called and LM on nurse VM stating she was on the way to get tested again now. She said she was unable to sleep and her anxiety level is high due to being exposed. Pt was calling to see if it was still to early and if she would need to go again to get tested.

## 2019-01-31 NOTE — Telephone Encounter (Signed)
Patient called in asking to make a virtual visit to discuss COVID testing. No appointments are available with PCP until Monday 02/05/19. Patient declined. Offered patient appointment with another [place] provider, patient declined. Patient went and had COVID test this morning at [facility] location. Patient is very nervous about results. Advised patient that results will be given to her as soon as they are available with advice.

## 2019-02-01 LAB — NOVEL CORONAVIRUS, NAA: SARS-CoV-2, NAA: NOT DETECTED

## 2019-02-01 NOTE — Telephone Encounter (Signed)
Called patient and stated that we wont be able to go over or get any COVID results. Stated that she will have to wait until someone from [facility] calls her. Patient understood, I did tell her if there is any medical advise that she needs. She can always call the office or schedule an appointment

## 2019-02-02 ENCOUNTER — Telehealth: Payer: Self-pay | Admitting: Family Medicine

## 2019-02-02 NOTE — Telephone Encounter (Signed)
Patient called in to let Dr. Raoul Pitch know her COVID-19 test was negative.

## 2019-02-02 NOTE — Telephone Encounter (Signed)
Sent to Dr Kuneff as a FYI  

## 2019-02-13 ENCOUNTER — Encounter: Payer: Self-pay | Admitting: Family Medicine

## 2019-02-13 ENCOUNTER — Ambulatory Visit (INDEPENDENT_AMBULATORY_CARE_PROVIDER_SITE_OTHER): Payer: Medicare Other | Admitting: Family Medicine

## 2019-02-13 ENCOUNTER — Other Ambulatory Visit: Payer: Self-pay

## 2019-02-13 VITALS — BP 122/67 | HR 94 | Temp 97.9°F | Resp 16 | Ht 67.0 in | Wt 199.0 lb

## 2019-02-13 DIAGNOSIS — F411 Generalized anxiety disorder: Secondary | ICD-10-CM

## 2019-02-13 DIAGNOSIS — Z79899 Other long term (current) drug therapy: Secondary | ICD-10-CM

## 2019-02-13 DIAGNOSIS — F1310 Sedative, hypnotic or anxiolytic abuse, uncomplicated: Secondary | ICD-10-CM

## 2019-02-13 MED ORDER — ALPRAZOLAM 0.5 MG PO TABS: 0.5000 mg | ORAL_TABLET | Freq: Two times a day (BID) | ORAL | 1 refills | Status: DC

## 2019-02-13 NOTE — Progress Notes (Signed)
Racquel Norton , 04-Jul-1945, 73 y.o., female MRN: [id number] Patient Care Team    Relationship Specialty Notifications Start End  [name], DO PCP - General Family Medicine  05/26/16   [name], MD Consulting Physician Orthopedic Surgery  03/11/16   [name], NP Nurse Practitioner Nurse Practitioner  05/26/16   [name], MD Consulting Physician Internal Medicine  05/26/16    Comment: endocrine, thyroid  [name], [name], MD Referring Physician Pulmonary Disease  05/26/16   [name], MD Consulting Physician Neurology  05/26/16   [name], MD Consulting Physician Cardiology  05/26/16   [name], MD Consulting Physician Ophthalmology  05/26/16    Comment: Dr Kathlen Mody at [facility] for glaucoma and cataracts every 3 mos  [name], MD Attending Physician Family Medicine  05/26/16    Comment: [facility]    Chief Complaint  Patient presents with  . Anxiety    Pt is here for anxiety F/U and possibly change medications.      Subjective: Carol Norton is a 73 y.o. female present for worsening anxiety.  Anxiety:  Patient reports she recently has an increase in her anxiety.  She typically has been prescribed Xanax 0.5 mg nightly.  She admits she has been taking an additional half tab on occasions throughout the day secondary to her increased anxiety.  She is currently going through a lot of stress surrounding some family concerns in the pandemic.  She has been unable to tolerate citalopram, Lexapro or Wellbutrin in the past.  She has many reactions to different types of medications and is not concerned about trying a different medication.  She is asking that she can have her Xanax prescription increased in amount if needed only. She does have a controlled substance contract. [place] controlled  substance database was reviewed 02/13/2019 and appropriate. Patient has been counseled on the addictive properties of benzodiazepines. She has been tried on other  medications with side effects.    Depression screen [facility] 2/9 02/13/2019 10/04/2018 10/12/2017 09/23/2016 05/26/2016  Decreased Interest 1 0 0 0 0  Down, Depressed, Hopeless 1 1 0 0 0  PHQ - 2 Score 2 1 0 0 0  Altered sleeping 1 0 - - -  Tired, decreased energy 1 0 - - -  Change in appetite 1 0 - - -  Feeling bad or failure about yourself  0 0 - - -  Trouble concentrating 1 0 - - -  Moving slowly or fidgety/restless 0 0 - - -  Suicidal thoughts 0 0 - - -  PHQ-9 Score 6 1 - - -  Difficult doing work/chores Somewhat difficult Not difficult at all - - -   GAD 7 : Generalized Anxiety Score 02/13/2019 10/04/2018 01/11/2017 09/23/2016  Nervous, Anxious, on Edge 2 0 1 2  Control/stop worrying 2 0 2 2  Worry too much - different things 2 1 1 2   Trouble relaxing 1 0 1 1  Restless 0 0 1 1  Easily annoyed or irritable 0 0 0 0  Afraid - awful might happen 2 1 0 1  Total GAD 7 Score 9 2 6 9   Anxiety Difficulty Somewhat difficult Not difficult at all Somewhat difficult Somewhat difficult   Allergies  Allergen Reactions  . Synthroid [Levothyroxine Sodium] Shortness Of Breath  . Accolate [Zafirlukast]   . Avelox [Moxifloxacin Hcl In Nacl]   . Citalopram   . Crestor [Rosuvastatin]   . Dexilant [Dexlansoprazole]   .  Escitalopram Oxalate   . Nsaids     Hair loss  . Singulair [Montelukast Sodium]     Aggression,anger  . Statins     Myalgias, joint pain.  . Wellbutrin [Bupropion]   . Dymista [Azelastine-Fluticasone]   . Simvastatin Palpitations   Social History   Tobacco Use  . Smoking status: Former Smoker    Packs/day: 0.75    Years: 25.00    Pack years: 18.75    Types: Cigarettes    Quit date: 05/25/2007    Years since quitting: 11.7  . Smokeless tobacco: Never Used  . Tobacco comment: quit for 10 years in the total 57yrs.   Substance Use Topics  . Alcohol use: No    Comment: rare   Past Medical History:  Diagnosis Date  . Allergic rhinitis   . Allergy   . Anxiety   . Bilateral  carpal tunnel syndrome    Patient scheduled for surgery with neurology  . Cataract   . Chronic constipation   . Chronic headache   . Chronic sinusitis   . CN (constipation) 12/17/2014  . GERD (gastroesophageal reflux disease)   . Glaucoma   . Hyperlipemia   . Hypothyroid   . Leg length discrepancy   . Ocular rosacea   . Plantar fasciitis   . Sebaceous cyst   . Sleep apnea    on CPAP  . Urethral stenosis   . Vitamin D deficiency    Past Surgical History:  Procedure Laterality Date  . ABDOMINAL HYSTERECTOMY  1983  . BREAST SURGERY     sebaceous cyst  . EYE SURGERY     surgery with laser  . FACIAL COSMETIC SURGERY     1995  . GANGLION CYST EXCISION    . MENISCUS REPAIR Left   . RHINOPLASTY    . SEPTOPLASTY    . TUBAL LIGATION     Family History  Problem Relation Age of Onset  . Emphysema Father   . Alcohol abuse Father   . Allergies Mother   . Heart disease Mother   . Ovarian cancer Mother   . Hyperlipidemia Mother   . Hypothyroidism Mother   . Arthritis Mother   . Hypothyroidism Sister   . Lung cancer Paternal Aunt   . Diabetes Maternal Grandmother   . Heart disease Maternal Grandmother   . Stroke Paternal Grandfather    Allergies as of 02/13/2019      Reactions   Synthroid [levothyroxine Sodium] Shortness Of Breath   Accolate [zafirlukast]    Avelox [moxifloxacin Hcl In Nacl]    Citalopram    Crestor [rosuvastatin]    Dexilant [dexlansoprazole]    Escitalopram Oxalate    Nsaids    Hair loss   Singulair [montelukast Sodium]    Aggression,anger   Statins    Myalgias, joint pain.   Wellbutrin [bupropion]    Dymista [azelastine-fluticasone]    Simvastatin Palpitations      Medication List       Accurate as of February 13, 2019 11:59 PM. If you have any questions, ask your nurse or doctor.        ALIGN PO Take by mouth daily.   ALPRAZolam 0.5 MG tablet Commonly known as: XANAX Take 1 tablet (0.5 mg total) by mouth 2 (two) times daily. What  changed: when to take this Changed by: Howard Pouch, DO   [place] Thyroid 60 MG tablet Generic drug: thyroid Take one tablet daily   diclofenac sodium 1 % Gel Commonly known  as: VOLTAREN diclofenac 1 % topical gel  APPLY 2 GRAM TO THE AFFECTED AREA(S) BY TOPICAL ROUTE 2-3 TIMES PER DAY   doxycycline 100 MG capsule Commonly known as: MONODOX Take 100 mg by mouth daily.   Lysine HCl 1000 MG Tabs Take 1 tablet by mouth 3 (three) times daily.   Magnesium 100 MG Caps Take 800 mg by mouth.   methocarbamol 500 MG tablet Commonly known as: ROBAXIN Take 500 mg by mouth 3 (three) times daily as needed.   Olopatadine HCl 0.6 % Soln Place 2 sprays into both nostrils 2 (two) times daily.   Quercetin 250 MG Tabs Take 1 tablet by mouth daily.   traMADol 50 MG tablet Commonly known as: ULTRAM tramadol 50 mg tablet  TAKE 1 TABLET BY MOUTH EVERY 6 HOURS AS NEEDED   Tylenol 8 Hour Arthritis Pain 650 MG CR tablet Generic drug: acetaminophen Take 650 mg by mouth every 8 (eight) hours as needed for pain.   valACYclovir 1000 MG tablet Commonly known as: VALTREX 2 tabs at onset of cold sore and repeat dose once 12 hours later.   vitamin B-6 250 MG tablet Take 750 mg by mouth daily.   Vitamin D 125 MCG (5000 UT) Caps Take 2 capsules by mouth daily.   Zinc 100 MG Tabs Take 1 tablet by mouth daily.       All past medical history, surgical history, allergies, family history, immunizations andmedications were updated in the EMR today and reviewed under the history and medication portions of their EMR.     ROS: Negative, with the exception of above mentioned in HPI   Objective:  BP 122/67 (BP Location: Right Arm, Patient Position: Sitting, Cuff Size: Normal)   Pulse 94   Temp 97.9 F (36.6 C) (Temporal)   Resp 16   Ht 5\' 7"  (1.702 m)   Wt 199 lb (90.3 kg)   SpO2 97%   BMI 31.17 kg/m  Body mass index is 31.17 kg/m. Gen: Afebrile. No acute distress.  HENT: AT. [place].  Neuro:  Normal gait. PERLA. EOMi. Alert. Oriented x3 Psych: anxious. Tearful. Normal affect, dress and demeanor. Normal speech. Normal thought content and judgment.  No exam data present No results found. No results found for this or any previous visit (from the past 24 hour(s)).  Assessment/Plan: Emmy Charbonneau is a 73 y.o. female present for OV for  Mild benzodiazepine use disorder (HCC) Generalized anxiety disorder - worsening anxiety.  Discussed her worsening anxiety with her today in great detail.  Agreed to increase her Xanax to 0.5 mg twice daily as needed, and she is aware to use the least amount as possible daily. -Patient has been  counseled on addictive properties of benzodiazepines as well as side effect profile in geriatric population.  We did discuss considering changing to Ativan in the future and in length we discussed why this is safer for her.  She still has many concerns and worries about changing medication to something she is not had before in case it does not work or she has a side effect. - Controlled substance contract updated today. - [place] controlled substance database has been reviewed 02/13/2019, appropriate,  made part of her permanent chart. Patient is aware that she will need to be seen every 6 months face-to-face with this provider for refills on this medication. Called into her pharmacy in [place] (spends 6 months a year). They will call to confirm when she picks up script.  - UDS collected.  Follow-up in 6 months   Reviewed expectations re: course of current medical issues.  Discussed self-management of symptoms.  Outlined signs and symptoms indicating need for more acute intervention.  Patient verbalized understanding and all questions were answered.  Patient received an After-Visit Summary.  > 25 minutes spent with patient, >50% of time spent face to face      Orders Placed This Encounter  Procedures  . Pain Mgmt, Profile 8 w/Conf, U     Note is  dictated utilizing voice recognition software. Although note has been proof read prior to signing, occasional typographical errors still can be missed. If any questions arise, please do not hesitate to call for verification.   electronically signed by:  Howard Pouch, DO  [place]

## 2019-02-13 NOTE — Patient Instructions (Signed)
I hope you guys are able to relax down in [place].

## 2019-02-14 ENCOUNTER — Telehealth: Payer: Self-pay | Admitting: Family Medicine

## 2019-02-14 ENCOUNTER — Encounter: Payer: Self-pay | Admitting: Family Medicine

## 2019-02-14 NOTE — Telephone Encounter (Signed)
Patient called about her xanax prescription and [facility] told her yesterday since Dr. Raoul Pitch is changing the dosage, she will be out before the next fill date. Patient will be leaving to go to [place] for several months. I told that it was fine, when she was close to be out of her meds to call the pharmacy in [place] to contact to transfer prescription with updated dosage change.  She was confused it was going to be a hassle to get her meds, I told her no, everything will be just fine.    Just as Juluis Rainier

## 2019-02-16 LAB — PAIN MGMT, PROFILE 8 W/CONF, U
6 Acetylmorphine: NEGATIVE ng/mL
Alcohol Metabolites: NEGATIVE ng/mL (ref <500)
Alphahydroxyalprazolam: 69 ng/mL
Alphahydroxymidazolam: NEGATIVE ng/mL
Alphahydroxytriazolam: NEGATIVE ng/mL
Aminoclonazepam: NEGATIVE ng/mL
Amphetamines: NEGATIVE ng/mL
Benzodiazepines: POSITIVE ng/mL
Buprenorphine, Urine: NEGATIVE ng/mL
Cocaine Metabolite: NEGATIVE ng/mL
Creatinine: 67.9 mg/dL
Hydroxyethylflurazepam: NEGATIVE ng/mL
Lorazepam: NEGATIVE ng/mL
MDMA: NEGATIVE ng/mL
Marijuana Metabolite: NEGATIVE ng/mL
Nordiazepam: NEGATIVE ng/mL
Opiates: NEGATIVE ng/mL
Oxazepam: NEGATIVE ng/mL
Oxidant: NEGATIVE ug/mL
Oxycodone: NEGATIVE ng/mL
Temazepam: NEGATIVE ng/mL
pH: 6.8 (ref 4.5–9.0)

## 2019-02-17 DIAGNOSIS — S83289A Other tear of lateral meniscus, current injury, unspecified knee, initial encounter: Secondary | ICD-10-CM | POA: Insufficient documentation

## 2019-02-17 HISTORY — DX: Other tear of lateral meniscus, current injury, unspecified knee, initial encounter: S83.289A

## 2019-03-06 ENCOUNTER — Other Ambulatory Visit: Payer: Self-pay | Admitting: Family Medicine

## 2019-03-06 DIAGNOSIS — F411 Generalized anxiety disorder: Secondary | ICD-10-CM

## 2019-03-06 NOTE — Telephone Encounter (Signed)
Patient request refill ALPRAZolam (XANAX) 0.5 MG tablet [id number]   [id number] [place], [place] - [address]  [address]

## 2019-03-06 NOTE — Telephone Encounter (Signed)
Pt had stated in previous note that she was going to be living in [facility] for a few months. [place] where Xanax was sent on 02/13/2019- Per pharmacy pt picked up #180 on 02/13/2019 and is to take BID PRN. Pt was called and detailed message was left letting pt know it was to early to refill this medication.

## 2019-03-06 NOTE — Telephone Encounter (Signed)
Pt returned call and states she did not pick them up from the pharmacy. Pt was going to call pharmacy and clarify because she does not have them with her in [facility] and needs the medication.  01/05/2019 was the last fill date with #90 tablets per [facility]. Database printed and placed on Dr Lucita Lora desk to review.

## 2019-03-06 NOTE — Telephone Encounter (Signed)
Per [facility] controlled substance database patient has not picked up a Xanax prescription since 01/05/2019 per review.  It seems as though her pharmacy may be mistaken or has not registered the 9/22 pickup in the database.  -The prescription has been sent by this provider on 9/22. -Please call the pharmacy and let them know per the database she did not pick up a prescription on that day so they are either mistaken or did not reported to the database.

## 2019-03-07 MED ORDER — ALPRAZOLAM 0.5 MG PO TABS: 0.5000 mg | ORAL_TABLET | Freq: Two times a day (BID) | ORAL | 1 refills | Status: DC

## 2019-03-07 NOTE — Addendum Note (Signed)
Addended by: Howard Pouch A on: 03/07/2019 04:58 PM   Modules accepted: Orders

## 2019-03-07 NOTE — Telephone Encounter (Signed)
Pharmacy was called again and asked when last pick up for patient was, Carol Norton, pharmacist confirmed it was 01/05/19. Pt has one refill on file at [place], this refill was cancelled. [facility] where she is living now and will be staying the next couple of months?  RF request for xanax LOV: 02/13/2019 Next ov: Not scheduled  Last written: 02/13/2019 #180 (pharmacy confirmed this was never picked up)   Please advise

## 2019-03-07 NOTE — Addendum Note (Signed)
Addended by: Caroll Rancher L on: 03/07/2019 10:01 AM   Modules accepted: Orders

## 2019-03-07 NOTE — Telephone Encounter (Signed)
I e-scribed the script to location in [place].  Please make pt aware 90 days PLUS 1 refill has been sent. I can not transfer this script again. I suggest she call the pharmacy later today and make sure they except electonic scripts. Every state is different on their laws on how they will except a controlled substance script (electronic, fax, hand written etc).  Script written 02/13/2019 has [facility] [place] [place]

## 2019-03-08 NOTE — Telephone Encounter (Signed)
Pt was called and given information, she verbalized understanding  

## 2019-04-16 ENCOUNTER — Encounter: Payer: Self-pay | Admitting: Family Medicine

## 2019-04-16 ENCOUNTER — Ambulatory Visit (INDEPENDENT_AMBULATORY_CARE_PROVIDER_SITE_OTHER): Payer: Medicare Other | Admitting: Family

## 2019-04-16 DIAGNOSIS — J019 Acute sinusitis, unspecified: Secondary | ICD-10-CM

## 2019-04-16 DIAGNOSIS — B001 Herpesviral vesicular dermatitis: Secondary | ICD-10-CM | POA: Diagnosis not present

## 2019-04-16 MED ORDER — VALACYCLOVIR HCL 1 G PO TABS: ORAL_TABLET | ORAL | 0 refills | Status: DC

## 2019-04-16 MED ORDER — AMOXICILLIN-POT CLAVULANATE 875-125 MG PO TABS: 1.0000 | ORAL_TABLET | Freq: Two times a day (BID) | ORAL | 0 refills | Status: DC

## 2019-04-16 NOTE — Progress Notes (Signed)
Virtual Visit via Video Note  I connected with Carol Norton on 04/16/19 at 11:20 AM EST by a video enabled telemedicine application and verified that I am speaking with the correct person using two identifiers.  Location: Patient: home Provider: work   I discussed the limitations of evaluation and management by telemedicine and the availability of in person appointments. The patient expressed understanding and agreed to proceed.  History of Present Illness:  Patient is a 73 yr old female who presents today with chief complaint of sinus congestion.Reports that she has had sinus congestion x 10 days. Has pressure behind her eyes and aching into her teeth. Using ibuprofen and mucinex without improvement.  Reports previous history of sinusitis with similar presentation.    Cold sore-reports that she woke up this morning with start of a cold sore on her right upper lip.  She took a dose of Valtrex this a.m. however needs a refill.  She reports that she is out of town and is currently in [place].  Past Medical History:  Diagnosis Date  . Allergic rhinitis   . Allergy   . Anxiety   . Bilateral carpal tunnel syndrome    Patient scheduled for surgery with neurology  . Cataract   . Chronic constipation   . Chronic headache   . Chronic sinusitis   . CN (constipation) 12/17/2014  . GERD (gastroesophageal reflux disease)   . Glaucoma   . Hyperlipemia   . Hypothyroid   . Leg length discrepancy   . Ocular rosacea   . Plantar fasciitis   . Sebaceous cyst   . Sleep apnea    on CPAP  . Urethral stenosis   . Vitamin D deficiency      Social History   Socioeconomic History  . Marital status: Married    Spouse name: [name]  . Number of children: 2  . Years of education: 45  . Highest education level: Not on file  Occupational History  . Occupation: retired  Scientific laboratory technician  . Financial resource strain: Not on file  . Food insecurity    Worry: Not on file    Inability: Not on file  .  Transportation needs    Medical: Not on file    Non-medical: Not on file  Tobacco Use  . Smoking status: Former Smoker    Packs/day: 0.75    Years: 25.00    Pack years: 18.75    Types: Cigarettes    Quit date: 05/25/2007    Years since quitting: 11.9  . Smokeless tobacco: Never Used  . Tobacco comment: quit for 10 years in the total 47yrs.   Substance and Sexual Activity  . Alcohol use: No    Comment: rare  . Drug use: No  . Sexual activity: Yes    Partners: Male    Birth control/protection: Surgical    Comment: Married  Lifestyle  . Physical activity    Days per week: Not on file    Minutes per session: Not on file  . Stress: Not on file  Relationships  . Social Herbalist on phone: Not on file    Gets together: Not on file    Attends religious service: Not on file    Active member of club or organization: Not on file    Attends meetings of clubs or organizations: Not on file    Relationship status: Not on file  . Intimate partner violence    Fear of current or ex partner:  Not on file    Emotionally abused: Not on file    Physically abused: Not on file    Forced sexual activity: Not on file  Other Topics Concern  . Not on file  Social History Narrative   Married to [place]. 2 children.   Dinks caffeine.   Patient is right handed.    Exercises routinely.   Wears seatbelt, smoke detector at home.   Feels safe in relationship.    Past Surgical History:  Procedure Laterality Date  . ABDOMINAL HYSTERECTOMY  1983  . BREAST SURGERY     sebaceous cyst  . EYE SURGERY     surgery with laser  . FACIAL COSMETIC SURGERY     1995  . GANGLION CYST EXCISION    . MENISCUS REPAIR Left   . RHINOPLASTY    . SEPTOPLASTY    . TUBAL LIGATION      Family History  Problem Relation Age of Onset  . Emphysema Father   . Alcohol abuse Father   . Allergies Mother   . Heart disease Mother   . Ovarian cancer Mother   . Hyperlipidemia Mother   . Hypothyroidism Mother    . Arthritis Mother   . Hypothyroidism Sister   . Lung cancer Paternal Aunt   . Diabetes Maternal Grandmother   . Heart disease Maternal Grandmother   . Stroke Paternal Grandfather     Allergies  Allergen Reactions  . Synthroid [Levothyroxine Sodium] Shortness Of Breath  . Accolate [Zafirlukast]   . Avelox [Moxifloxacin Hcl In Nacl]   . Citalopram   . Crestor [Rosuvastatin]   . Dexilant [Dexlansoprazole]   . Escitalopram Oxalate   . Nsaids     Hair loss  . Singulair [Montelukast Sodium]     Aggression,anger  . Statins     Myalgias, joint pain.  . Wellbutrin [Bupropion]   . Dymista [Azelastine-Fluticasone]   . Simvastatin Palpitations    Current Outpatient Medications on File Prior to Visit  Medication Sig Dispense Refill  . acetaminophen (TYLENOL 8 HOUR ARTHRITIS PAIN) 650 MG CR tablet Take 650 mg by mouth every 8 (eight) hours as needed for pain.    Marland Kitchen ALPRAZolam (XANAX) 0.5 MG tablet Take 1 tablet (0.5 mg total) by mouth 2 (two) times daily. 180 tablet 1  . ARMOUR THYROID 60 MG tablet Take one tablet daily 90 tablet 3  . Cholecalciferol (VITAMIN D) 125 MCG (5000 UT) CAPS Take 2 capsules by mouth daily.    . diclofenac sodium (VOLTAREN) 1 % GEL diclofenac 1 % topical gel  APPLY 2 GRAM TO THE AFFECTED AREA(S) BY TOPICAL ROUTE 2-3 TIMES PER DAY    . doxycycline (MONODOX) 100 MG capsule Take 100 mg by mouth daily.    Marland Kitchen Lysine HCl 1000 MG TABS Take 1 tablet by mouth 3 (three) times daily.    . Magnesium 100 MG CAPS Take 800 mg by mouth.     . methocarbamol (ROBAXIN) 500 MG tablet Take 500 mg by mouth 3 (three) times daily as needed.    . Olopatadine HCl 0.6 % SOLN Place 2 sprays into both nostrils 2 (two) times daily. 1 Bottle 5  . Probiotic Product (ALIGN PO) Take by mouth daily.    . Pyridoxine HCl (VITAMIN B-6) 250 MG tablet Take 750 mg by mouth daily.    . Quercetin 250 MG TABS Take 1 tablet by mouth daily.    . traMADol (ULTRAM) 50 MG tablet tramadol 50 mg tablet  TAKE  1 TABLET BY MOUTH EVERY 6 HOURS AS NEEDED    . Zinc 100 MG TABS Take 1 tablet by mouth daily.     No current facility-administered medications on file prior to visit.     There were no vitals taken for this visit.     Observations/Objective:   Gen: Awake, alert, no acute distress Resp: Breathing is even and non-labored Psych: calm/pleasant demeanor Neuro: Alert and Oriented x 3, + facial symmetry, speech is clear.   Assessment and Plan:  Sinusitis- new. will rx with augmentin.  Pt is advised to call if symptoms worsen or if not improved in 2-3 days.  Oral cold sore- refill sent for Valtrex. She should complete to two doses.    Follow Up Instructions:    I discussed the assessment and treatment plan with the patient. The patient was provided an opportunity to ask questions and all were answered. The patient agreed with the plan and demonstrated an understanding of the instructions.   The patient was advised to call back or seek an in-person evaluation if the symptoms worsen or if the condition fails to improve as anticipated.  Nance Pear, NP

## 2019-05-22 ENCOUNTER — Telehealth: Payer: Self-pay

## 2019-05-22 NOTE — Telephone Encounter (Signed)
Patient called requesting new order for CPAP supplies. Dr. Neldon Mc has signed the form for [facility] and I have faxed it and placed in bulk scanning.

## 2019-05-25 ENCOUNTER — Ambulatory Visit (INDEPENDENT_AMBULATORY_CARE_PROVIDER_SITE_OTHER)
Admission: RE | Admit: 2019-05-25 | Discharge: 2019-05-25 | Disposition: A | Discharge status: Home / Self Care | Payer: Medicare Other | Source: Ambulatory Visit

## 2019-05-25 DIAGNOSIS — J0100 Acute maxillary sinusitis, unspecified: Secondary | ICD-10-CM | POA: Diagnosis not present

## 2019-05-25 DIAGNOSIS — U071 COVID-19: Secondary | ICD-10-CM

## 2019-05-25 HISTORY — DX: COVID-19: U07.1

## 2019-05-25 MED ORDER — AMOXICILLIN-POT CLAVULANATE 875-125 MG PO TABS: 1.0000 | ORAL_TABLET | Freq: Two times a day (BID) | ORAL | 0 refills | Status: DC

## 2019-05-25 NOTE — Discharge Instructions (Signed)
Take Augmentin as directed.    Follow up with your primary care provider if your symptoms are not improving.

## 2019-05-25 NOTE — ED Provider Notes (Signed)
Virtual Visit via Video Note:  Carol Norton  initiated request for Telemedicine visit with [facility] team. I connected with Carol Norton  on 05/25/2019 at 10:43 AM  for a synchronized telemedicine visit using a video enabled HIPPA compliant telemedicine application. I verified that I am speaking with Carol Norton  using two identifiers. Carol Balloon, NP  was physically located in a [facility] Urgent care site and Carol Norton was located at a different location.   The limitations of evaluation and management by telemedicine as well as the availability of in-person appointments were discussed. Patient was informed that she  may incur a bill ( including co-pay) for this virtual visit encounter. Carol Norton  expressed understanding and gave verbal consent to proceed with virtual visit.     History of Present Illness:Carol Norton  is a 74 y.o. female presents for evaluation of "sinus infection".  She reports sinus pressure, congestion, post nasal drip, ear pain, maxillary sinus tenderness x 7 days.  She denies fever, chills, sore throat, rash, cough, shortness of breath, vomiting, diarrhea, or other symptoms.  Attempted treatment at home with Advil, warm compresses, and nasal spray prescribed by her PCP.  Seen by PCP on 04/16/2019 and successfully treated with Augmentin.     Allergies  Allergen Reactions  . Synthroid [Levothyroxine Sodium] Shortness Of Breath  . Accolate [Zafirlukast]   . Avelox [Moxifloxacin Hcl In Nacl]   . Citalopram   . Crestor [Rosuvastatin]   . Dexilant [Dexlansoprazole]   . Escitalopram Oxalate   . Nsaids     Hair loss  . Singulair [Montelukast Sodium]     Aggression,anger  . Statins     Myalgias, joint pain.  . Wellbutrin [Bupropion]   . Dymista [Azelastine-Fluticasone]   . Simvastatin Palpitations     Past Medical History:  Diagnosis Date  . Allergic rhinitis   . Allergy   . Anxiety   . Bilateral carpal tunnel syndrome    Patient scheduled for surgery with  neurology  . Cataract   . Chronic constipation   . Chronic headache   . Chronic sinusitis   . CN (constipation) 12/17/2014  . GERD (gastroesophageal reflux disease)   . Glaucoma   . Hyperlipemia   . Hypothyroid   . Leg length discrepancy   . Ocular rosacea   . Plantar fasciitis   . Sebaceous cyst   . Sleep apnea    on CPAP  . Urethral stenosis   . Vitamin D deficiency      Social History   Tobacco Use  . Smoking status: Former Smoker    Packs/day: 0.75    Years: 25.00    Pack years: 18.75    Types: Cigarettes    Quit date: 05/25/2007    Years since quitting: 12.0  . Smokeless tobacco: Never Used  . Tobacco comment: quit for 10 years in the total 49yrs.   Substance Use Topics  . Alcohol use: No    Comment: rare  . Drug use: No        Observations/Objective: Physical Exam  VITALS: Patient denies fever. GENERAL: Alert, appears well and in no acute distress. HEENT: Atraumatic. NECK: Normal movements of the head and neck. CARDIOPULMONARY: No increased WOB. Speaking in clear sentences. I:E ratio WNL.  MS: Moves all visible extremities without noticeable abnormality. PSYCH: Pleasant and cooperative, well-groomed. Speech normal rate and rhythm. Affect is appropriate. Insight and judgement are appropriate. Attention is focused, linear, and appropriate.  NEURO: CN grossly  intact. Oriented as arrived to appointment on time with no prompting. Moves both UE equally.  SKIN: No obvious lesions, wounds, erythema, or cyanosis noted on face or hands.   Assessment and Plan:    ICD-10-CM   1. Acute non-recurrent maxillary sinusitis  J01.00        Follow Up Instructions: Treating with Augmentin.  Instructed patient to follow up with her PCP or Urgent Care to be seen in person if her symptoms are not improving.  Patient agrees to plan of care.      I discussed the assessment and treatment plan with the patient. The patient was provided an opportunity to ask questions and all  were answered. The patient agreed with the plan and demonstrated an understanding of the instructions.   The patient was advised to call back or seek an in-person evaluation if the symptoms worsen or if the condition fails to improve as anticipated.      Carol Balloon, NP  05/25/2019 10:43 AM         Carol Balloon, NP 05/25/19 1043

## 2019-05-28 ENCOUNTER — Encounter: Payer: Self-pay | Admitting: Family Medicine

## 2019-05-28 ENCOUNTER — Telehealth: Payer: Self-pay

## 2019-05-28 ENCOUNTER — Ambulatory Visit (INDEPENDENT_AMBULATORY_CARE_PROVIDER_SITE_OTHER): Payer: Medicare Other | Admitting: Family Medicine

## 2019-05-28 VITALS — Temp 97.9°F | Ht 67.0 in

## 2019-05-28 DIAGNOSIS — U071 COVID-19: Secondary | ICD-10-CM

## 2019-05-28 DIAGNOSIS — R0981 Nasal congestion: Secondary | ICD-10-CM | POA: Diagnosis not present

## 2019-05-28 MED ORDER — OLOPATADINE HCL 0.6 % NA SOLN: 2.0000 | Freq: Two times a day (BID) | NASAL | 5 refills | Status: DC

## 2019-05-28 NOTE — Patient Instructions (Signed)
COVID-19 COVID-19 is a respiratory infection that is caused by a virus called severe acute respiratory syndrome coronavirus 2 (SARS-CoV-2). The disease is also known as coronavirus disease or novel coronavirus. In some people, the virus may not cause any symptoms. In others, it may cause a serious infection. The infection can get worse quickly and can lead to complications, such as:  Pneumonia, or infection of the lungs.  Acute respiratory distress syndrome or ARDS. This is a condition in which fluid build-up in the lungs prevents the lungs from filling with air and passing oxygen into the blood.  Acute respiratory failure. This is a condition in which there is not enough oxygen passing from the lungs to the body or when carbon dioxide is not passing from the lungs out of the body.  Sepsis or septic shock. This is a serious bodily reaction to an infection.  Blood clotting problems.  Secondary infections due to bacteria or fungus.  Organ failure. This is when your body's organs stop working. The virus that causes COVID-19 is contagious. This means that it can spread from person to person through droplets from coughs and sneezes (respiratory secretions). What are the causes? This illness is caused by a virus. You may catch the virus by:  Breathing in droplets from an infected person. Droplets can be spread by a person breathing, speaking, singing, coughing, or sneezing.  Touching something, like a table or a doorknob, that was exposed to the virus (contaminated) and then touching your mouth, nose, or eyes. What increases the risk? Risk for infection You are more likely to be infected with this virus if you:  Are within 6 feet (2 meters) of a person with COVID-19.  Provide care for or live with a person who is infected with COVID-19.  Spend time in crowded indoor spaces or live in shared housing. Risk for serious illness You are more likely to become seriously ill from the virus if you:   Are 50 years of age or older. The higher your age, the more you are at risk for serious illness.  Live in a nursing home or long-term care facility.  Have cancer.  Have a long-term (chronic) disease such as: ? Chronic lung disease, including chronic obstructive pulmonary disease or asthma. ? A long-term disease that lowers your body's ability to fight infection (immunocompromised). ? Heart disease, including heart failure, a condition in which the arteries that lead to the heart become narrow or blocked (coronary artery disease), a disease which makes the heart muscle thick, weak, or stiff (cardiomyopathy). ? Diabetes. ? Chronic kidney disease. ? Sickle cell disease, a condition in which red blood cells have an abnormal "sickle" shape. ? Liver disease.  Are obese. What are the signs or symptoms? Symptoms of this condition can range from mild to severe. Symptoms may appear any time from 2 to 14 days after being exposed to the virus. They include:  A fever or chills.  A cough.  Difficulty breathing.  Headaches, body aches, or muscle aches.  Runny or stuffy (congested) nose.  A sore throat.  New loss of taste or smell. Some people may also have stomach problems, such as nausea, vomiting, or diarrhea. Other people may not have any symptoms of COVID-19. How is this diagnosed? This condition may be diagnosed based on:  Your signs and symptoms, especially if: ? You live in an area with a COVID-19 outbreak. ? You recently traveled to or from an area where the virus is common. ? You   provide care for or live with a person who was diagnosed with COVID-19. ? You were exposed to a person who was diagnosed with COVID-19.  A physical exam.  Lab tests, which may include: ? Taking a sample of fluid from the back of your nose and throat (nasopharyngeal fluid), your nose, or your throat using a swab. ? A sample of mucus from your lungs (sputum). ? Blood tests.  Imaging tests, which  may include, X-rays, CT scan, or ultrasound. How is this treated? At present, there is no medicine to treat COVID-19. Medicines that treat other diseases are being used on a trial basis to see if they are effective against COVID-19. Your health care provider will talk with you about ways to treat your symptoms. For most people, the infection is mild and can be managed at home with rest, fluids, and over-the-counter medicines. Treatment for a serious infection usually takes places in a hospital intensive care unit (ICU). It may include one or more of the following treatments. These treatments are given until your symptoms improve.  Receiving fluids and medicines through an IV.  Supplemental oxygen. Extra oxygen is given through a tube in the nose, a face mask, or a hood.  Positioning you to lie on your stomach (prone position). This makes it easier for oxygen to get into the lungs.  Continuous positive airway pressure (CPAP) or bi-level positive airway pressure (BPAP) machine. This treatment uses mild air pressure to keep the airways open. A tube that is connected to a motor delivers oxygen to the body.  Ventilator. This treatment moves air into and out of the lungs by using a tube that is placed in your windpipe.  Tracheostomy. This is a procedure to create a hole in the neck so that a breathing tube can be inserted.  Extracorporeal membrane oxygenation (ECMO). This procedure gives the lungs a chance to recover by taking over the functions of the heart and lungs. It supplies oxygen to the body and removes carbon dioxide. Follow these instructions at home: Lifestyle  If you are sick, stay home except to get medical care. Your health care provider will tell you how long to stay home. Call your health care provider before you go for medical care.  Rest at home as told by your health care provider.  Do not use any products that contain nicotine or tobacco, such as cigarettes, e-cigarettes, and  chewing tobacco. If you need help quitting, ask your health care provider.  Return to your normal activities as told by your health care provider. Ask your health care provider what activities are safe for you. General instructions  Take over-the-counter and prescription medicines only as told by your health care provider.  Drink enough fluid to keep your urine pale yellow.  Keep all follow-up visits as told by your health care provider. This is important. How is this prevented?  There is no vaccine to help prevent COVID-19 infection. However, there are steps you can take to protect yourself and others from this virus. To protect yourself:   Do not travel to areas where COVID-19 is a risk. The areas where COVID-19 is reported change often. To identify high-risk areas and travel restrictions, check the CDC travel website: wwwnc.cdc.gov/travel/notices  If you live in, or must travel to, an area where COVID-19 is a risk, take precautions to avoid infection. ? Stay away from people who are sick. ? Wash your hands often with soap and water for 20 seconds. If soap and water   are not available, use an alcohol-based hand sanitizer. ? Avoid touching your mouth, face, eyes, or nose. ? Avoid going out in public, follow guidance from your state and local health authorities. ? If you must go out in public, wear a cloth face covering or face mask. Make sure your mask covers your nose and mouth. ? Avoid crowded indoor spaces. Stay at least 6 feet (2 meters) away from others. ? Disinfect objects and surfaces that are frequently touched every day. This may include:  Counters and tables.  Doorknobs and light switches.  Sinks and faucets.  Electronics, such as phones, remote controls, keyboards, computers, and tablets. To protect others: If you have symptoms of COVID-19, take steps to prevent the virus from spreading to others.  If you think you have a COVID-19 infection, contact your health care  provider right away. Tell your health care team that you think you may have a COVID-19 infection.  Stay home. Leave your house only to seek medical care. Do not use public transport.  Do not travel while you are sick.  Wash your hands often with soap and water for 20 seconds. If soap and water are not available, use alcohol-based hand sanitizer.  Stay away from other members of your household. Let healthy household members care for children and pets, if possible. If you have to care for children or pets, wash your hands often and wear a mask. If possible, stay in your own room, separate from others. Use a different bathroom.  Make sure that all people in your household wash their hands well and often.  Cough or sneeze into a tissue or your sleeve or elbow. Do not cough or sneeze into your hand or into the air.  Wear a cloth face covering or face mask. Make sure your mask covers your nose and mouth. Where to find more information  Centers for Disease Control and Prevention: www.cdc.gov/coronavirus/2019-ncov/index.html  World Health Organization: www.who.int/health-topics/coronavirus Contact a health care provider if:  You live in or have traveled to an area where COVID-19 is a risk and you have symptoms of the infection.  You have had contact with someone who has COVID-19 and you have symptoms of the infection. Get help right away if:  You have trouble breathing.  You have pain or pressure in your chest.  You have confusion.  You have bluish lips and fingernails.  You have difficulty waking from sleep.  You have symptoms that get worse. These symptoms may represent a serious problem that is an emergency. Do not wait to see if the symptoms will go away. Get medical help right away. Call your local emergency services (911 in the [place] Do not drive yourself to the hospital. Let the emergency medical personnel know if you think you have COVID-19. Summary  COVID-19 is a  respiratory infection that is caused by a virus. It is also known as coronavirus disease or novel coronavirus. It can cause serious infections, such as pneumonia, acute respiratory distress syndrome, acute respiratory failure, or sepsis.  The virus that causes COVID-19 is contagious. This means that it can spread from person to person through droplets from breathing, speaking, singing, coughing, or sneezing.  You are more likely to develop a serious illness if you are 50 years of age or older, have a weak immune system, live in a nursing home, or have chronic disease.  There is no medicine to treat COVID-19. Your health care provider will talk with you about ways to treat your symptoms.    Take steps to protect yourself and others from infection. Wash your hands often and disinfect objects and surfaces that are frequently touched every day. Stay away from people who are sick and wear a mask if you are sick. This information is not intended to replace advice given to you by your health care provider. Make sure you discuss any questions you have with your health care provider. Document Revised: 03/09/2019 Document Reviewed: 06/15/2018 Elsevier Patient Education  2020 Elsevier Inc.  

## 2019-05-28 NOTE — Telephone Encounter (Signed)
Sent to Dr Kuneff to review  

## 2019-05-28 NOTE — Progress Notes (Signed)
VIRTUAL VISIT VIA VIDEO  I connected with Tiffany Kocher on 05/28/19 at 11:30 AM EST by a video enabled telemedicine application and verified that I am speaking with the correct person using two identifiers. Location patient: Home Location provider: [facility], Office Persons participating in the virtual visit: Patient, Dr. Raoul Pitch and R.Baker, LPN  I discussed the limitations of evaluation and management by telemedicine and the availability of in person appointments. The patient expressed understanding and agreed to proceed.   SUBJECTIVE Chief Complaint  Patient presents with  . Facial Pain    loss of taste and smell, no fever, Started May 20, 2018. Pt started Augmentin on 04/24/2019 and is still taking medication.   . Nasal Congestion  . Fatigue    HPI: Carol Norton is a 74 y.o. female present for acute concern.  She has been struggling with sinus infections and symptoms since the end of November.  She was treated in November with Augmentin and did see some improvement.  She is currently residing in [place] for part of the year.  She was recently visiting her family in [place] over Christmas holiday.  On December 28 her sinus symptoms changed and she started noticing more fatigue and loss of taste and smell.  She was seen by telemedicine on 05/24/2018 with another provider and again provided with another round of antibiotic.  During our video visit today patient received a call from her family member in [place], and they have tested positive for Covid.  Her husband is also ill.  She has made an appointment for her and her husband to have testing this afternoon locally in order.  She denies fever, chills, nausea, vomit, GI complaints or shortness of breath.  ROS: See pertinent positives and negatives per HPI.  Patient Active Problem List   Diagnosis Date Noted  . Statin intolerance 10/04/2018  . Benzodiazepine use agreement exists 02/10/2018  . Carotid atherosclerosis, left  10/14/2017  . Disorder of both mastoids 10/12/2017  . Gastroesophageal reflux disease 01/11/2017  . 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 Generalized anxiety disorder 05/13/2016  . Obesity (BMI 30.0-34.9) 05/13/2016  . Mixed hyperlipidemia 05/13/2016  . 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 Vitamin D deficiency 02/25/2015  . Foraminal stenosis of cervical region 02/20/2015  . Acquired hypothyroidism 12/17/2014  . Glaucoma 12/17/2014  . Chronic sinusitis 12/17/2014  . Ocular rosacea 12/17/2014  . OSA (obstructive sleep apnea) 10/06/2012    Social History   Tobacco Use  . Smoking status: Former Smoker    Packs/day: 0.75    Years: 25.00    Pack years: 18.75    Types: Cigarettes    Quit date: 05/25/2007    Years since quitting: 12.0  . Smokeless tobacco: Never Used  . Tobacco comment: quit for 10 years in the total 37yrs.   Substance Use Topics  . Alcohol use: No    Comment: rare    Current Outpatient Medications:  .  acetaminophen (TYLENOL 8 HOUR ARTHRITIS PAIN) 650 MG CR tablet, Take 650 mg by mouth every 8 (eight) hours as needed for pain., Disp: , Rfl:  .  ALPRAZolam (XANAX) 0.5 MG tablet, Take 1 tablet (0.5 mg total) by mouth 2 (two) times daily., Disp: 180 tablet, Rfl: 1 .  ARMOUR THYROID 60 MG tablet, Take one tablet daily, Disp: 90 tablet, Rfl: 3 .  Cholecalciferol (VITAMIN D) 125 MCG (5000 UT) CAPS, Take 2 capsules by mouth daily., Disp: , Rfl:  .  diclofenac sodium (VOLTAREN) 1 %  GEL, diclofenac 1 % topical gel  APPLY 2 GRAM TO THE AFFECTED AREA(S) BY TOPICAL ROUTE 2-3 TIMES PER DAY, Disp: , Rfl:  .  doxycycline (MONODOX) 100 MG capsule, Take 100 mg by mouth daily., Disp: , Rfl:  .  Lysine HCl 1000 MG TABS, Take 1 tablet by mouth 3 (three) times daily., Disp: , Rfl:  .  Magnesium 100 MG CAPS, Take 800 mg by mouth. , Disp: , Rfl:  .  methocarbamol (ROBAXIN) 500 MG tablet, Take 500 mg by mouth 3 (three) times daily as needed., Disp: , Rfl:  .  Olopatadine HCl  0.6 % SOLN, Place 2 sprays into both nostrils 2 (two) times daily., Disp: 30.5 g, Rfl: 5 .  Probiotic Product (ALIGN PO), Take by mouth daily., Disp: , Rfl:  .  Pyridoxine HCl (VITAMIN B-6) 250 MG tablet, Take 750 mg by mouth daily., Disp: , Rfl:  .  Quercetin 250 MG TABS, Take 1 tablet by mouth daily., Disp: , Rfl:  .  traMADol (ULTRAM) 50 MG tablet, tramadol 50 mg tablet  TAKE 1 TABLET BY MOUTH EVERY 6 HOURS AS NEEDED, Disp: , Rfl:  .  valACYclovir (VALTREX) 1000 MG tablet, 2 tabs at onset of cold sore and repeat dose once 12 hours later., Disp: 8 tablet, Rfl: 0 .  Zinc 100 MG TABS, Take 1 tablet by mouth daily., Disp: , Rfl:   Allergies  Allergen Reactions  . Synthroid [Levothyroxine Sodium] Shortness Of Breath  . Accolate [Zafirlukast]   . Avelox [Moxifloxacin Hcl In Nacl]   . Citalopram   . Crestor [Rosuvastatin]   . Dexilant [Dexlansoprazole]   . Escitalopram Oxalate   . Nsaids     Hair loss  . Singulair [Montelukast Sodium]     Aggression,anger  . Statins     Myalgias, joint pain.  . Wellbutrin [Bupropion]   . Dymista [Azelastine-Fluticasone]   . Simvastatin Palpitations    OBJECTIVE: Temp 97.9 F (36.6 C) (Temporal)   Ht 5\' 7"  (1.702 m)   BMI 31.17 kg/m   Gen: No acute distress. Nontoxic in appearance.  HENT: AT. [place].  MMM.  Eyes:Pupils Equal Round Reactive to light, Extraocular movements intact,  Conjunctiva without redness, discharge or icterus. Chest: Cough or shortness of breath not present. Neuro:  Alert. Oriented x3  Psych: Normal affect, dress and demeanor. Normal speech. Normal thought content and judgment.  ASSESSMENT AND PLAN: Lagina Hover is a 74 y.o. female present for  COVID-19 education/sinus congestion Rest, hydrate.  mucinex (DM if cough), nettie pot or nasal saline.  Olopatadine nasal spray refilled for her.  Advised her she could continue to take the Augmentin prescribed by prior provider-although if sinus symptoms are from COVID-19 alone the  Augmentin would not treat those symptoms. COVID-19 education, protocol, self-isolation and emergent precautions discussed with her today. Patient will report back on her and her husband's COVID-19 results after they are received. She reports understanding emergent precautions.     No orders of the defined types were placed in this encounter.  Meds ordered this encounter  Medications  . Olopatadine HCl 0.6 % SOLN    Sig: Place 2 sprays into both nostrils 2 (two) times daily.    Dispense:  30.5 g    Refill:  [place], DO 05/28/2019

## 2019-05-28 NOTE — Telephone Encounter (Signed)
Pt called 05/24/2018 with sinus pressure x2 days, ear pain, congested, throat raspy. Appt scheduled for doxy.me visit as she is in [facility].

## 2019-05-28 NOTE — Telephone Encounter (Signed)
Patient is COVID +. No call back requested.

## 2019-06-04 ENCOUNTER — Telehealth: Payer: Self-pay | Admitting: Family Medicine

## 2019-06-04 NOTE — Telephone Encounter (Signed)
Patient refill request  fluconazole (DIFLUCAN) 150 MG tablet [id number]  [id number] - [place], [place] - [address]  [facility] [place]  Patient can be reached [phone]

## 2019-06-05 ENCOUNTER — Encounter: Payer: Self-pay | Admitting: Family Medicine

## 2019-06-05 ENCOUNTER — Other Ambulatory Visit: Payer: Self-pay

## 2019-06-05 ENCOUNTER — Ambulatory Visit (INDEPENDENT_AMBULATORY_CARE_PROVIDER_SITE_OTHER): Payer: Medicare Other | Admitting: Family Medicine

## 2019-06-05 VITALS — Ht 67.0 in

## 2019-06-05 DIAGNOSIS — N898 Other specified noninflammatory disorders of vagina: Secondary | ICD-10-CM | POA: Diagnosis not present

## 2019-06-05 MED ORDER — FLUCONAZOLE 150 MG PO TABS: 150.0000 mg | ORAL_TABLET | Freq: Once | ORAL | 0 refills | Status: AC

## 2019-06-05 NOTE — Telephone Encounter (Signed)
Patient scheduled appointment on 06/06/19.

## 2019-06-05 NOTE — Progress Notes (Signed)
VIRTUAL VISIT VIA VIDEO  I connected with Carol Norton on 06/05/19 at  2:45 PM EST by a video enabled telemedicine application and verified that I am speaking with the correct person using two identifiers. Location patient: Home Location provider: [facility], Office Persons participating in the virtual visit: Patient, Dr. Raoul Pitch and R.Baker, LPN  I discussed the limitations of evaluation and management by telemedicine and the availability of in person appointments. The patient expressed understanding and agreed to proceed.   SUBJECTIVE Chief Complaint  Patient presents with  . Vaginal Itching    Vaginal itching that she thinks is a yeast infection due to ABX she was on- Amoxicilin. Pt finished abx several days ago.     HPI: Carol Norton is a 74 y.o. female with vaginal itching that started 2 days ago. She denies vaginal discharge. She has recently finished a round of antibiotics secondary to an infection.  She states she has scratched so deeply she has bright blood to her skin.  She has had yeast infections in the past after antibiotics but it is not common for her.  She has not tried anything over-the-counter.  ROS: See pertinent positives and negatives per HPI.  Patient Active Problem List   Diagnosis Date Noted  . Statin intolerance 10/04/2018  . Benzodiazepine use agreement exists 02/10/2018  . Carotid atherosclerosis, left 10/14/2017  . Disorder of both mastoids 10/12/2017  . Gastroesophageal reflux disease 01/11/2017  . 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 Generalized anxiety disorder 05/13/2016  . Obesity (BMI 30.0-34.9) 05/13/2016  . Mixed hyperlipidemia 05/13/2016  . 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 Vitamin D deficiency 02/25/2015  . Foraminal stenosis of cervical region 02/20/2015  . Acquired hypothyroidism 12/17/2014  . Glaucoma 12/17/2014  . Chronic sinusitis 12/17/2014  . Ocular rosacea 12/17/2014  . OSA (obstructive sleep apnea) 10/06/2012     Social History   Tobacco Use  . Smoking status: Former Smoker    Packs/day: 0.75    Years: 25.00    Pack years: 18.75    Types: Cigarettes    Quit date: 05/25/2007    Years since quitting: 12.0  . Smokeless tobacco: Never Used  . Tobacco comment: quit for 10 years in the total 23yrs.   Substance Use Topics  . Alcohol use: No    Comment: rare    Current Outpatient Medications:  .  acetaminophen (TYLENOL 8 HOUR ARTHRITIS PAIN) 650 MG CR tablet, Take 650 mg by mouth every 8 (eight) hours as needed for pain., Disp: , Rfl:  .  ALPRAZolam (XANAX) 0.5 MG tablet, Take 1 tablet (0.5 mg total) by mouth 2 (two) times daily., Disp: 180 tablet, Rfl: 1 .  ARMOUR THYROID 60 MG tablet, Take one tablet daily, Disp: 90 tablet, Rfl: 3 .  Cholecalciferol (VITAMIN D) 125 MCG (5000 UT) CAPS, Take 2 capsules by mouth daily., Disp: , Rfl:  .  diclofenac sodium (VOLTAREN) 1 % GEL, diclofenac 1 % topical gel  APPLY 2 GRAM TO THE AFFECTED AREA(S) BY TOPICAL ROUTE 2-3 TIMES PER DAY, Disp: , Rfl:  .  doxycycline (MONODOX) 100 MG capsule, Take 100 mg by mouth daily., Disp: , Rfl:  .  Magnesium 100 MG CAPS, Take 800 mg by mouth. , Disp: , Rfl:  .  methocarbamol (ROBAXIN) 500 MG tablet, Take 500 mg by mouth 3 (three) times daily as needed., Disp: , Rfl:  .  Olopatadine HCl 0.6 % SOLN, Place 2 sprays into both nostrils 2 (two) times  daily., Disp: 30.5 g, Rfl: 5 .  Probiotic Product (ALIGN PO), Take by mouth daily., Disp: , Rfl:  .  Pyridoxine HCl (VITAMIN B-6) 250 MG tablet, Take 750 mg by mouth daily., Disp: , Rfl:  .  Quercetin 250 MG TABS, Take 1 tablet by mouth daily., Disp: , Rfl:  .  traMADol (ULTRAM) 50 MG tablet, tramadol 50 mg tablet  TAKE 1 TABLET BY MOUTH EVERY 6 HOURS AS NEEDED, Disp: , Rfl:  .  Zinc 100 MG TABS, Take 1 tablet by mouth daily., Disp: , Rfl:  .  Lysine HCl 1000 MG TABS, Take 1 tablet by mouth 3 (three) times daily., Disp: , Rfl:  .  valACYclovir (VALTREX) 1000 MG tablet, 2 tabs at onset  of cold sore and repeat dose once 12 hours later., Disp: 8 tablet, Rfl: 0  Allergies  Allergen Reactions  . Synthroid [Levothyroxine Sodium] Shortness Of Breath  . Accolate [Zafirlukast]   . Avelox [Moxifloxacin Hcl In Nacl]   . Citalopram   . Crestor [Rosuvastatin]   . Dexilant [Dexlansoprazole]   . Escitalopram Oxalate   . Nsaids     Hair loss  . Singulair [Montelukast Sodium]     Aggression,anger  . Statins     Myalgias, joint pain.  . Wellbutrin [Bupropion]   . Dymista [Azelastine-Fluticasone]   . Simvastatin Palpitations    OBJECTIVE: Ht 5\' 7"  (1.702 m)   BMI 31.17 kg/m  Gen: No acute distress. Nontoxic in appearance.  HENT: AT. [place].  MMM.  Eyes:Pupils Equal Round Reactive to light, Extraocular movements intact,  Conjunctiva without redness, discharge or icterus. Psych: Normal affect, dress and demeanor. Normal speech. Normal thought content and judgment.  ASSESSMENT AND PLAN: Carol Norton is a 74 y.o. female present for  Vaginal irritation Discussed options with her today.  Agreed to call in [place]. Advised her to try over-the-counter Monistat ovules in the future at onset of symptoms. If symptoms do not improve encouraged her to be seen in person for evaluation.  There are other types of infections/conditions that can cause vaginal irritation.  Patient agrees with plan.   No orders of the defined types were placed in this encounter.  Meds ordered this encounter  Medications  . fluconazole (DIFLUCAN) 150 MG tablet    Sig: Take 1 tablet (150 mg total) by mouth once for 1 dose.    Dispense:  1 tablet    Refill:  [place], DO 06/05/2019

## 2019-06-05 NOTE — Telephone Encounter (Signed)
Pt was called to get set up with a VV. VM was full and was unable to leave a VM

## 2019-06-05 NOTE — Telephone Encounter (Signed)
Pt was called and agreed with 2:45 appt time for VV. Okay per Dr Raoul Pitch. Cancelled 06/06/2019 appt

## 2019-06-06 ENCOUNTER — Ambulatory Visit: Payer: Medicare Other | Admitting: Family Medicine

## 2019-09-03 LAB — HM DIABETES EYE EXAM

## 2019-09-05 ENCOUNTER — Encounter: Payer: Self-pay | Admitting: Family Medicine

## 2019-10-31 ENCOUNTER — Telehealth: Payer: Self-pay

## 2019-10-31 ENCOUNTER — Ambulatory Visit: Payer: Medicare Other | Admitting: Family Medicine

## 2019-10-31 ENCOUNTER — Other Ambulatory Visit: Payer: Self-pay

## 2019-10-31 ENCOUNTER — Encounter: Payer: Self-pay | Admitting: Family Medicine

## 2019-10-31 VITALS — BP 129/75 | HR 89 | Temp 98.0°F | Resp 19 | Ht 67.0 in | Wt 192.2 lb

## 2019-10-31 DIAGNOSIS — S40862A Insect bite (nonvenomous) of left upper arm, initial encounter: Secondary | ICD-10-CM

## 2019-10-31 DIAGNOSIS — W57XXXA Bitten or stung by nonvenomous insect and other nonvenomous arthropods, initial encounter: Secondary | ICD-10-CM | POA: Diagnosis not present

## 2019-10-31 MED ORDER — TRIAMCINOLONE ACETONIDE 0.1 % EX CREA: 1.0000 "application " | TOPICAL_CREAM | Freq: Two times a day (BID) | CUTANEOUS | 0 refills | Status: DC

## 2019-10-31 NOTE — Telephone Encounter (Signed)
Patients husband called in stating that wife was bitten by a spider about 2:45 and the bite has began to swell, and blister. She does not have any SOB or fever. Wanting to know what should she do     Please call and advise

## 2019-10-31 NOTE — Progress Notes (Signed)
This visit occurred during the SARS-CoV-2 public health emergency.  Safety protocols were in place, including screening questions prior to the visit, additional usage of staff PPE, and extensive cleaning of exam room while observing appropriate contact time as indicated for disinfecting solutions.    Carol Norton , 05-27-1945, 74 y.o., female MRN: [id number] Patient Care Team    Relationship Specialty Notifications Start End  [name], DO PCP - General Family Medicine  05/26/16   [name], MD Consulting Physician Orthopedic Surgery  03/11/16   [name], NP Nurse Practitioner Nurse Practitioner  05/26/16   [name], MD Consulting Physician Internal Medicine  05/26/16    Comment: endocrine, thyroid  [name], [name], MD Referring Physician Pulmonary Disease  05/26/16   [name], MD Consulting Physician Neurology  05/26/16   [name], MD Consulting Physician Cardiology  05/26/16   [name], MD Consulting Physician Ophthalmology  05/26/16    Comment: Dr Kathlen Mody at [facility] for glaucoma and cataracts every 3 mos  [name], MD Attending Physician Family Medicine  05/26/16    Comment: [facility]    Chief Complaint  Patient presents with  . Insect Bite    Pt was working in yard and felt sting. Has two small dots and ot states it looks blistered. Took two benadryl  when it happened at 2:45.      Subjective: Pt presents for an OV with complaints of insect bite that occurred today around 2:45 PM.  She states she felt the bite, but did not see the insect.  She was concerned because she felt there was too little blister/puncture marks in her arm and she was concerned for a spider bite.  She placed cortisone cream over the area and took 50 mg of Benadryl.  She denies any shortness of breath or facial swelling.  Depression screen [facility] 2/9 02/13/2019 10/04/2018 10/12/2017 09/23/2016 05/26/2016  Decreased Interest 1 0 0 0 0  Down, Depressed, Hopeless 1 1 0 0 0   PHQ - 2 Score 2 1 0 0 0  Altered sleeping 1 0 - - -  Tired, decreased energy 1 0 - - -  Change in appetite 1 0 - - -  Feeling bad or failure about yourself  0 0 - - -  Trouble concentrating 1 0 - - -  Moving slowly or fidgety/restless 0 0 - - -  Suicidal thoughts 0 0 - - -  PHQ-9 Score 6 1 - - -  Difficult doing work/chores Somewhat difficult Not difficult at all - - -    Allergies  Allergen Reactions  . Synthroid [Levothyroxine Sodium] Shortness Of Breath  . Accolate [Zafirlukast]   . Avelox [Moxifloxacin Hcl In Nacl]   . Citalopram   . Crestor [Rosuvastatin]   . Dexilant [Dexlansoprazole]   . Escitalopram Oxalate   . Nsaids     Hair loss  . Singulair [Montelukast Sodium]     Aggression,anger  . Statins     Myalgias, joint pain.  . Wellbutrin [Bupropion]   . Dymista [Azelastine-Fluticasone]   . Simvastatin Palpitations   Social History   Social History Narrative   Married to [place]. 2 children.   Dinks caffeine.   Patient is right handed.    Exercises routinely.   Wears seatbelt, smoke detector at home.   Feels safe in relationship.   Past Medical History:  Diagnosis Date  . Allergic rhinitis   . Allergy   . Anxiety   .  Bilateral carpal tunnel syndrome    Patient scheduled for surgery with neurology  . Cataract   . Chronic constipation   . Chronic headache   . Chronic sinusitis   . CN (constipation) 12/17/2014  . COVID-19 virus infection 05/2019  . GERD (gastroesophageal reflux disease)   . Glaucoma   . Hyperlipemia   . Hypothyroid   . Leg length discrepancy   . Ocular rosacea   . Plantar fasciitis   . Sebaceous cyst   . Sleep apnea    on CPAP  . Urethral stenosis   . Vitamin D deficiency    Past Surgical History:  Procedure Laterality Date  . ABDOMINAL HYSTERECTOMY  1983  . BREAST SURGERY     sebaceous cyst  . EYE SURGERY     surgery with laser  . FACIAL COSMETIC SURGERY     1995  . GANGLION CYST EXCISION    . MENISCUS REPAIR Left   .  RHINOPLASTY    . SEPTOPLASTY    . TUBAL LIGATION     Family History  Problem Relation Age of Onset  . Emphysema Father   . Alcohol abuse Father   . Allergies Mother   . Heart disease Mother   . Ovarian cancer Mother   . Hyperlipidemia Mother   . Hypothyroidism Mother   . Arthritis Mother   . Hypothyroidism Sister   . Lung cancer Paternal Aunt   . Diabetes Maternal Grandmother   . Heart disease Maternal Grandmother   . Stroke Paternal Grandfather    Allergies as of 10/31/2019      Reactions   Synthroid [levothyroxine Sodium] Shortness Of Breath   Accolate [zafirlukast]    Avelox [moxifloxacin Hcl In Nacl]    Citalopram    Crestor [rosuvastatin]    Dexilant [dexlansoprazole]    Escitalopram Oxalate    Nsaids    Hair loss   Singulair [montelukast Sodium]    Aggression,anger   Statins    Myalgias, joint pain.   Wellbutrin [bupropion]    Dymista [azelastine-fluticasone]    Simvastatin Palpitations      Medication List       Accurate as of October 31, 2019  3:46 PM. If you have any questions, ask your nurse or doctor.        ALIGN PO Take by mouth daily.   ALPRAZolam 0.5 MG tablet Commonly known as: XANAX Take 1 tablet (0.5 mg total) by mouth 2 (two) times daily.   Armour Thyroid 60 MG tablet Generic drug: thyroid Take one tablet daily   diclofenac sodium 1 % Gel Commonly known as: VOLTAREN diclofenac 1 % topical gel  APPLY 2 GRAM TO THE AFFECTED AREA(S) BY TOPICAL ROUTE 2-3 TIMES PER DAY   doxycycline 100 MG capsule Commonly known as: MONODOX Take 100 mg by mouth daily.   Lysine HCl 1000 MG Tabs Take 1 tablet by mouth 3 (three) times daily.   Magnesium 100 MG Caps Take 800 mg by mouth.   methocarbamol 500 MG tablet Commonly known as: ROBAXIN Take 500 mg by mouth 3 (three) times daily as needed.   Olopatadine HCl 0.6 % Soln Place 2 sprays into both nostrils 2 (two) times daily.   Quercetin 250 MG Tabs Take 1 tablet by mouth daily.   traMADol 50  MG tablet Commonly known as: ULTRAM tramadol 50 mg tablet  TAKE 1 TABLET BY MOUTH EVERY 6 HOURS AS NEEDED   triamcinolone cream 0.1 % Commonly known as: KENALOG Apply 1 application topically  2 (two) times daily. Started by: Howard Pouch, DO   Tylenol 8 Hour Arthritis Pain 650 MG CR tablet Generic drug: acetaminophen Take 650 mg by mouth every 8 (eight) hours as needed for pain.   valACYclovir 1000 MG tablet Commonly known as: VALTREX 2 tabs at onset of cold sore and repeat dose once 12 hours later.   vitamin B-6 250 MG tablet Take 750 mg by mouth daily.   Vitamin D 125 MCG (5000 UT) Caps Take 2 capsules by mouth daily.   Zinc 100 MG Tabs Take 1 tablet by mouth daily.       All past medical history, surgical history, allergies, family history, immunizations andmedications were updated in the EMR today and reviewed under the history and medication portions of their EMR.     ROS: Negative, with the exception of above mentioned in HPI   Objective:  BP 129/75 (BP Location: Right Arm, Patient Position: Sitting, Cuff Size: Normal)   Pulse 89   Temp 98 F (36.7 C) (Temporal)   Resp 19   Ht 5\' 7"  (1.702 m)   Wt 192 lb 3 oz (87.2 kg)   SpO2 97%   BMI 30.10 kg/m  Body mass index is 30.1 kg/m. Gen: Afebrile. No acute distress. Nontoxic in appearance, well developed, well nourished.  HENT: AT. [place].  No cough, no hoarseness, no shortness of breath.  No facial swelling.   Eyes:Pupils Equal Round Reactive to light, Extraocular movements intact,  Conjunctiva without redness, discharge or icterus. Skin: About a quarter size patch of mildly raised red histamine reaction left medial upper arm.  No bleeding.  No drainage.  No vesicles or blistering present. Neuro: Normal gait. PERLA. EOMi. Alert. Oriented x3  No exam data present No results found. No results found for this or any previous visit (from the past 24 hour(s)).  Assessment/Plan: Carol Norton is a 74 y.o. female present  for OV for  Insect bite of left upper arm, initial encounter No red flags on exam.  Histamine reaction seems to be responding well to Benadryl and hydrocortisone. Kenalog cream prescribed. Encourage patient to purchase over-the-counter Benadryl gel for comfort Monitor for any signs of infection. Follow-up as needed.  Patient does have follow-up for chronic conditions on Friday which we will recheck at that time.   Reviewed expectations re: course of current medical issues.  Discussed self-management of symptoms.  Outlined signs and symptoms indicating need for more acute intervention.  Patient verbalized understanding and all questions were answered.  Patient received an After-Visit Summary.    No orders of the defined types were placed in this encounter.  Meds ordered this encounter  Medications  . triamcinolone cream (KENALOG) 0.1 %    Sig: Apply 1 application topically 2 (two) times daily.    Dispense:  30 g    Refill:  0   Referral Orders  No referral(s) requested today     Note is dictated utilizing voice recognition software. Although note has been proof read prior to signing, occasional typographical errors still can be missed. If any questions arise, please do not hesitate to call for verification.   electronically signed by:  Howard Pouch, DO  [place]

## 2019-10-31 NOTE — Patient Instructions (Signed)
I have called in kenalog cream.  Pick up benadryl gel (over the counter) also and apply a few times a day.   Look for any blistering under the skin or drainage. I suspect it will be fine with the use of the gel and steroid cream.     Carol Norton Spider Bite  A bite from a brown recluse spider is usually mild, but it can be life threatening. Brown recluse spiders can be dark brown to light tan in color. On their back, they have a band of darker color that is shaped like a violin. A bite wound may take 1-2 months to heal. If you think you were bitten by a brown recluse spider, get medical help right away. Follow these instructions at home: Medicines  Take or apply over-the-counter and prescription medicines only as told by your doctor.  If you were prescribed an antibiotic medicine, take or apply it as told by your doctor. Do not stop using the antibiotic even if you start to feel better. Wound care   Follow instructions from your doctor about how to take care of your bite wound. Make sure you: ? Wash your hands with soap and water before you change your bandage (dressing). If you cannot use soap and water, use hand sanitizer. ? Change your bandage as told by your doctor. ? Keep the bite area clean and dry. ? Wash the bite area every day, as told by your doctor. Use soap and water.  Do not scratch the bite area.  Check the bite area every day for signs of infection. Check for: ? More redness, swelling, or pain. ? Fluid or blood. ? Warmth. ? Pus or a bad smell. General instructions   If told, put ice on the bite area. ? Put ice in a plastic bag. ? Place a towel between your skin and the bag. ? Leave the ice on for 20 minutes, 2-3 times a day.  Raise (elevate) the bite area above the level of your heart while you are sitting or lying down, if you can.  Keep all follow-up visits as told by your doctor. This is important. Contact a doctor if you:  Notice that your symptoms  do not get better after 24 hours.  Notice that your symptoms get worse.  Have more redness, swelling, or pain around your wound.  Notice that your wound feels warm to the touch.  Notice pus or a bad smell coming from your wound. Get help right away if you:  Notice that your wound seems to be getting bigger or deeper.  Have fluid or blood coming from your wound.  Have chills or a fever.  Feel sick to your stomach (nauseous).  Throw up (vomit).  Have muscle aches.  Feel unusually weak or tired.  Have muscle movements that you cannot control (convulsions).  Develop a rash.  Pee (urinate) less often than usual.  Have blood in your pee or other unusual bleeding.  Notice that your skin turns yellow. Summary  Brown recluse spiders can be dark brown to light tan in color. They have a dark brown violin shape on their back.  A bite from a brown recluse spider is usually mild, but it can be life threatening.  Keep the bite area clean and dry.  Check the bite area every day for signs of infection. This information is not intended to replace advice given to you by your health care provider. Make sure you discuss any questions you have with  your health care provider. Document Revised: 05/13/2017 Document Reviewed: 03/10/2017 Elsevier Patient Education  2020 [employer].

## 2019-10-31 NOTE — Telephone Encounter (Signed)
Per Dr Raliegh Ip she will work in pt and she could come now. Placed on 4pm time slot. Pt has taken Benadryl but has someone to drive her.

## 2019-11-02 ENCOUNTER — Ambulatory Visit: Payer: Medicare Other | Admitting: Family Medicine

## 2019-11-02 ENCOUNTER — Encounter: Payer: Self-pay | Admitting: Family Medicine

## 2019-11-02 ENCOUNTER — Other Ambulatory Visit: Payer: Self-pay

## 2019-11-02 VITALS — BP 126/87 | HR 99 | Temp 98.2°F | Resp 18 | Ht 67.0 in | Wt 197.0 lb

## 2019-11-02 DIAGNOSIS — E782 Mixed hyperlipidemia: Secondary | ICD-10-CM

## 2019-11-02 DIAGNOSIS — E039 Hypothyroidism, unspecified: Secondary | ICD-10-CM

## 2019-11-02 DIAGNOSIS — Z79899 Other long term (current) drug therapy: Secondary | ICD-10-CM

## 2019-11-02 DIAGNOSIS — F411 Generalized anxiety disorder: Secondary | ICD-10-CM

## 2019-11-02 DIAGNOSIS — Z789 Other specified health status: Secondary | ICD-10-CM

## 2019-11-02 DIAGNOSIS — E559 Vitamin D deficiency, unspecified: Secondary | ICD-10-CM | POA: Diagnosis not present

## 2019-11-02 MED ORDER — ALPRAZOLAM 0.5 MG PO TABS: 0.5000 mg | ORAL_TABLET | Freq: Two times a day (BID) | ORAL | 1 refills | Status: DC

## 2019-11-02 NOTE — Progress Notes (Signed)
Carol Norton , Oct 08, 1945, 74 y.o., female MRN: [id number] Patient Care Team    Relationship Specialty Notifications Start End  [name], DO PCP - General Family Medicine  05/26/16   [name], MD Consulting Physician Orthopedic Surgery  03/11/16   [name], NP Nurse Practitioner Nurse Practitioner  05/26/16   [name], MD Consulting Physician Internal Medicine  05/26/16    Comment: endocrine, thyroid  [name], [name], MD Referring Physician Pulmonary Disease  05/26/16   [name], MD Consulting Physician Neurology  05/26/16   [name], MD Consulting Physician Cardiology  05/26/16   [name], MD Consulting Physician Ophthalmology  05/26/16    Comment: Dr Kathlen Mody at [facility] for glaucoma and cataracts every 3 mos  [name], MD Attending Physician Family Medicine  05/26/16    Comment: [facility]    Chief Complaint  Patient presents with  . Hypothyroidism    Needs refills on both meds, xanax and thyroid   . Anxiety     Subjective: Carol Norton is a 74 y.o. female present for follow-up on chronic conditions  Anxiety:  Patient reports she is doing well on Xanax 0.5 mg twice daily. She has been unable to tolerate citalopram, Lexapro or Wellbutrin in the past.  She has many reactions to different types of medications.   She does have a controlled substance contract. Patient has been counseled on the addictive properties of benzodiazepines. She has been tried on other medications with side effects.   Hypothyroid: Patient reports compliance with Armour Thyroid 60 mg daily.  She is due for lab work today.  Hyperlipidemia: Patient has had elevated cholesterol.  She is intolerant to statins.  Depression screen [facility] 2/9 02/13/2019 10/04/2018 10/12/2017 09/23/2016 05/26/2016  Decreased Interest 1 0 0 0 0  Down, Depressed, Hopeless 1 1 0 0 0  PHQ - 2 Score 2 1 0 0 0  Altered sleeping 1 0 - - -  Tired, decreased energy 1 0 - - -  Change in appetite 1 0 - -  -  Feeling bad or failure about yourself  0 0 - - -  Trouble concentrating 1 0 - - -  Moving slowly or fidgety/restless 0 0 - - -  Suicidal thoughts 0 0 - - -  PHQ-9 Score 6 1 - - -  Difficult doing work/chores Somewhat difficult Not difficult at all - - -   GAD 7 : Generalized Anxiety Score 02/13/2019 10/04/2018 01/11/2017 09/23/2016  Nervous, Anxious, on Edge 2 0 1 2  Control/stop worrying 2 0 2 2  Worry too much - different things _0 Trouble relaxing 1 0 1 1  Restless 0 0 1 1  Easily annoyed or irritable 0 0 0 0  Afraid - awful might happen 2 1 0 1  Total GAD 7 Score _1 Anxiety Difficulty Somewhat difficult Not difficult at all Somewhat difficult Somewhat difficult   Allergies  Allergen Reactions  . Synthroid [Levothyroxine Sodium] Shortness Of Breath  . Accolate [Zafirlukast]   . Avelox [Moxifloxacin Hcl In Nacl]   . Citalopram   . Crestor [Rosuvastatin]   . Dexilant [Dexlansoprazole]   . Escitalopram Oxalate   . Nsaids     Hair loss  . Singulair [Montelukast Sodium]     Aggression,anger  . Statins     Myalgias, joint pain.  . Wellbutrin [Bupropion]   . Dymista [Azelastine-Fluticasone]   . Simvastatin Palpitations  Social History   Tobacco Use  . Smoking status: Former Smoker    Packs/day: 0.75    Years: 25.00    Pack years: 18.75    Types: Cigarettes    Quit date: 05/25/2007    Years since quitting: 12.4  . Smokeless tobacco: Never Used  . Tobacco comment: quit for 10 years in the total 75yr.   Substance Use Topics  . Alcohol use: No    Comment: rare   Past Medical History:  Diagnosis Date  . Allergic rhinitis   . Allergy   . Anxiety   . Bilateral carpal tunnel syndrome    Patient scheduled for surgery with neurology  . Cataract   . Chronic constipation   . Chronic headache   . Chronic sinusitis   . CN (constipation) 12/17/2014  . COVID-19 virus infection 05/2019  . GERD (gastroesophageal reflux disease)   . Glaucoma   . Hyperlipemia     . Hypothyroid   . Leg length discrepancy   . Ocular rosacea   . Plantar fasciitis   . Sebaceous cyst   . Sleep apnea    on CPAP  . Urethral stenosis   . Vitamin D deficiency    Past Surgical History:  Procedure Laterality Date  . ABDOMINAL HYSTERECTOMY  1983  . BREAST SURGERY     sebaceous cyst  . EYE SURGERY     surgery with laser  . FACIAL COSMETIC SURGERY     1995  . GANGLION CYST EXCISION    . MENISCUS REPAIR Left   . RHINOPLASTY    . SEPTOPLASTY    . TUBAL LIGATION     Family History  Problem Relation Age of Onset  . Emphysema Father   . Alcohol abuse Father   . Allergies Mother   . Heart disease Mother   . Ovarian cancer Mother   . Hyperlipidemia Mother   . Hypothyroidism Mother   . Arthritis Mother   . Hypothyroidism Sister   . Lung cancer Paternal Aunt   . Diabetes Maternal Grandmother   . Heart disease Maternal Grandmother   . Stroke Paternal Grandfather    Allergies as of 11/02/2019      Reactions   Synthroid [levothyroxine Sodium] Shortness Of Breath   Accolate [zafirlukast]    Avelox [moxifloxacin Hcl In Nacl]    Citalopram    Crestor [rosuvastatin]    Dexilant [dexlansoprazole]    Escitalopram Oxalate    Nsaids    Hair loss   Singulair [montelukast Sodium]    Aggression,anger   Statins    Myalgias, joint pain.   Wellbutrin [bupropion]    Dymista [azelastine-fluticasone]    Simvastatin Palpitations      Medication List       Accurate as of November 02, 2019 11:59 PM. If you have any questions, ask your nurse or doctor.        STOP taking these medications   doxycycline 100 MG capsule Commonly known as: MONODOX Stopped by: RHoward Pouch DO   Lysine HCl 1000 MG Tabs Stopped by: RHoward Pouch DO   triamcinolone cream 0.1 % Commonly known as: KENALOG Stopped by: RHoward Pouch DO   valACYclovir 1000 MG tablet Commonly known as: VALTREX Stopped by: RHoward Pouch DO   Zinc 100 MG Tabs Stopped by: RHoward Pouch DO     TAKE these  medications   ALIGN PO Take by mouth daily.   ALPRAZolam 0.5 MG tablet Commonly known as: XANAX Take 1 tablet (0.5 mg total)  by mouth 2 (two) times daily.   Armour Thyroid 60 MG tablet Generic drug: thyroid Take one tablet daily   diclofenac sodium 1 % Gel Commonly known as: VOLTAREN diclofenac 1 % topical gel  APPLY 2 GRAM TO THE AFFECTED AREA(S) BY TOPICAL ROUTE 2-3 TIMES PER DAY   Magnesium 100 MG Caps Take 800 mg by mouth.   methocarbamol 500 MG tablet Commonly known as: ROBAXIN Take 500 mg by mouth 3 (three) times daily as needed.   Olopatadine HCl 0.6 % Soln Place 2 sprays into both nostrils 2 (two) times daily.   Quercetin 250 MG Tabs Take 1 tablet by mouth daily.   traMADol 50 MG tablet Commonly known as: ULTRAM tramadol 50 mg tablet  TAKE 1 TABLET BY MOUTH EVERY 6 HOURS AS NEEDED   Tylenol 8 Hour Arthritis Pain 650 MG CR tablet Generic drug: acetaminophen Take 650 mg by mouth every 8 (eight) hours as needed for pain.   vitamin B-6 250 MG tablet Take 750 mg by mouth daily.   Vitamin D 125 MCG (5000 UT) Caps Take 2 capsules by mouth daily.       All past medical history, surgical history, allergies, family history, immunizations andmedications were updated in the EMR today and reviewed under the history and medication portions of their EMR.     ROS: Negative, with the exception of above mentioned in HPI   Objective:  BP 126/87 (BP Location: Right Arm, Patient Position: Sitting, Cuff Size: Normal)   Pulse 99   Temp 98.2 F (36.8 C) (Temporal)   Resp 18   Ht _0  (1.702 m)   Wt 197 lb (89.4 kg)   SpO2 96%   BMI 30.85 kg/m  Body mass index is 30.85 kg/m. Gen: Afebrile. No acute distress.  Nontoxic in presentation. HENT: AT. [place]. Eyes:Pupils Equal Round Reactive to light, Extraocular movements intact,  Conjunctiva without redness, discharge or icterus. Neck/lymp/endocrine: Supple, no lymphadenopathy, no thyromegaly CV: RRR no murmur, no edema,  +2/4 P posterior tibialis pulses Chest: CTAB, no wheeze or crackles.  Neuro:  Normal gait. PERLA. EOMi. Alert. Oriented x3 Psych: Normal affect, dress and demeanor. Normal speech. Normal thought content and judgment.   No exam data present No results found. No results found for this or any previous visit (from the past 24 hour(s)).  Assessment/Plan: Mindi Akerson is a 74 y.o. female present for OV for  Generalized anxiety disorder/chronic benzodiazepine use/benzo agreement exists -Stable.  Continue Xanax 0.5 mg twice daily as needed  -Patient has been  counseled on addictive properties of benzodiazepines as well as side effect profile in geriatric population.  We did discuss considering changing to Ativan in the future and in length we discussed why this is safer for her.  She still has many concerns and worries about changing medication to something she is not had before in case it does not work or she has a side effect. - Controlled substance contract in place - [place] controlled substance database has been reviewed 11/02/2019 appropriate,  made part of her permanent chart. Patient is aware that she will need to be seen every 6 months face-to-face with this provider for refills on this medication.  - UDS collected. Follow-up in 6 months or in October prior to leaving for [place].  Hyperlipidemia/statin intolerance Lipid collected today Would consider Zetia or Praluent if patient is willing.  Hypothyroidism: TSH, T4 collected today. Currently prescribed Armour Thyroid 60 mg.  Will refill medication once lab results are  received an appropriate dose.    Reviewed expectations re: course of current medical issues.  Discussed self-management of symptoms.  Outlined signs and symptoms indicating need for more acute intervention.  Patient verbalized understanding and all questions were answered.  Patient received an After-Visit Summary.    Orders Placed This Encounter    Procedures  . DRUG MONITORING, PANEL 8 WITH CONFIRMATION, URINE  . CBC  . Comp Met (CMET)  . Lipid panel  . T4, free  . TSH  . DM TEMPLATE   Meds ordered this encounter  Medications  . ALPRAZolam (XANAX) 0.5 MG tablet    Sig: Take 1 tablet (0.5 mg total) by mouth 2 (two) times daily.    Dispense:  180 tablet    Refill:  1      Note is dictated utilizing voice recognition software. Although note has been proof read prior to signing, occasional typographical errors still can be missed. If any questions arise, please do not hesitate to call for verification.   electronically signed by:  Howard Pouch, DO  [place]

## 2019-11-02 NOTE — Patient Instructions (Addendum)
October for follow up before ou leave for [place].  We will call you with labs.   I have refilled your xanax and will call in your thyroid med after we get your lab results

## 2019-11-02 NOTE — Progress Notes (Signed)
x1 attempt to get pts blood for labs and she refused to allow me to try again. Pt was very anxious/nervous. Labs were changed to future and she was scheduled to come next week. Pt will collect urine today.

## 2019-11-04 LAB — DRUG MONITORING, PANEL 8 WITH CONFIRMATION, URINE
6 Acetylmorphine: NEGATIVE ng/mL (ref <10)
Alcohol Metabolites: NEGATIVE ng/mL
Alphahydroxyalprazolam: 114 ng/mL — ABNORMAL HIGH (ref <25)
Alphahydroxymidazolam: NEGATIVE ng/mL (ref <50)
Alphahydroxytriazolam: NEGATIVE ng/mL (ref <50)
Aminoclonazepam: NEGATIVE ng/mL (ref <25)
Amphetamines: NEGATIVE ng/mL (ref <500)
Benzodiazepines: POSITIVE ng/mL — AB (ref <100)
Buprenorphine, Urine: NEGATIVE ng/mL (ref <5)
Cocaine Metabolite: NEGATIVE ng/mL (ref <150)
Creatinine: 139.9 mg/dL
Hydroxyethylflurazepam: NEGATIVE ng/mL (ref <50)
Lorazepam: NEGATIVE ng/mL (ref <50)
MDMA: NEGATIVE ng/mL (ref <500)
Marijuana Metabolite: NEGATIVE ng/mL (ref <20)
Nordiazepam: NEGATIVE ng/mL (ref <50)
Opiates: NEGATIVE ng/mL (ref <100)
Oxazepam: NEGATIVE ng/mL (ref <50)
Oxidant: NEGATIVE ug/mL
Oxycodone: NEGATIVE ng/mL (ref <100)
Temazepam: NEGATIVE ng/mL (ref <50)
pH: 6.7 (ref 4.5–9.0)

## 2019-11-04 LAB — DM TEMPLATE

## 2019-11-06 ENCOUNTER — Ambulatory Visit (INDEPENDENT_AMBULATORY_CARE_PROVIDER_SITE_OTHER): Payer: Medicare Other | Admitting: Family Medicine

## 2019-11-06 ENCOUNTER — Other Ambulatory Visit: Payer: Self-pay

## 2019-11-06 DIAGNOSIS — E039 Hypothyroidism, unspecified: Secondary | ICD-10-CM

## 2019-11-06 DIAGNOSIS — E782 Mixed hyperlipidemia: Secondary | ICD-10-CM

## 2019-11-07 ENCOUNTER — Telehealth: Payer: Self-pay | Admitting: Family Medicine

## 2019-11-07 LAB — LIPID PANEL
Cholesterol: 227 mg/dL — ABNORMAL HIGH (ref <200)
HDL: 72 mg/dL (ref >=50)
LDL Cholesterol (Calc): 135 mg/dL (calc) — ABNORMAL HIGH
Non-HDL Cholesterol (Calc): 155 mg/dL (calc) — ABNORMAL HIGH (ref <130)
Total CHOL/HDL Ratio: 3.2 (calc) (ref <5.0)
Triglycerides: 98 mg/dL (ref <150)

## 2019-11-07 LAB — COMPREHENSIVE METABOLIC PANEL
AG Ratio: 1.5 (calc) (ref 1.0–2.5)
ALT: 15 U/L (ref 6–29)
AST: 17 U/L (ref 10–35)
Albumin: 3.8 g/dL (ref 3.6–5.1)
Alkaline phosphatase (APISO): 106 U/L (ref 37–153)
BUN: 16 mg/dL (ref 7–25)
CO2: 25 mmol/L (ref 20–32)
Calcium: 9.3 mg/dL (ref 8.6–10.4)
Chloride: 106 mmol/L (ref 98–110)
Creat: 0.86 mg/dL (ref 0.60–0.93)
Globulin: 2.6 g/dL (calc) (ref 1.9–3.7)
Glucose, Bld: 103 mg/dL — ABNORMAL HIGH (ref 65–99)
Potassium: 4.4 mmol/L (ref 3.5–5.3)
Sodium: 141 mmol/L (ref 135–146)
Total Bilirubin: 0.4 mg/dL (ref 0.2–1.2)
Total Protein: 6.4 g/dL (ref 6.1–8.1)

## 2019-11-07 LAB — CBC
HCT: 40.3 % (ref 35.0–45.0)
Hemoglobin: 13.5 g/dL (ref 11.7–15.5)
MCH: 31 pg (ref 27.0–33.0)
MCHC: 33.5 g/dL (ref 32.0–36.0)
MCV: 92.6 fL (ref 80.0–100.0)
MPV: 11.6 fL (ref 7.5–12.5)
Platelets: 232 10*3/uL (ref 140–400)
RBC: 4.35 10*6/uL (ref 3.80–5.10)
RDW: 13 % (ref 11.0–15.0)
WBC: 7.7 10*3/uL (ref 3.8–10.8)

## 2019-11-07 LAB — T4, FREE: Free T4: 0.8 ng/dL (ref 0.8–1.8)

## 2019-11-07 LAB — TSH: TSH: 1.37 mIU/L (ref 0.40–4.50)

## 2019-11-07 MED ORDER — ARMOUR THYROID 60 MG PO TABS: ORAL_TABLET | ORAL | 3 refills | Status: DC

## 2019-11-07 NOTE — Telephone Encounter (Signed)
Pt was called and given lab results, she verbalized understanding.  

## 2019-11-07 NOTE — Telephone Encounter (Signed)
Pt was called and message was left to return call  

## 2019-11-07 NOTE — Telephone Encounter (Signed)
Please inform patient the following information: Blood cell counts are normal. Cholesterol panel looks good with a total cholesterol 227, HDL 72, LDL 135 and triglycerides 98. Liver, kidney and thyroid functions are normal.>  I have refilled her thyroid supplement for 1 year.  Follow-up just prior to her leaving for [place] in the fall, for 5.5 months-whichever comes first.

## 2019-12-05 ENCOUNTER — Encounter: Payer: Self-pay | Admitting: Family Medicine

## 2019-12-31 ENCOUNTER — Encounter: Payer: Self-pay | Admitting: Physician Assistant

## 2019-12-31 ENCOUNTER — Other Ambulatory Visit: Payer: Self-pay

## 2019-12-31 ENCOUNTER — Telehealth (INDEPENDENT_AMBULATORY_CARE_PROVIDER_SITE_OTHER): Payer: Medicare Other | Admitting: Physician Assistant

## 2019-12-31 DIAGNOSIS — J0100 Acute maxillary sinusitis, unspecified: Secondary | ICD-10-CM | POA: Diagnosis not present

## 2019-12-31 MED ORDER — AMOXICILLIN-POT CLAVULANATE 875-125 MG PO TABS: 1.0000 | ORAL_TABLET | Freq: Two times a day (BID) | ORAL | 0 refills | Status: DC

## 2019-12-31 NOTE — Progress Notes (Signed)
I have discussed the procedure for the virtual visit with the patient who has given consent to proceed with assessment and treatment.   Nohea Kras S Tara Wich, CMA     

## 2019-12-31 NOTE — Progress Notes (Signed)
Virtual Visit via Video   I connected with patient on 12/31/19 at  4:00 PM EDT by a video enabled telemedicine application and verified that I am speaking with the correct person using two identifiers.  Location patient: Home Location provider: [facility], Office Persons participating in the virtual visit: Patient, Provider, [place] (Patina Moore)  I discussed the limitations of evaluation and management by telemedicine and the availability of in person appointments. The patient expressed understanding and agreed to proceed.  Subjective:   HPI:   Patient presents via [facility] today c/o several days of bilateral ear pressure and popping, now with L maxillary sinus pain. Some feeling off off-balance when leaning forward. Denies fever or chills.  Denies chest congestion. Occasional dry cough. Denies recent travel or sick contact.  Takes her Olopatadine nasal spray but only rarely. Just restarted last night. Recently on Doxycycline for dental abscess within the past 6-8 weeks. Had tooth extraction with bone graft -- 2 weeks ago. Has done very well. Has not taken anything for her symptoms.   ROS:   See pertinent positives and negatives per HPI.  Patient Active Problem List   Diagnosis Date Noted  . Tear of lateral meniscus of knee 02/17/2019  . Statin intolerance 10/04/2018  . Benzodiazepine use agreement exists 02/10/2018  . Carotid atherosclerosis, left 10/14/2017  . Disorder of both mastoids 10/12/2017  . Gastroesophageal reflux disease 01/11/2017  . 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 Generalized anxiety disorder 05/13/2016  . Obesity (BMI 30.0-34.9) 05/13/2016  . Mixed hyperlipidemia 05/13/2016  . 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 Vitamin D deficiency 02/25/2015  . Foraminal stenosis of cervical region 02/20/2015  . Acquired hypothyroidism 12/17/2014  . Glaucoma 12/17/2014  . Chronic sinusitis 12/17/2014  . Ocular rosacea 12/17/2014  . OSA  (obstructive sleep apnea) 10/06/2012    Social History   Tobacco Use  . Smoking status: Former Smoker    Packs/day: 0.75    Years: 25.00    Pack years: 18.75    Types: Cigarettes    Quit date: 05/25/2007    Years since quitting: 12.6  . Smokeless tobacco: Never Used  . Tobacco comment: quit for 10 years in the total 4yrs.   Substance Use Topics  . Alcohol use: No    Comment: rare    Current Outpatient Medications:  .  acetaminophen (TYLENOL 8 HOUR ARTHRITIS PAIN) 650 MG CR tablet, Take 650 mg by mouth every 8 (eight) hours as needed for pain., Disp: , Rfl:  .  ALPRAZolam (XANAX) 0.5 MG tablet, Take 1 tablet (0.5 mg total) by mouth 2 (two) times daily., Disp: 180 tablet, Rfl: 1 .  ARMOUR THYROID 60 MG tablet, Take one tablet daily, Disp: 90 tablet, Rfl: 3 .  Cholecalciferol (VITAMIN D) 125 MCG (5000 UT) CAPS, Take 2 capsules by mouth daily., Disp: , Rfl:  .  diclofenac sodium (VOLTAREN) 1 % GEL, diclofenac 1 % topical gel  APPLY 2 GRAM TO THE AFFECTED AREA(S) BY TOPICAL ROUTE 2-3 TIMES PER DAY, Disp: , Rfl:  .  Magnesium 100 MG CAPS, Take 800 mg by mouth. , Disp: , Rfl:  .  methocarbamol (ROBAXIN) 500 MG tablet, Take 500 mg by mouth 3 (three) times daily as needed., Disp: , Rfl:  .  Olopatadine HCl 0.6 % SOLN, Place 2 sprays into both nostrils 2 (two) times daily., Disp: 30.5 g, Rfl: 5 .  Probiotic Product (ALIGN PO), Take by mouth daily., Disp: , Rfl:  .  Pyridoxine HCl (  VITAMIN B-6) 250 MG tablet, Take 750 mg by mouth daily., Disp: , Rfl:  .  Quercetin 250 MG TABS, Take 1 tablet by mouth daily., Disp: , Rfl:  .  traMADol (ULTRAM) 50 MG tablet, tramadol 50 mg tablet  TAKE 1 TABLET BY MOUTH EVERY 6 HOURS AS NEEDED, Disp: , Rfl:   Allergies  Allergen Reactions  . Synthroid [Levothyroxine Sodium] Shortness Of Breath  . Levothyroxine Other (See Comments)  . Accolate [Zafirlukast]   . Avelox [Moxifloxacin Hcl In Nacl]   . Citalopram   . Crestor [Rosuvastatin]   . Dexilant  [Dexlansoprazole]   . Escitalopram Oxalate   . Nsaids     Hair loss  . Singulair [Montelukast Sodium]     Aggression,anger  . Statins     Myalgias, joint pain.  . Wellbutrin [Bupropion]   . Dymista [Azelastine-Fluticasone]   . Simvastatin Palpitations    Objective:   There were no vitals taken for this visit.  Patient is well-developed, well-nourished in no acute distress.  Resting comfortably at home.  Head is normocephalic, atraumatic.  No labored breathing.  Speech is clear and coherent with logical content.  Patient is alert and oriented at baseline.   Assessment and Plan:   1. Acute non-recurrent maxillary sinusitis Rx Augmentin.  Increase fluids.  Rest.  Saline nasal spray.  Probiotic.  Mucinex as directed.  Humidifier in bedroom. Continue patanase.  Call or return to clinic if symptoms are not improving.     Leeanne Rio, PA-C 12/31/2019

## 2019-12-31 NOTE — Patient Instructions (Signed)
Instructions sent to MyChart

## 2020-01-08 ENCOUNTER — Telehealth: Payer: Self-pay | Admitting: Family Medicine

## 2020-01-08 NOTE — Telephone Encounter (Signed)
Left message for patient to schedule Annual Wellness Visit.  Please schedule with Nurse Health Advisor Martha Stanley, RN at [facility]  °

## 2020-02-08 ENCOUNTER — Ambulatory Visit: Payer: Medicare Other | Admitting: Family Medicine

## 2020-02-08 ENCOUNTER — Encounter: Payer: Self-pay | Admitting: Family Medicine

## 2020-02-08 ENCOUNTER — Other Ambulatory Visit: Payer: Self-pay

## 2020-02-08 VITALS — BP 117/74 | HR 74 | Temp 98.2°F | Ht 67.0 in | Wt 200.6 lb

## 2020-02-08 DIAGNOSIS — F411 Generalized anxiety disorder: Secondary | ICD-10-CM

## 2020-02-08 MED ORDER — ALPRAZOLAM 0.5 MG PO TABS: 0.5000 mg | ORAL_TABLET | Freq: Two times a day (BID) | ORAL | 1 refills | Status: DC

## 2020-02-08 MED ORDER — OLOPATADINE HCL 0.6 % NA SOLN
2.0000 | Freq: Two times a day (BID) | NASAL | 5 refills | Status: DC
Start: 2020-02-08 — End: 2020-12-29

## 2020-02-08 NOTE — Patient Instructions (Addendum)
[place]! We will see when you get back 5.5 months or when needing the refill.

## 2020-02-08 NOTE — Progress Notes (Signed)
Carol Norton , 12/16/1945, 74 y.o., female MRN: [id number] Patient Care Team    Relationship Specialty Notifications Start End  [name], DO PCP - General Family Medicine  05/26/16   [name], MD Consulting Physician Orthopedic Surgery  03/11/16   [name], NP Nurse Practitioner Nurse Practitioner  05/26/16   [name], MD Consulting Physician Internal Medicine  05/26/16    Comment: endocrine, thyroid  [name], [name], MD Referring Physician Pulmonary Disease  05/26/16   [name], MD Consulting Physician Neurology  05/26/16   [name], MD Consulting Physician Cardiology  05/26/16   [name], MD Consulting Physician Ophthalmology  05/26/16    Comment: Dr Kathlen Mody at [facility] for glaucoma and cataracts every 3 mos  [name], MD Attending Physician Family Medicine  05/26/16    Comment: [facility]    Chief Complaint  Patient presents with  . Anxiety    med refill; no complaints on meds     Subjective: Carol Norton is a 74 y.o. female present for follow-up on chronic conditions Anxiety:  Patient reports she is  Doing well on Xanax 0.5 mg twice daily. She has been unable to tolerate citalopram, Lexapro or Wellbutrin in the past.  She has many reactions to different types of medications.   She does have a controlled substance contract. Patient has been counseled on the addictive properties of benzodiazepines. She has been tried on other medications with side effects.   documentation only:  Hypothyroid: Patient reports compliance with Armour Thyroid 60 mg daily.    Hyperlipidemia: Patient has had elevated cholesterol.  She is intolerant to statins.  Depression screen [facility] 2/9 02/13/2019 10/04/2018 10/12/2017 09/23/2016 05/26/2016  Decreased Interest 1 0 0 0 0  Down, Depressed, Hopeless 1 1 0 0 0  PHQ - 2 Score 2 1 0 0 0  Altered sleeping 1 0 - - -  Tired, decreased energy 1 0 - - -  Change in appetite 1 0 - - -  Feeling bad or failure about yourself   0 0 - - -  Trouble concentrating 1 0 - - -  Moving slowly or fidgety/restless 0 0 - - -  Suicidal thoughts 0 0 - - -  PHQ-9 Score 6 1 - - -  Difficult doing work/chores Somewhat difficult Not difficult at all - - -   GAD 7 : Generalized Anxiety Score 02/13/2019 10/04/2018 01/11/2017 09/23/2016  Nervous, Anxious, on Edge 2 0 1 2  Control/stop worrying 2 0 2 2  Worry too much - different things 2 1 1 2   Trouble relaxing 1 0 1 1  Restless 0 0 1 1  Easily annoyed or irritable 0 0 0 0  Afraid - awful might happen 2 1 0 1  Total GAD 7 Score 9 2 6 9   Anxiety Difficulty Somewhat difficult Not difficult at all Somewhat difficult Somewhat difficult   Allergies  Allergen Reactions  . Synthroid [Levothyroxine Sodium] Shortness Of Breath  . Levothyroxine Other (See Comments)  . Accolate [Zafirlukast]   . Avelox [Moxifloxacin Hcl In Nacl]   . Citalopram   . Crestor [Rosuvastatin]   . Dexilant [Dexlansoprazole]   . Escitalopram Oxalate   . Nsaids     Hair loss  . Singulair [Montelukast Sodium]     Aggression,anger  . Statins     Myalgias, joint pain.  . Wellbutrin [Bupropion]   . Dymista [Azelastine-Fluticasone]   . Simvastatin Palpitations   Social History  Tobacco Use  . Smoking status: Former Smoker    Packs/day: 0.75    Years: 25.00    Pack years: 18.75    Types: Cigarettes    Quit date: 05/25/2007    Years since quitting: 12.7  . Smokeless tobacco: Never Used  . Tobacco comment: quit for 10 years in the total 43yrs.   Substance Use Topics  . Alcohol use: No    Comment: rare   Past Medical History:  Diagnosis Date  . Allergic rhinitis   . Allergy   . Anxiety   . Bilateral carpal tunnel syndrome    Patient scheduled for surgery with neurology  . Cataract   . Chronic constipation   . Chronic headache   . Chronic sinusitis   . CN (constipation) 12/17/2014  . COVID-19 virus infection 05/2019  . GERD (gastroesophageal reflux disease)   . Glaucoma   . Hyperlipemia   .  Hypothyroid   . Leg length discrepancy   . Ocular rosacea   . Plantar fasciitis   . Sebaceous cyst   . Sleep apnea    on CPAP  . Urethral stenosis   . Vitamin D deficiency    Past Surgical History:  Procedure Laterality Date  . ABDOMINAL HYSTERECTOMY  1983  . BREAST SURGERY     sebaceous cyst  . EYE SURGERY     surgery with laser  . FACIAL COSMETIC SURGERY     1995  . GANGLION CYST EXCISION    . MENISCUS REPAIR Left   . RHINOPLASTY    . SEPTOPLASTY    . TUBAL LIGATION     Family History  Problem Relation Age of Onset  . Emphysema Father   . Alcohol abuse Father   . Allergies Mother   . Heart disease Mother   . Ovarian cancer Mother   . Hyperlipidemia Mother   . Hypothyroidism Mother   . Arthritis Mother   . Hypothyroidism Sister   . Lung cancer Paternal Aunt   . Diabetes Maternal Grandmother   . Heart disease Maternal Grandmother   . Stroke Paternal Grandfather    Allergies as of 02/08/2020      Reactions   Synthroid [levothyroxine Sodium] Shortness Of Breath   Levothyroxine Other (See Comments)   Accolate [zafirlukast]    Avelox [moxifloxacin Hcl In Nacl]    Citalopram    Crestor [rosuvastatin]    Dexilant [dexlansoprazole]    Escitalopram Oxalate    Nsaids    Hair loss   Singulair [montelukast Sodium]    Aggression,anger   Statins    Myalgias, joint pain.   Wellbutrin [bupropion]    Dymista [azelastine-fluticasone]    Simvastatin Palpitations      Medication List       Accurate as of February 08, 2020 11:37 AM. If you have any questions, ask your nurse or doctor.        STOP taking these medications   amoxicillin-clavulanate 875-125 MG tablet Commonly known as: AUGMENTIN Stopped by: Howard Pouch, DO   doxycycline 100 MG tablet Commonly known as: VIBRA-TABS Stopped by: Howard Pouch, DO     TAKE these medications   ALIGN PO Take by mouth daily.   ALPRAZolam 0.5 MG tablet Commonly known as: XANAX Take 1 tablet (0.5 mg total) by mouth  2 (two) times daily.   Armour Thyroid 60 MG tablet Generic drug: thyroid Take one tablet daily   diclofenac sodium 1 % Gel Commonly known as: VOLTAREN diclofenac 1 % topical gel  APPLY 2 GRAM TO THE AFFECTED AREA(S) BY TOPICAL ROUTE 2-3 TIMES PER DAY   ICAPS AREDS 2 PO Take by mouth.   Magnesium 100 MG Caps Take 800 mg by mouth.   Maxitrol 0.1 % Oint Generic drug: neomycin-polymyxin-dexameth Apply 1 a small amount into both eyes at bedtime   methocarbamol 500 MG tablet Commonly known as: ROBAXIN Take 500 mg by mouth 3 (three) times daily as needed.   Olopatadine HCl 0.6 % Soln Place 2 sprays into both nostrils 2 (two) times daily.   Quercetin 250 MG Tabs Take 1 tablet by mouth daily.   traMADol 50 MG tablet Commonly known as: ULTRAM tramadol 50 mg tablet  TAKE 1 TABLET BY MOUTH EVERY 6 HOURS AS NEEDED   Tylenol 8 Hour Arthritis Pain 650 MG CR tablet Generic drug: acetaminophen Take 650 mg by mouth every 8 (eight) hours as needed for pain.   vitamin B-6 250 MG tablet Take 750 mg by mouth daily.   Vitamin D 125 MCG (5000 UT) Caps Take 2 capsules by mouth daily.       All past medical history, surgical history, allergies, family history, immunizations andmedications were updated in the EMR today and reviewed under the history and medication portions of their EMR.     ROS: Negative, with the exception of above mentioned in HPI   Objective:  BP 117/74   Pulse 74   Temp 98.2 F (36.8 C) (Oral)   Ht 5\' 7"  (1.702 m)   Wt 200 lb 9.6 oz (91 kg)   SpO2 97%   BMI 31.42 kg/m  Body mass index is 31.42 kg/m. Gen: Afebrile. No acute distress.  HENT: AT. [place].  Eyes:Pupils Equal Round Reactive to light, Extraocular movements intact,  Conjunctiva without redness, discharge or icterus. CV: RRR  Chest: CTAB, no wheeze or crackles Neuro:  Normal gait. PERLA. EOMi. Alert. Oriented x3 Psych: Normal affect, dress and demeanor. Normal speech. Normal thought content and  judgment.  No exam data present No results found. No results found for this or any previous visit (from the past 24 hour(s)).  Assessment/Plan: Carol Norton is a 74 y.o. female present for OV for  Generalized anxiety disorder/chronic benzodiazepine use/benzo agreement exists - stable.  - Conitnue Xanax 0.5 mg BID as needed  - Patient has been  counseled on addictive properties of benzodiazepines as well as side effect profile in geriatric population.  We did discuss considering changing to Ativan in the future and in length we discussed why this is safer for her.  She still has many concerns and worries about changing medication to something she is not had before in case it does not work or she has a side effect. - Controlled substance contract updated today - [place] controlled substance database has been reviewed 02/08/20  appropriate,  made part of her permanent chart. Patient is aware that she will need to be seen every 5.5 months face-to-face with this provider for refills on this medication. (virtual ok) - UDS UTD Follow-up in 5.5 months  Documentation only:  Hyperlipidemia/statin intolerance Lipid UTD Would consider Zetia or Praluent if patient is willing.   Hypothyroidism: Labs UTD 10/2019 Continue Armour Thyroid 60 mg.       Reviewed expectations re: course of current medical issues.  Discussed self-management of symptoms.  Outlined signs and symptoms indicating need for more acute intervention.  Patient verbalized understanding and all questions were answered.  Patient received an After-Visit Summary.    No orders  of the defined types were placed in this encounter.  Meds ordered this encounter  Medications  . Olopatadine HCl 0.6 % SOLN    Sig: Place 2 sprays into both nostrils 2 (two) times daily.    Dispense:  30.5 g    Refill:  5  . DISCONTD: ALPRAZolam (XANAX) 0.5 MG tablet    Sig: Take 1 tablet (0.5 mg total) by mouth 2 (two) times daily.     Dispense:  180 tablet    Refill:  1  . ALPRAZolam (XANAX) 0.5 MG tablet    Sig: Take 1 tablet (0.5 mg total) by mouth 2 (two) times daily.    Dispense:  180 tablet    Refill:  1      Note is dictated utilizing voice recognition software. Although note has been proof read prior to signing, occasional typographical errors still can be missed. If any questions arise, please do not hesitate to call for verification.   electronically signed by:  Howard Pouch, DO  [place]

## 2020-05-06 ENCOUNTER — Ambulatory Visit: Payer: Medicare Other | Admitting: Family Medicine

## 2020-07-08 ENCOUNTER — Telehealth: Payer: Self-pay | Admitting: Family Medicine

## 2020-07-08 NOTE — Telephone Encounter (Signed)
Spoke patient she stated she was out of town until May 2022, she will call back to schedule when she returns

## 2020-07-28 ENCOUNTER — Telehealth: Payer: Self-pay | Admitting: Family Medicine

## 2020-07-28 NOTE — Telephone Encounter (Signed)
Patient requesting refill of alprazolam, states she has 14 days left. She is currently in [place] until April. Please send to [facility] [place], [place], phone number [phone].

## 2020-07-28 NOTE — Telephone Encounter (Signed)
Requesting: xanax Contract:02/19/20 UDS:11/02/19 Last Visit:02/08/20 Next Visit:n/a pt is out of town until April Last Refill:02/08/20 (180,1)  Please Advise

## 2020-07-28 NOTE — Telephone Encounter (Signed)
Xanax is a controlled substance and cannot be refilled upon request.  It would be a illegal for me to prescribe this medication without a face-to-face visit. (Patient has been told this before). Also, now unfortunately, we are unable to see telehealth patients that are not in the state of [place] at the time of the visit.  Therefore, she will need to establish or be seen in urgent care down in [place] in order to get refills on her medication while there.

## 2020-07-28 NOTE — Telephone Encounter (Signed)
Patient advised and voiced understanding.  

## 2020-08-19 LAB — LIPID PANEL
Cholesterol: 253 — AB (ref 0–200)
HDL: 85 — AB (ref 35–70)
LDL Cholesterol: 153
Triglycerides: 90 (ref 40–160)

## 2020-08-19 LAB — BASIC METABOLIC PANEL
BUN: 20 (ref 4–21)
CO2: 24 — AB (ref 13–22)
Chloride: 104 (ref 99–108)
Creatinine: 1 (ref <=1.1)
Glucose: 111
Potassium: 4.6 (ref 3.4–5.3)
Sodium: 141 (ref 137–147)

## 2020-08-19 LAB — POCT ERYTHROCYTE SEDIMENTATION RATE, NON-AUTOMATED: Sed Rate: 14

## 2020-08-19 LAB — HEPATIC FUNCTION PANEL
ALT: 15 (ref 7–35)
AST: 21 (ref 13–35)
Alkaline Phosphatase: 129 — AB (ref 25–125)
Bilirubin, Total: 0.3

## 2020-08-19 LAB — T4: Thyroxine (T4): 5.3

## 2020-08-19 LAB — VITAMIN D 25 HYDROXY (VIT D DEFICIENCY, FRACTURES): Vit D, 25-Hydroxy: 43.5

## 2020-08-19 LAB — COMPREHENSIVE METABOLIC PANEL
Albumin: 4.2 (ref 3.5–5.0)
Calcium: 9.4 (ref 8.7–10.7)
Globulin: 2.4

## 2020-08-19 LAB — TSH: TSH: 2.44 (ref 0.41–5.90)

## 2020-08-19 LAB — HEMOGLOBIN A1C: Hemoglobin A1C: 5.9

## 2020-09-04 LAB — CBC AND DIFFERENTIAL
HCT: 41 (ref 36–46)
Hemoglobin: 13.5 (ref 12.0–16.0)
Neutrophils Absolute: 2.5
Platelets: 233 (ref 150–399)
WBC: 7

## 2020-09-04 LAB — CBC: RBC: 4.58 (ref 3.87–5.11)

## 2020-09-30 ENCOUNTER — Ambulatory Visit: Payer: Medicare Other | Admitting: Internal Medicine

## 2020-11-10 HISTORY — PX: DENTAL SURGERY: SHX609

## 2020-11-27 ENCOUNTER — Encounter: Payer: Self-pay | Admitting: Family Medicine

## 2020-11-27 ENCOUNTER — Other Ambulatory Visit: Payer: Self-pay

## 2020-11-27 ENCOUNTER — Ambulatory Visit: Payer: Medicare Other | Admitting: Family Medicine

## 2020-11-27 VITALS — BP 140/84 | HR 72 | Temp 98.0°F | Ht 67.0 in | Wt 196.0 lb

## 2020-11-27 DIAGNOSIS — E782 Mixed hyperlipidemia: Secondary | ICD-10-CM

## 2020-11-27 DIAGNOSIS — E559 Vitamin D deficiency, unspecified: Secondary | ICD-10-CM | POA: Diagnosis not present

## 2020-11-27 DIAGNOSIS — Z79899 Other long term (current) drug therapy: Secondary | ICD-10-CM

## 2020-11-27 DIAGNOSIS — E669 Obesity, unspecified: Secondary | ICD-10-CM | POA: Diagnosis not present

## 2020-11-27 DIAGNOSIS — F1310 Sedative, hypnotic or anxiolytic abuse, uncomplicated: Secondary | ICD-10-CM | POA: Diagnosis not present

## 2020-11-27 DIAGNOSIS — Z789 Other specified health status: Secondary | ICD-10-CM

## 2020-11-27 DIAGNOSIS — F411 Generalized anxiety disorder: Secondary | ICD-10-CM | POA: Diagnosis not present

## 2020-11-27 MED ORDER — ALPRAZOLAM 0.5 MG PO TABS: 0.5000 mg | ORAL_TABLET | Freq: Two times a day (BID) | ORAL | 0 refills | Status: DC

## 2020-11-27 MED ORDER — ARMOUR THYROID 60 MG PO TABS: ORAL_TABLET | ORAL | 3 refills | Status: DC

## 2020-11-27 NOTE — Patient Instructions (Signed)
Great to see you today.  I have refilled the medication(s) we provide.   If labs were collected, we will inform you of lab results once received either by echart message or telephone call.   - echart message- for normal results that have been seen by the patient already.   - telephone call: abnormal results or if patient has not viewed results in their echart.   Preventing High Cholesterol Cholesterol is a white, waxy substance similar to fat that the human body needs to help build cells. The liver makes all the cholesterol that a person's body needs. Having high cholesterol (hypercholesterolemia) increases your risk for heart disease and stroke. Extra or excess cholesterolcomes from the food that you eat. High cholesterol can often be prevented with diet and lifestyle changes. If you already have high cholesterol, you can control it with diet, lifestyle changes,and medicines. How can high cholesterol affect me? If you have high cholesterol, fatty deposits (plaques) may build up on the walls of your blood vessels. The blood vessels that carry blood away from your heart are called arteries. Plaques make the arteries narrower and stiffer. This in turn can: Restrict or block blood flow and cause blood clots to form. Increase your risk for heart attack and stroke. What can increase my risk for high cholesterol? This condition is more likely to develop in people who: Eat foods that are high in saturated fat or cholesterol. Saturated fat is mostly found in foods that come from animal sources. Are overweight. Are not getting enough exercise. Have a family history of high cholesterol (familial hypercholesterolemia). What actions can I take to prevent this? Nutrition  Eat less saturated fat. Avoid trans fats (partially hydrogenated oils). These are often found in margarine and in some baked goods, fried foods, and snacks bought in packages. Avoid precooked or cured meat, such as bacon, sausages, or  meat loaves. Avoid foods and drinks that have added sugars. Eat more fruits, vegetables, and whole grains. Choose healthy sources of protein, such as fish, poultry, lean cuts of red meat, beans, peas, lentils, and nuts. Choose healthy sources of fat, such as: Nuts. Vegetable oils, especially olive oil. Fish that have healthy fats, such as omega-3 fatty acids. These fish include mackerel or salmon.  Lifestyle Lose weight if you are overweight. Maintaining a healthy body mass index (BMI) can help prevent or control high cholesterol. It can also lower your risk for diabetes and high blood pressure. Ask your health care provider to help you with a diet and exercise plan to lose weight safely. Do not use any products that contain nicotine or tobacco, such as cigarettes, e-cigarettes, and chewing tobacco. If you need help quitting, ask your health care provider. Alcohol use Do not drink alcohol if: Your health care provider tells you not to drink. You are pregnant, may be pregnant, or are planning to become pregnant. If you drink alcohol: Limit how much you use to: 0-1 drink a day for women. 0-2 drinks a day for men. Be aware of how much alcohol is in your drink. In the [place] one drink equals one 12 oz bottle of beer (355 mL), one 5 oz glass of wine (148 mL), or one 1 oz glass of hard liquor (44 mL). Activity  Get enough exercise. Do exercises as told by your health care provider. Each week, do at least 150 minutes of exercise that takes a medium level of effort (moderate-intensity exercise). This kind of exercise: Makes your heart beat faster while   allowing you to still be able to talk. Can be done in short sessions several times a day or longer sessions a few times a week. For example, on 5 days each week, you could walk fast or ride your bike 3 times a day for 10 minutes each time.  Medicines Your health care provider may recommend medicines to help lower cholesterol. This may be a  medicine to lower the amount of cholesterol that your liver makes. You may need medicine if: Diet and lifestyle changes have not lowered your cholesterol enough. You have high cholesterol and other risk factors for heart disease or stroke. Take over-the-counter and prescription medicines only as told by your health care provider. General information Manage your risk factors for high cholesterol. Talk with your health care provider about all your risk factors and how to lower your risk. Manage other conditions that you have, such as diabetes or high blood pressure (hypertension). Have blood tests to check your cholesterol levels at regular points in time as told by your health care provider. Keep all follow-up visits as told by your health care provider. This is important. Where to find more information American Heart Association: www.heart.org National Heart, Lung, and Blood Institute: www.nhlbi.nih.gov Summary High cholesterol increases your risk for heart disease and stroke. By keeping your cholesterol level low, you can reduce your risk for these conditions. High cholesterol can often be prevented with diet and lifestyle changes. Work with your health care provider to manage your risk factors, and have your blood tested regularly. This information is not intended to replace advice given to you by your health care provider. Make sure you discuss any questions you have with your healthcare provider. Document Revised: 02/20/2019 Document Reviewed: 02/20/2019 Elsevier Patient Education  2022 [employer]  

## 2020-11-27 NOTE — Progress Notes (Signed)
Carol Norton , 1945/05/31, 75 y.o., female MRN: [id number] Patient Care Team    Relationship Specialty Notifications Start End  [name], DO PCP - General Family Medicine  05/26/16   [name], MD Consulting Physician Orthopedic Surgery  03/11/16   [name], NP Nurse Practitioner Nurse Practitioner  05/26/16   [name], MD Consulting Physician Internal Medicine  05/26/16    Comment: endocrine, thyroid  [name], [name], MD Referring Physician Pulmonary Disease  05/26/16   [name], MD Consulting Physician Neurology  05/26/16   [name], MD Consulting Physician Cardiology  05/26/16   [name], MD Consulting Physician Ophthalmology  05/26/16    Comment: Dr Kathlen Mody at [facility] for glaucoma and cataracts every 3 mos  [name], MD Attending Physician Family Medicine  05/26/16    Comment: [facility]    Chief Complaint  Patient presents with   Anxiety    Pt is not fasting     Subjective: Carol Norton is a 75 y.o. female present for follow-up on chronic conditions Anxiety:  Patient reports she is doing very well on her regimen and reports compliance with Xanax 0.5 mg twice daily. She has been unable to tolerate citalopram, Lexapro or Wellbutrin in the past.  She has many reactions to different types of medications.   She does have a controlled substance contract. Patient has been counseled on the addictive properties of benzodiazepines. She has been tried on other medications with side effects.   Hypothyroid: Patient reports compliance with Armour Thyroid 60 mg daily.  She denies any side effects today or worsening palpitations.  She has had labs drawn through the physician she sees when she is in [place].  TSH was normal March 2022.  Hyperlipidemia: Patient has had elevated cholesterol.  She is intolerant to statins.  Her LDL had increased upon review of the [place] labs from March 2022.  LDL now 153.  Depression screen [facility] 2/9 11/27/2020 02/13/2019  10/04/2018 10/12/2017 09/23/2016  Decreased Interest 0 1 0 0 0  Down, Depressed, Hopeless 0 1 1 0 0  PHQ - 2 Score 0 2 1 0 0  Altered sleeping - 1 0 - -  Tired, decreased energy - 1 0 - -  Change in appetite - 1 0 - -  Feeling bad or failure about yourself  - 0 0 - -  Trouble concentrating - 1 0 - -  Moving slowly or fidgety/restless - 0 0 - -  Suicidal thoughts - 0 0 - -  PHQ-9 Score - 6 1 - -  Difficult doing work/chores - Somewhat difficult Not difficult at all - -   GAD 7 : Generalized Anxiety Score 02/13/2019 10/04/2018 01/11/2017 09/23/2016  Nervous, Anxious, on Edge 2 0 1 2  Control/stop worrying 2 0 2 2  Worry too much - different things 2 1 1 2   Trouble relaxing 1 0 1 1  Restless 0 0 1 1  Easily annoyed or irritable 0 0 0 0  Afraid - awful might happen 2 1 0 1  Total GAD 7 Score 9 2 6 9   Anxiety Difficulty Somewhat difficult Not difficult at all Somewhat difficult Somewhat difficult   Allergies  Allergen Reactions   Synthroid [Levothyroxine Sodium] Shortness Of Breath   Levothyroxine Other (See Comments)   Accolate [Zafirlukast]    Avelox [Moxifloxacin Hcl In Nacl]    Citalopram    Crestor [Rosuvastatin]    Dexilant [Dexlansoprazole]    Escitalopram  Oxalate    Nsaids     Hair loss   Singulair [Montelukast Sodium]     Aggression,anger   Statins     Myalgias, joint pain.   Wellbutrin [Bupropion]    Dymista [Azelastine-Fluticasone]    Simvastatin Palpitations   Social History   Tobacco Use   Smoking status: Former    Packs/day: 0.75    Years: 25.00    Pack years: 18.75    Types: Cigarettes    Quit date: 05/25/2007    Years since quitting: 13.5   Smokeless tobacco: Never   Tobacco comments:    quit for 10 years in the total 58yrs.   Substance Use Topics   Alcohol use: No    Comment: rare   Past Medical History:  Diagnosis Date   Allergic rhinitis    Allergy    Anxiety    Bilateral carpal tunnel syndrome    Patient scheduled for surgery with neurology    Cataract    Chronic constipation    Chronic headache    Chronic sinusitis    CN (constipation) 12/17/2014   COVID-19 virus infection 05/2019   GERD (gastroesophageal reflux disease)    Glaucoma    Hyperlipemia    Hypothyroid    Leg length discrepancy    Ocular rosacea    Plantar fasciitis    Sebaceous cyst    Sleep apnea    on CPAP   Urethral stenosis    Vitamin D deficiency    Past Surgical History:  Procedure Laterality Date   ABDOMINAL HYSTERECTOMY  1983   BREAST SURGERY     sebaceous cyst   DENTAL SURGERY  11/10/2020   EYE SURGERY     surgery with laser   FACIAL COSMETIC SURGERY     1995   GANGLION CYST EXCISION     MENISCUS REPAIR Left    RHINOPLASTY     SEPTOPLASTY     TUBAL LIGATION     Family History  Problem Relation Age of Onset   Emphysema Father    Alcohol abuse Father    Allergies Mother    Heart disease Mother    Ovarian cancer Mother    Hyperlipidemia Mother    Hypothyroidism Mother    Arthritis Mother    Hypothyroidism Sister    Lung cancer Paternal Aunt    Diabetes Maternal Grandmother    Heart disease Maternal Grandmother    Stroke Paternal Grandfather    Allergies as of 11/27/2020       Reactions   Synthroid [levothyroxine Sodium] Shortness Of Breath   Levothyroxine Other (See Comments)   Accolate [zafirlukast]    Avelox [moxifloxacin Hcl In Nacl]    Citalopram    Crestor [rosuvastatin]    Dexilant [dexlansoprazole]    Escitalopram Oxalate    Nsaids    Hair loss   Singulair [montelukast Sodium]    Aggression,anger   Statins    Myalgias, joint pain.   Wellbutrin [bupropion]    Dymista [azelastine-fluticasone]    Simvastatin Palpitations        Medication List        Accurate as of November 27, 2020  3:36 PM. If you have any questions, ask your nurse or doctor.          STOP taking these medications    acetaminophen 650 MG CR tablet Commonly known as: TYLENOL Stopped by: Howard Pouch, DO   ALIGN PO Stopped by: Howard Pouch, DO   Maxitrol 0.1 % Oint Generic  drug: neomycin-polymyxin-dexameth Stopped by: Howard Pouch, DO   traMADol 50 MG tablet Commonly known as: ULTRAM Stopped by: Howard Pouch, DO   vitamin B-6 250 MG tablet Stopped by: Howard Pouch, DO       TAKE these medications    ALPRAZolam 0.5 MG tablet Commonly known as: XANAX Take 1 tablet (0.5 mg total) by mouth 2 (two) times daily.   Armour Thyroid 60 MG tablet Generic drug: thyroid Take one tablet daily   diclofenac sodium 1 % Gel Commonly known as: VOLTAREN diclofenac 1 % topical gel  APPLY 2 GRAM TO THE AFFECTED AREA(S) BY TOPICAL ROUTE 2-3 TIMES PER DAY   FOLIC ACID PO Take by mouth.   HYDROcodone-acetaminophen 5-325 MG tablet Commonly known as: NORCO/VICODIN Take 1 tablet by mouth every 6 (six) hours as needed.   ibuprofen 600 MG tablet Commonly known as: ADVIL Take 600 mg by mouth every 6 (six) hours as needed.   ICAPS AREDS 2 PO Take by mouth.   Magnesium 100 MG Caps Take 800 mg by mouth.   methocarbamol 500 MG tablet Commonly known as: ROBAXIN Take 500 mg by mouth 3 (three) times daily as needed.   NON FORMULARY COMPOUNDED MEDICATION  KETAMINE HCL/BACLOFEN/CYCLOBENZAPRINE HCL/DICLOFENAC SOD/GABAPENTIN/LIDOCAINE HCL (TOPI)  Apply a small amount to area up to 4 times a day for pain   nystatin 100000 UNIT/ML suspension Commonly known as: MYCOSTATIN SMARTSIG:1 Teaspoon By Mouth 3-4 Times Daily   Olopatadine HCl 0.6 % Soln Place 2 sprays into both nostrils 2 (two) times daily.   Quercetin 250 MG Tabs Take 1 tablet by mouth daily.   Vitamin D 125 MCG (5000 UT) Caps Take 2 capsules by mouth daily.        All past medical history, surgical history, allergies, family history, immunizations andmedications were updated in the EMR today and reviewed under the history and medication portions of their EMR.     ROS: Negative, with the exception of above mentioned in HPI   Objective:  BP 140/84    Pulse 72   Temp 98 F (36.7 C) (Oral)   Ht 5\' 7"  (1.702 m)   Wt 196 lb (88.9 kg)   SpO2 98%   BMI 30.70 kg/m  Body mass index is 30.7 kg/m. Gen: Afebrile. No acute distress.  Toxic, very pleasant, Caucasian female. HENT: AT. [place].  No cough or hoarseness. Eyes:Pupils Equal Round Reactive to light, Extraocular movements intact,  Conjunctiva without redness, discharge or icterus. Neck/lymp/endocrine: Supple, no lymphadenopathy, no thyromegaly CV: RRR no murmur, no edema, +2/4 P posterior tibialis pulses Chest: CTAB, no wheeze or crackles Skin: No rashes, purpura or petechiae.  Neuro:  Normal gait. PERLA. EOMi. Alert. Oriented x3 Psych: Normal affect, dress and demeanor. Normal speech. Normal thought content and judgment.  No results found. No results found. No results found for this or any previous visit (from the past 24 hour(s)).  Assessment/Plan: Alexsandria Kivett is a 75 y.o. female present for OV for  Generalized anxiety disorder/chronic benzodiazepine use/benzo agreement exists -Stable -Continue Xanax 0.5 mg BID as needed -90-day prescription provided.  Patient will follow-up before leaving for [place] for the winter and we will refill her prescription again at that time to pharmacy of her choice in [place]. - Patient has been  counseled on addictive properties of benzodiazepines as well as side effect profile in geriatric population.   - Controlled substance contract up-to-date - [place] controlled substance database has been reviewed 11/27/20 - Patient is aware that she  will need to be seen at least every 5.5 months face-to-face with this provider for refills on this medication. (virtual ok) - UDS UTD Follow-up before she leaves for [place]  Hyperlipidemia/statin intolerance Lipid UTD-reviewed results with her today.  Encouraged her to increase her exercise and decrease her saturated fats.  Her LDL did increase this year by 20 points and she is statin intolerant. We briefly  discussed other options such as Zetia to help with her cholesterol and she is very hesitant to start any type of medication.   Hypothyroidism: Labs UTD 07/2020 reviewed from her labs completed in [place].  TSH was stable. Continue Armour Thyroid 60 mg.    Reviewed expectations re: course of current medical issues. Discussed self-management of symptoms. Outlined signs and symptoms indicating need for more acute intervention. Patient verbalized understanding and all questions were answered. Patient received an After-Visit Summary.    No orders of the defined types were placed in this encounter.  Meds ordered this encounter  Medications   ARMOUR THYROID 60 MG tablet    Sig: Take one tablet daily    Dispense:  90 tablet    Refill:  3   ALPRAZolam (XANAX) 0.5 MG tablet    Sig: Take 1 tablet (0.5 mg total) by mouth 2 (two) times daily.    Dispense:  180 tablet    Refill:  0       Note is dictated utilizing voice recognition software. Although note has been proof read prior to signing, occasional typographical errors still can be missed. If any questions arise, please do not hesitate to call for verification.   electronically signed by:  Howard Pouch, DO  [place]

## 2020-12-16 ENCOUNTER — Telehealth: Payer: Self-pay | Admitting: Family Medicine

## 2020-12-16 NOTE — Telephone Encounter (Signed)
I spoke to patient and she may call back and schedule Medicare Annual Wellness Visit (AWV).   Please offer to do virtually or by telephone.   Last AWV:10/04/2018  Please schedule at anytime with Nurse Health Advisor.

## 2020-12-30 ENCOUNTER — Encounter: Payer: Self-pay | Admitting: Family Medicine

## 2020-12-30 ENCOUNTER — Telehealth (INDEPENDENT_AMBULATORY_CARE_PROVIDER_SITE_OTHER): Payer: Medicare Other | Admitting: Family Medicine

## 2020-12-30 DIAGNOSIS — J301 Allergic rhinitis due to pollen: Secondary | ICD-10-CM | POA: Diagnosis not present

## 2020-12-30 MED ORDER — OLOPATADINE HCL 0.6 % NA SOLN: 2.0000 | Freq: Two times a day (BID) | NASAL | 5 refills | Status: DC

## 2020-12-30 NOTE — Progress Notes (Addendum)
VIRTUAL VISIT VIA VIDEO  I connected with Carol Norton on 12/30/2020 at  3:00 PM EDT by a video enabled telemedicine application and verified that I am speaking with the correct person using two identifiers. Location patient: Home Location provider: [facility], Office Persons participating in the virtual visit: Patient, Dr. Raoul Pitch and Cyndra Numbers, CMA  I discussed the limitations of evaluation and management by telemedicine and the availability of in person appointments. The patient expressed understanding and agreed to proceed.     Carol Norton , 07-13-1945, 75 y.o.,, female MRN: [id number] Patient Care Team    Relationship Specialty Notifications Start End  [name], DO PCP - General Family Medicine  05/26/16   [name], MD Consulting Physician Orthopedic Surgery  03/11/16   [name], NP Nurse Practitioner Nurse Practitioner  05/26/16   [name], MD Consulting Physician Internal Medicine  05/26/16    Comment: endocrine, thyroid  [name], [name], MD Referring Physician Pulmonary Disease  05/26/16   [name], MD Consulting Physician Neurology  05/26/16   [name], MD Consulting Physician Cardiology  05/26/16   [name], MD Consulting Physician Ophthalmology  05/26/16    Comment: Dr Kathlen Mody at [facility] for glaucoma and cataracts every 3 mos  [name], MD Attending Physician Family Medicine  05/26/16    Comment: [facility]    Chief Complaint  Patient presents with   Allergies    Med refill     Subjective: Pt presents for an OV with complaints of seasonal allergies and rhinitis. She is unable to tolerate antihistamines and steroid preparations. She is able to tolerate olopatadine nasal soln.   Depression screen [facility] 2/9 11/27/2020 02/13/2019 10/04/2018 10/12/2017 09/23/2016  Decreased Interest 0 1 0 0 0  Down, Depressed, Hopeless 0 1 1 0 0  PHQ - 2 Score 0 2 1 0 0  Altered sleeping - 1 0 - -  Tired, decreased energy - 1 0 - -  Change  in appetite - 1 0 - -  Feeling bad or failure about yourself  - 0 0 - -  Trouble concentrating - 1 0 - -  Moving slowly or fidgety/restless - 0 0 - -  Suicidal thoughts - 0 0 - -  PHQ-9 Score - 6 1 - -  Difficult doing work/chores - Somewhat difficult Not difficult at all - -    Allergies  Allergen Reactions   Synthroid [Levothyroxine Sodium] Shortness Of Breath   Levothyroxine Other (See Comments)   Oxycodone-Acetaminophen Itching   Accolate [Zafirlukast]    Avelox [Moxifloxacin Hcl In Nacl]    Citalopram    Crestor [Rosuvastatin]    Dexilant [Dexlansoprazole]    Escitalopram Oxalate    Nsaids     Hair loss   Singulair [Montelukast Sodium]     Aggression,anger   Statins     Myalgias, joint pain.   Wellbutrin [Bupropion]    Dymista [Azelastine-Fluticasone]    Simvastatin Palpitations   Social History   Social History Narrative   Married to [place]. 2 children.   Dinks caffeine.   Patient is right handed.    Exercises routinely.   Wears seatbelt, smoke detector at home.   Feels safe in relationship.   Past Medical History:  Diagnosis Date   Allergic rhinitis    Allergy    Anxiety    Bilateral carpal tunnel syndrome    Patient scheduled for surgery with neurology   Cataract  Chronic constipation    Chronic headache    Chronic sinusitis    CN (constipation) 12/17/2014   COVID-19 virus infection 05/2019   GERD (gastroesophageal reflux disease)    Glaucoma    Hyperlipemia    Hypothyroid    Leg length discrepancy    Ocular rosacea    Plantar fasciitis    Sebaceous cyst    Sleep apnea    on CPAP   Urethral stenosis    Vitamin D deficiency    Past Surgical History:  Procedure Laterality Date   ABDOMINAL HYSTERECTOMY  1983   BREAST SURGERY     sebaceous cyst   DENTAL SURGERY  11/10/2020   EYE SURGERY     surgery with laser   FACIAL COSMETIC SURGERY     1995   GANGLION CYST EXCISION     MENISCUS REPAIR Left    RHINOPLASTY     SEPTOPLASTY      TUBAL LIGATION     Family History  Problem Relation Age of Onset   Emphysema Father    Alcohol abuse Father    Allergies Mother    Heart disease Mother    Ovarian cancer Mother    Hyperlipidemia Mother    Hypothyroidism Mother    Arthritis Mother    Hypothyroidism Sister    Lung cancer Paternal Aunt    Diabetes Maternal Grandmother    Heart disease Maternal Grandmother    Stroke Paternal Grandfather    Allergies as of 12/30/2020       Reactions   Synthroid [levothyroxine Sodium] Shortness Of Breath   Levothyroxine Other (See Comments)   Oxycodone-acetaminophen Itching   Accolate [zafirlukast]    Avelox [moxifloxacin Hcl In Nacl]    Citalopram    Crestor [rosuvastatin]    Dexilant [dexlansoprazole]    Escitalopram Oxalate    Nsaids    Hair loss   Singulair [montelukast Sodium]    Aggression,anger   Statins    Myalgias, joint pain.   Wellbutrin [bupropion]    Dymista [azelastine-fluticasone]    Simvastatin Palpitations        Medication List        Accurate as of December 30, 2020  2:59 PM. If you have any questions, ask your nurse or doctor.          STOP taking these medications    nystatin 100000 UNIT/ML suspension Commonly known as: MYCOSTATIN Stopped by: Howard Pouch, DO       TAKE these medications    ALPRAZolam 0.5 MG tablet Commonly known as: XANAX Take 1 tablet (0.5 mg total) by mouth 2 (two) times daily.   Armour Thyroid 60 MG tablet Generic drug: thyroid Take one tablet daily   diclofenac sodium 1 % Gel Commonly known as: VOLTAREN diclofenac 1 % topical gel  APPLY 2 GRAM TO THE AFFECTED AREA(S) BY TOPICAL ROUTE 2-3 TIMES PER DAY   FOLIC ACID PO Take by mouth.   HYDROcodone-acetaminophen 5-325 MG tablet Commonly known as: NORCO/VICODIN Take 1 tablet by mouth every 6 (six) hours as needed.   ibuprofen 600 MG tablet Commonly known as: ADVIL Take 600 mg by mouth every 6 (six) hours as needed.   ICAPS AREDS 2 PO Take by mouth.    Magnesium 100 MG Caps Take 800 mg by mouth.   methocarbamol 500 MG tablet Commonly known as: ROBAXIN Take 500 mg by mouth 3 (three) times daily as needed.   NON FORMULARY COMPOUNDED MEDICATION  KETAMINE HCL/BACLOFEN/CYCLOBENZAPRINE HCL/DICLOFENAC SOD/GABAPENTIN/LIDOCAINE HCL (TOPI)  Apply a small amount to area up to 4 times a day for pain   Olopatadine HCl 0.6 % Soln Place 2 sprays into both nostrils 2 (two) times daily.   Quercetin 250 MG Tabs Take 1 tablet by mouth daily.   Vitamin D 125 MCG (5000 UT) Caps Take 2 capsules by mouth daily.        All past medical history, surgical history, allergies, family history, immunizations andmedications were updated in the EMR today and reviewed under the history and medication portions of their EMR.     ROS: Negative, with the exception of above mentioned in HPI   Objective:  There were no vitals taken for this visit. There is no height or weight on file to calculate BMI. Gen: Afebrile. No acute distress. Nontoxic in appearance, well developed, well nourished.  HENT: AT. [place].  Eyes:Pupils Equal Round Reactive to light, Extraocular movements intact,  Conjunctiva without redness, discharge or icterus. Neuro: Normal gait. PERLA. EOMi. Alert. Oriented x3  Psych: Normal affect, dress and demeanor. Normal speech. Normal thought content and judgment.  No results found. No results found. No results found for this or any previous visit (from the past 24 hour(s)).  Assessment/Plan: Carol Norton is a 75 y.o. female present for OV for  Seasonal allergic rhinitis due to pollen Stable. Continue olopatadine 0.6% nasal soln.  Unable to use steroid preparations.  F/u PRN  Reviewed expectations re: course of current medical issues. Discussed self-management of symptoms. Outlined signs and symptoms indicating need for more acute intervention. Patient verbalized understanding and all questions were answered. Patient received an After-Visit  Summary.    No orders of the defined types were placed in this encounter.  Meds ordered this encounter  Medications   Olopatadine HCl 0.6 % SOLN    Sig: Place 2 sprays into both nostrils 2 (two) times daily.    Dispense:  30.5 g    Refill:  5    Referral Orders  No referral(s) requested today     Note is dictated utilizing voice recognition software. Although note has been proof read prior to signing, occasional typographical errors still can be missed. If any questions arise, please do not hesitate to call for verification.   electronically signed by:  Howard Pouch, DO  [place]

## 2021-01-06 HISTORY — PX: ROTATOR CUFF REPAIR: SHX139

## 2021-01-14 ENCOUNTER — Telehealth: Payer: Self-pay

## 2021-01-14 MED ORDER — VALACYCLOVIR HCL 1 G PO TABS: ORAL_TABLET | ORAL | 5 refills | Status: DC

## 2021-01-14 NOTE — Telephone Encounter (Signed)
Patient refill request  valACYclovir (VALTREX) 1000 MG tablet  [facility] [place]

## 2021-01-14 NOTE — Telephone Encounter (Signed)
Sent Valtrex to [facility]

## 2021-01-14 NOTE — Telephone Encounter (Signed)
Not on current med list since 2020. Please advise

## 2021-01-15 NOTE — Telephone Encounter (Signed)
noted 

## 2021-01-16 ENCOUNTER — Ambulatory Visit: Payer: Medicare Other | Admitting: Internal Medicine

## 2021-02-09 ENCOUNTER — Telehealth: Payer: Self-pay

## 2021-02-09 NOTE — Telephone Encounter (Signed)
A user error has taken place: encounter opened in error, closed for administrative reasons.

## 2021-02-23 ENCOUNTER — Ambulatory Visit: Payer: Medicare Other | Admitting: Family Medicine

## 2021-02-23 ENCOUNTER — Other Ambulatory Visit: Payer: Self-pay

## 2021-02-23 ENCOUNTER — Encounter: Payer: Self-pay | Admitting: Family Medicine

## 2021-02-23 VITALS — BP 120/69 | HR 79 | Temp 98.1°F | Wt 203.0 lb

## 2021-02-23 DIAGNOSIS — K5900 Constipation, unspecified: Secondary | ICD-10-CM | POA: Diagnosis not present

## 2021-02-23 DIAGNOSIS — R1030 Lower abdominal pain, unspecified: Secondary | ICD-10-CM | POA: Diagnosis not present

## 2021-02-23 LAB — POCT URINALYSIS DIPSTICK
Bilirubin, UA: NEGATIVE
Blood, UA: NEGATIVE
Glucose, UA: NEGATIVE
Ketones, UA: NEGATIVE
Leukocytes, UA: NEGATIVE
Nitrite, UA: NEGATIVE
Protein, UA: NEGATIVE
Spec Grav, UA: 1.01 (ref 1.010–1.025)
Urobilinogen, UA: 0.2 E.U./dL
pH, UA: 6 (ref 5.0–8.0)

## 2021-02-23 MED ORDER — LACTULOSE 10 G PO PACK: 10.0000 g | PACK | Freq: Two times a day (BID) | ORAL | 5 refills | Status: DC

## 2021-02-23 NOTE — Patient Instructions (Addendum)
  Great to see you today.    Senokot extra strength 2 tabs before bed tonight.  Lactulose 10-20  Constipation, Adult Constipation is when a person has fewer than three bowel movements in a week, has difficulty having a bowel movement, or has stools (feces) that are dry, hard, or larger than normal. Constipation may be caused by an underlying condition. It may become worse with age if a person takes certain medicines and does not take in enough fluids. Follow these instructions at home: Eating and drinking  Eat foods that have a lot of fiber, such as beans, whole grains, and fresh fruits and vegetables. Limit foods that are low in fiber and high in fat and processed sugars, such as fried or sweet foods. These include french fries, hamburgers, cookies, candies, and soda. Drink enough fluid to keep your urine pale yellow. General instructions Exercise regularly or as told by your health care provider. Try to do 150 minutes of moderate exercise each week. Use the bathroom when you have the urge to go. Do not hold it in. Take over-the-counter and prescription medicines only as told by your health care provider. This includes any fiber supplements. During bowel movements: Practice deep breathing while relaxing the lower abdomen. Practice pelvic floor relaxation. Watch your condition for any changes. Let your health care provider know about them. Keep all follow-up visits as told by your health care provider. This is important. Contact a health care provider if: You have pain that gets worse. You have a fever. You do not have a bowel movement after 4 days. You vomit. You are not hungry or you lose weight. You are bleeding from the opening between the buttocks (anus). You have thin, pencil-like stools. Get help right away if: You have a fever and your symptoms suddenly get worse. You leak stool or have blood in your stool. Your abdomen is bloated. You have severe pain in your abdomen. You  feel dizzy or you faint. Summary Constipation is when a person has fewer than three bowel movements in a week, has difficulty having a bowel movement, or has stools (feces) that are dry, hard, or larger than normal. Eat foods that have a lot of fiber, such as beans, whole grains, and fresh fruits and vegetables. Drink enough fluid to keep your urine pale yellow. Take over-the-counter and prescription medicines only as told by your health care provider. This includes any fiber supplements. This information is not intended to replace advice given to you by your health care provider. Make sure you discuss any questions you have with your health care provider. Document Revised: 03/28/2019 Document Reviewed: 03/28/2019 Elsevier Patient Education  [place].

## 2021-02-23 NOTE — Progress Notes (Signed)
Ct

## 2021-02-23 NOTE — Progress Notes (Signed)
This visit occurred during the SARS-CoV-2 public health emergency.  Safety protocols were in place, including screening questions prior to the visit, additional usage of staff PPE, and extensive cleaning of exam room while observing appropriate contact time as indicated for disinfecting solutions.    Carol Norton , [birth year], 75 y.o., female MRN: [id number] Patient Care Team    Relationship Specialty Notifications Start End  [name], DO PCP - General Family Medicine  05/26/16   [name], MD Consulting Physician Orthopedic Surgery  03/11/16   [name], NP Nurse Practitioner Nurse Practitioner  05/26/16   [name], MD Consulting Physician Internal Medicine  05/26/16    Comment: endocrine, thyroid  [name], [name], MD Referring Physician Pulmonary Disease  05/26/16   [name], MD Consulting Physician Neurology  05/26/16   [name], MD Consulting Physician Cardiology  05/26/16   [name], MD Consulting Physician Ophthalmology  05/26/16    Comment: Dr Kathlen [facility]ody at [facility] for glaucoma and cataracts every 3 mos  [name], MD Attending Physician Family Medicine  05/26/16    Comment: [facility]    Chief Complaint  Patient presents with   Abdominal Pain    Pt c/o abd pain, gas, bloating, constipation x 4 days;      Subjective: Pt presents for an OV with complaints of constipation and lower abdominal discomfort, increased bloating and gassiness.  She denies fever, chills, vomiting or melena.  She reports her symptoms started few days ago after eating a Sub from [place] [name].  Her symptoms improved for a day or 2 and then she had a [place] steak and cheese from [employer] and her symptoms again resurfaced.  She reports she has chronic constipation and needs to take magnesium and an enema in order to produce a bowel movement.  She states this has been a chronic occurrence throughout her life secondary to having a "kinked "bowel.  Her last bowel  movement was this morning in which she had a small bowel movement and was able to pass gas.  She has been taking Gas-X and Zofran for her symptoms.  She started taking Pepto-Bismol yesterday to make her stomach feel better.  She is on chronic muscle relaxants. She had colonoscopy in [place]-many years ago and has declined further colon cancer screenings by colonoscopy or Cologuard.  Depression screen [facility] 2/9 02/23/2021 11/27/2020 02/13/2019 10/04/2018 10/12/2017  Decreased Interest 0 0 1 0 0  Down, Depressed, Hopeless 0 0 1 1 0  PHQ - 2 Score 0 0 2 1 0  Altered sleeping - - 1 0 -  Tired, decreased energy - - 1 0 -  Change in appetite - - 1 0 -  Feeling bad or failure about yourself  - - 0 0 -  Trouble concentrating - - 1 0 -  Moving slowly or fidgety/restless - - 0 0 -  Suicidal thoughts - - 0 0 -  PHQ-9 Score - - 6 1 -  Difficult doing work/chores - - Somewhat difficult Not difficult at all -    Allergies  Allergen Reactions   Synthroid [Levothyroxine Sodium] Shortness Of Breath   Levothyroxine Other (See Comments)   Oxycodone-Acetaminophen Itching   Accolate [Zafirlukast]    Avelox [Moxifloxacin Hcl In Nacl]    Citalopram    Crestor [Rosuvastatin]    Dexilant [Dexlansoprazole]    Escitalopram Oxalate    Nsaids     Hair loss   Singulair [Montelukast Sodium]  Aggression,anger   Statins     Myalgias, joint pain.   Wellbutrin [Bupropion]    Dymista [Azelastine-Fluticasone]    Simvastatin Palpitations   Social History   Social History Narrative   Married to [name]. 2 children.   Dinks caffeine.   Patient is right handed.    Exercises routinely.   Wears seatbelt, smoke detector at home.   Feels safe in relationship.   Past Medical History:  Diagnosis Date   Allergic rhinitis    Allergy    Anxiety    Bilateral carpal tunnel syndrome    Patient scheduled for surgery with neurology   Cataract    Chronic constipation    Chronic headache    Chronic sinusitis     CN (constipation) 12/17/2014   COVID-19 virus infection 05/2019   GERD (gastroesophageal reflux disease)    Glaucoma    Hyperlipemia    Hypothyroid    Leg length discrepancy    Ocular rosacea    Plantar fasciitis    Sebaceous cyst    Sleep apnea    on CPAP   Urethral stenosis    Vitamin D deficiency    Past Surgical History:  Procedure Laterality Date   ABDOMINAL HYSTERECTOMY  1983   BREAST SURGERY     sebaceous cyst   DENTAL SURGERY  11/10/2020   EYE SURGERY     surgery with laser   FACIAL COSMETIC SURGERY     1995   GANGLION CYST EXCISION     MENISCUS REPAIR Left    RHINOPLASTY     ROTATOR CUFF REPAIR Right 01/06/2021   SEPTOPLASTY     TUBAL LIGATION     Family History  Problem Relation Age of Onset   Emphysema Father    Alcohol abuse Father    Allergies Mother    Heart disease Mother    Ovarian cancer Mother    Hyperlipidemia Mother    Hypothyroidism Mother    Arthritis Mother    Hypothyroidism Sister    Lung cancer Paternal Aunt    Diabetes Maternal Grandmother    Heart disease Maternal Grandmother    Stroke Paternal Grandfather    Allergies as of 02/23/2021       Reactions   Synthroid [levothyroxine Sodium] Shortness Of Breath   Levothyroxine Other (See Comments)   Oxycodone-acetaminophen Itching   Accolate [zafirlukast]    Avelox [moxifloxacin Hcl In Nacl]    Citalopram    Crestor [rosuvastatin]    Dexilant [dexlansoprazole]    Escitalopram Oxalate    Nsaids    Hair loss   Singulair [montelukast Sodium]    Aggression,anger   Statins    Myalgias, joint pain.   Wellbutrin [bupropion]    Dymista [azelastine-fluticasone]    Simvastatin Palpitations        Medication List        Accurate as of February 23, 2021  5:06 PM. If you have any questions, ask your nurse or doctor.          STOP taking these medications    HYDROcodone-acetaminophen 5-325 MG tablet Commonly known as: NORCO/VICODIN Stopped by: Howard Pouch, DO   ibuprofen 600  MG tablet Commonly known as: ADVIL Stopped by: Howard Pouch, DO       TAKE these medications    ALPRAZolam 0.5 MG tablet Commonly known as: XANAX Take 1 tablet (0.5 mg total) by mouth 2 (two) times daily.   Armour Thyroid 60 MG tablet Generic drug: thyroid Take one tablet daily  diclofenac sodium 1 % Gel Commonly known as: VOLTAREN diclofenac 1 % topical gel  APPLY 2 GRAM TO THE AFFECTED AREA(S) BY TOPICAL ROUTE 2-3 TIMES PER DAY   FOLIC ACID PO Take by mouth.   ICAPS AREDS 2 PO Take by mouth.   lactulose 10 g packet Commonly known as: CEPHULAC Take 1-2 packets (10-20 g total) by mouth 2 (two) times daily. Started by: Howard Pouch, DO   Magnesium 100 MG Caps Take 800 mg by mouth.   methocarbamol 500 MG tablet Commonly known as: ROBAXIN Take 500 mg by mouth 3 (three) times daily as needed.   NON FORMULARY COMPOUNDED MEDICATION  KETAMINE HCL/BACLOFEN/CYCLOBENZAPRINE HCL/DICLOFENAC SOD/GABAPENTIN/LIDOCAINE HCL (TOPI)  Apply a small amount to area up to 4 times a day for pain   Olopatadine HCl 0.6 % Soln Place 2 sprays into both nostrils 2 (two) times daily.   Quercetin 250 MG Tabs Take 1 tablet by mouth daily.   valACYclovir 1000 MG tablet Commonly known as: VALTREX 2 tabs at onset of cold sore and repeat dose once 12 hours later.   Vitamin D 125 MCG (5000 UT) Caps Take 2 capsules by mouth daily.        All past medical history, surgical history, allergies, family history, immunizations andmedications were updated in the EMR today and reviewed under the history and medication portions of their EMR.     ROS: Negative, with the exception of above mentioned in HPI   Objective:  BP 120/69   Pulse 79   Temp 98.1 F (36.7 C) (Oral)   Wt 203 lb (92.1 kg)   SpO2 97%   BMI 31.79 kg/m  Body mass index is 31.79 kg/m. Gen: Afebrile. No acute distress. Nontoxic in appearance, well developed, well nourished. Pleasant obese female.  HENT: AT. [place].   Eyes:Pupils Equal Round Reactive to light, Extraocular movements intact,  Conjunctiva without redness, discharge or icterus. Abd: Soft.  Mildly distended.  NT. BS hypoactive.  No masses palpated.  Moderate stool burden palpated. No rebound or guarding. Skin: no rashes, purpura or petechiae.  Neuro: Normal gait. PERLA. EOMi. Alert. Oriented x3  Psych: Normal affect, dress and demeanor. Normal speech. Normal thought content and judgment.  No results found. No results found. Results for orders placed or performed in visit on 02/23/21 (from the past 24 hour(s))  POCT Urinalysis Dipstick     Status: Normal   Collection Time: 02/23/21  4:09 PM  Result Value Ref Range   Color, UA light yellow    Clarity, UA clear    Glucose, UA Negative Negative   Bilirubin, UA neg    Ketones, UA neg    Spec Grav, UA 1.010 1.010 - 1.025   Blood, UA neg    pH, UA 6.0 5.0 - 8.0   Protein, UA Negative Negative   Urobilinogen, UA 0.2 0.2 or 1.0 E.U./dL   Nitrite, UA neg    Leukocytes, UA Negative Negative   Appearance     Odor      Assessment/Plan: Estell Puccini is a 75 y.o. female present for OV for  Constipation, unspecified constipation type Vital signs are stable on exam today.  Patient tolerated exam without much discomfort.  Do not suspect acute abdomen or obstruction at this time. Suspect at least moderate constipation and stool burden causing her GI symptoms. Encouraged her to start Prilosec, she has Prilosec OTC at home she uses on occasions.  Avoid any further use of Pepto-Bismol. Encouraged her to start Senokot extra  strength 2 tabs this evening. Encouraged her to use caution when using magnesium to produce bowel movements. She declined trial of Amitiza or Linzess. She is agreeable to try lactulose. Patient was encouraged to report to the emergency room immediately if stomach pain worsens or she is unable to pass gas or have a bowel movement, that would indicate an obstruction and that is an  emergent situation.  Patient reports understanding.  Lower abdominal pain Urinalysis is normal.  Suspect urinary complaints are secondary to constipation.  Will send for culture to be certain. - POCT Urinalysis Dipstick - Urinalysis w microscopic + reflex cultur  Reviewed expectations re: course of current medical issues. Discussed self-management of symptoms. Outlined signs and symptoms indicating need for more acute intervention. Patient verbalized understanding and all questions were answered. Patient received an After-Visit Summary.    Orders Placed This Encounter  Procedures   Urinalysis w microscopic + reflex cultur   POCT Urinalysis Dipstick   Meds ordered this encounter  Medications   lactulose (CEPHULAC) 10 g packet    Sig: Take 1-2 packets (10-20 g total) by mouth 2 (two) times daily.    Dispense:  30 each    Refill:  5    Referral Orders  No referral(s) requested today     Note is dictated utilizing voice recognition software. Although note has been proof read prior to signing, occasional typographical errors still can be missed. If any questions arise, please do not hesitate to call for verification.   electronically signed by:  Howard Pouch, DO  [place]

## 2021-02-25 LAB — URINALYSIS W MICROSCOPIC + REFLEX CULTURE
Bacteria, UA: NONE SEEN /HPF
Bilirubin Urine: NEGATIVE
Glucose, UA: NEGATIVE
Hgb urine dipstick: NEGATIVE
Hyaline Cast: NONE SEEN /LPF
Ketones, ur: NEGATIVE
Nitrites, Initial: NEGATIVE
Protein, ur: NEGATIVE
Specific Gravity, Urine: 1.004 (ref 1.001–1.035)
Squamous Epithelial / HPF: NONE SEEN /HPF (ref <=5)
WBC, UA: NONE SEEN /HPF (ref 0–5)
pH: 6.5 (ref 5.0–8.0)

## 2021-02-25 LAB — URINE CULTURE
MICRO NUMBER:: 12456748
SPECIMEN QUALITY:: ADEQUATE

## 2021-02-25 LAB — CULTURE INDICATED

## 2021-03-09 ENCOUNTER — Other Ambulatory Visit: Payer: Self-pay

## 2021-03-09 ENCOUNTER — Ambulatory Visit: Payer: Medicare Other | Admitting: Family Medicine

## 2021-03-09 ENCOUNTER — Encounter: Payer: Self-pay | Admitting: Family Medicine

## 2021-03-09 VITALS — BP 106/68 | HR 77 | Temp 97.0°F | Ht 67.0 in | Wt 202.0 lb

## 2021-03-09 DIAGNOSIS — F411 Generalized anxiety disorder: Secondary | ICD-10-CM

## 2021-03-09 DIAGNOSIS — F1310 Sedative, hypnotic or anxiolytic abuse, uncomplicated: Secondary | ICD-10-CM

## 2021-03-09 DIAGNOSIS — E782 Mixed hyperlipidemia: Secondary | ICD-10-CM

## 2021-03-09 DIAGNOSIS — Z79899 Other long term (current) drug therapy: Secondary | ICD-10-CM

## 2021-03-09 DIAGNOSIS — E039 Hypothyroidism, unspecified: Secondary | ICD-10-CM | POA: Diagnosis not present

## 2021-03-09 DIAGNOSIS — Z789 Other specified health status: Secondary | ICD-10-CM

## 2021-03-09 MED ORDER — ARMOUR THYROID 60 MG PO TABS: ORAL_TABLET | ORAL | 3 refills | Status: DC

## 2021-03-09 MED ORDER — ALPRAZOLAM 0.5 MG PO TABS: 0.5000 mg | ORAL_TABLET | Freq: Two times a day (BID) | ORAL | 1 refills | Status: DC

## 2021-03-09 NOTE — Patient Instructions (Addendum)
  Great to see you today.  I have refilled the medication(s) we provide.   If labs were collected, we will inform you of lab results once received either by echart message or telephone call.   - echart message- for normal results that have been seen by the patient already.   - telephone call: abnormal results or if patient has not viewed results in their echart.  See you when you get back from [place].

## 2021-03-09 NOTE — Progress Notes (Signed)
Carol Norton , 05/02/1946, 75 y.o., female MRN: [id number] Patient Care Team    Relationship Specialty Notifications Start End  [name], DO PCP - General Family Medicine  05/26/16   [name], MD Consulting Physician Orthopedic Surgery  03/11/16   [name], NP Nurse Practitioner Nurse Practitioner  05/26/16   [name], MD Consulting Physician Internal Medicine  05/26/16    Comment: endocrine, thyroid  [name], [name], MD Referring Physician Pulmonary Disease  05/26/16   [name], MD Consulting Physician Neurology  05/26/16   [name], MD Consulting Physician Cardiology  05/26/16   [name], MD Consulting Physician Ophthalmology  05/26/16    Comment: Dr Kathlen Mody at [facility] for glaucoma and cataracts every 3 mos  [name], MD Attending Physician Family Medicine  05/26/16    Comment: [facility]    Chief Complaint  Patient presents with   Anxiety    [facility]     Subjective: Carol Norton is a 75 y.o. female present for follow-up on chronic conditions Anxiety:  Patient reports she is doing very well on her regimen and reports compliance with Xanax 0.5 mg twice daily. She has been unable to tolerate citalopram, Lexapro or Wellbutrin in the past.  She has many reactions to different types of medications.   She does have a controlled substance contract. Patient has been counseled on the addictive properties of benzodiazepines. She has been tried on other medications with side effects.   Hypothyroid: Patient reports compliance with Armour Thyroid 60 mg daily.  She denies any side effects today or worsening palpitations.  She has had labs drawn through the physician she sees when she is in [place].  TSH was normal March 2022.  Hyperlipidemia: Patient has had elevated cholesterol.  She is intolerant to statins.  Her LDL had increased upon review of the [place] labs from March 2022.  LDL now 153.  Depression screen [facility] 2/9 03/09/2021 02/23/2021 11/27/2020  02/13/2019 10/04/2018  Decreased Interest 0 0 0 1 0  Down, Depressed, Hopeless 0 0 0 1 1  PHQ - 2 Score 0 0 0 2 1  Altered sleeping 0 - - 1 0  Tired, decreased energy 0 - - 1 0  Change in appetite 0 - - 1 0  Feeling bad or failure about yourself  0 - - 0 0  Trouble concentrating 0 - - 1 0  Moving slowly or fidgety/restless 0 - - 0 0  Suicidal thoughts 0 - - 0 0  PHQ-9 Score 0 - - 6 1  Difficult doing work/chores - - - Somewhat difficult Not difficult at all   GAD 7 : Generalized Anxiety Score 03/09/2021 02/13/2019 10/04/2018 01/11/2017  Nervous, Anxious, on Edge 1 2 0 1  Control/stop worrying 1 2 0 2  Worry too much - different things 1 2 1 1   Trouble relaxing 1 1 0 1  Restless 0 0 0 1  Easily annoyed or irritable 1 0 0 0  Afraid - awful might happen 1 2 1  0  Total GAD 7 Score 6 9 2 6   Anxiety Difficulty - Somewhat difficult Not difficult at all Somewhat difficult   Allergies  Allergen Reactions   Synthroid [Levothyroxine Sodium] Shortness Of Breath   Levothyroxine Other (See Comments)   Oxycodone-Acetaminophen Itching   Accolate [Zafirlukast]    Avelox [Moxifloxacin Hcl In Nacl]    Citalopram    Crestor [Rosuvastatin]    Dexilant [Dexlansoprazole]    Escitalopram  Oxalate    Nsaids     Hair loss   Singulair [Montelukast Sodium]     Aggression,anger   Statins     Myalgias, joint pain.   Wellbutrin [Bupropion]    Dymista [Azelastine-Fluticasone]    Simvastatin Palpitations   Social History   Tobacco Use   Smoking status: Former    Packs/day: 0.75    Years: 25.00    Pack years: 18.75    Types: Cigarettes    Quit date: 05/25/2007    Years since quitting: 13.8   Smokeless tobacco: Never   Tobacco comments:    quit for 10 years in the total 44yrs.   Substance Use Topics   Alcohol use: No    Comment: rare   Past Medical History:  Diagnosis Date   Allergic rhinitis    Allergy    Anxiety    Bilateral carpal tunnel syndrome    Patient scheduled for surgery with  neurology   Cataract    Chronic constipation    Chronic headache    Chronic sinusitis    CN (constipation) 12/17/2014   COVID-19 virus infection 05/2019   GERD (gastroesophageal reflux disease)    Glaucoma    Hyperlipemia    Hypothyroid    Leg length discrepancy    Ocular rosacea    Plantar fasciitis    Sebaceous cyst    Sleep apnea    on CPAP   Urethral stenosis    Vitamin D deficiency    Past Surgical History:  Procedure Laterality Date   ABDOMINAL HYSTERECTOMY  1983   BREAST SURGERY     sebaceous cyst   DENTAL SURGERY  11/10/2020   EYE SURGERY     surgery with laser   FACIAL COSMETIC SURGERY     1995   GANGLION CYST EXCISION     MENISCUS REPAIR Left    RHINOPLASTY     ROTATOR CUFF REPAIR Right 01/06/2021   SEPTOPLASTY     TUBAL LIGATION     Family History  Problem Relation Age of Onset   Emphysema Father    Alcohol abuse Father    Allergies Mother    Heart disease Mother    Ovarian cancer Mother    Hyperlipidemia Mother    Hypothyroidism Mother    Arthritis Mother    Hypothyroidism Sister    Lung cancer Paternal Aunt    Diabetes Maternal Grandmother    Heart disease Maternal Grandmother    Stroke Paternal Grandfather    Allergies as of 03/09/2021       Reactions   Synthroid [levothyroxine Sodium] Shortness Of Breath   Levothyroxine Other (See Comments)   Oxycodone-acetaminophen Itching   Accolate [zafirlukast]    Avelox [moxifloxacin Hcl In Nacl]    Citalopram    Crestor [rosuvastatin]    Dexilant [dexlansoprazole]    Escitalopram Oxalate    Nsaids    Hair loss   Singulair [montelukast Sodium]    Aggression,anger   Statins    Myalgias, joint pain.   Wellbutrin [bupropion]    Dymista [azelastine-fluticasone]    Simvastatin Palpitations        Medication List        Accurate as of March 09, 2021 11:29 AM. If you have any questions, ask your nurse or doctor.          STOP taking these medications    lactulose 10 g  packet Commonly known as: [place] Stopped by: Howard Pouch, DO       TAKE  these medications    ALPRAZolam 0.5 MG tablet Commonly known as: XANAX Take 1 tablet (0.5 mg total) by mouth 2 (two) times daily.   Armour Thyroid 60 MG tablet Generic drug: thyroid Take one tablet daily   diclofenac sodium 1 % Gel Commonly known as: VOLTAREN diclofenac 1 % topical gel  APPLY 2 GRAM TO THE AFFECTED AREA(S) BY TOPICAL ROUTE 2-3 TIMES PER DAY   FOLIC ACID PO Take by mouth.   ICAPS AREDS 2 PO Take by mouth.   Magnesium 100 MG Caps Take 800 mg by mouth.   methocarbamol 500 MG tablet Commonly known as: ROBAXIN Take 500 mg by mouth 3 (three) times daily as needed.   NON FORMULARY COMPOUNDED MEDICATION  KETAMINE HCL/BACLOFEN/CYCLOBENZAPRINE HCL/DICLOFENAC SOD/GABAPENTIN/LIDOCAINE HCL (TOPI)  Apply a small amount to area up to 4 times a day for pain   Olopatadine HCl 0.6 % Soln Place 2 sprays into both nostrils 2 (two) times daily.   Quercetin 250 MG Tabs Take 1 tablet by mouth daily.   valACYclovir 1000 MG tablet Commonly known as: VALTREX 2 tabs at onset of cold sore and repeat dose once 12 hours later.   Vitamin D 125 MCG (5000 UT) Caps Take 2 capsules by mouth daily.        All past medical history, surgical history, allergies, family history, immunizations andmedications were updated in the EMR today and reviewed under the history and medication portions of their EMR.     ROS: Negative, with the exception of above mentioned in HPI   Objective:  BP 106/68   Pulse 77   Temp (!) 97 F (36.1 C) (Oral)   Ht 5\' 7"  (1.702 m)   Wt 202 lb (91.6 kg)   SpO2 98%   BMI 31.64 kg/m  Body mass index is 31.64 kg/m. Gen: Afebrile. No acute distress.  Nontoxic.  Pleasant obese female HENT: AT. [place].  No cough.  No hoarseness. Eyes:Pupils Equal Round Reactive to light, Extraocular movements intact,  Conjunctiva without redness, discharge or icterus. Neck/lymp/endocrine:  Supple, no lymphadenopathy, no thyromegaly CV: RRR no murmur, no edema Chest: CTAB, no wheeze or crackles Skin: No rashes, purpura or petechiae.  Neuro:  Normal gait. PERLA. EOMi. Alert. Oriented x3 Psych: Normal affect, dress and demeanor. Normal speech. Normal thought content and judgment.   No results found. No results found. No results found for this or any previous visit (from the past 24 hour(s)).  Assessment/Plan: Carol Norton is a 75 y.o. female present for OV for  Generalized anxiety disorder/chronic benzodiazepine use/benzo agreement exists Stable Continue Xanax 0.5 mg BID as needed -90-day prescription provided w/ refill. She  may not go to [place] this year.   Patient will follow-up before leaving for [place] ([place] if they do go) for the winter and we will refill her prescription again at that time to pharmacy of her choice in [place]. - Patient has been  counseled on addictive properties of benzodiazepines as well as side effect profile in geriatric population.   - Controlled substance contract up-to-date - [place] controlled substance database has been reviewed 03/09/21 - Patient is aware that she will need to be seen at least every 5.5 months face-to-face with this provider for refills on this medication. (virtual ok) - UDS UTD  Hyperlipidemia/statin intolerance Lipid UTD. Encouraged her to increase her exercise and decrease her saturated fats.  Her LDL did increase this year by 20 points and she is statin intolerant. We briefly discussed other options such  as Zetia to help with her cholesterol and she is very hesitant to start any type of medication.   Hypothyroidism: Labs UTD 07/2020 reviewed from her labs completed in [place].  TSH was stable. Continue Armour Thyroid 60 mg.    Reviewed expectations re: course of current medical issues. Discussed self-management of symptoms. Outlined signs and symptoms indicating need for more acute intervention. Patient verbalized  understanding and all questions were answered. Patient received an After-Visit Summary.    No orders of the defined types were placed in this encounter.  Meds ordered this encounter  Medications   ARMOUR THYROID 60 MG tablet    Sig: Take one tablet daily    Dispense:  90 tablet    Refill:  3   ALPRAZolam (XANAX) 0.5 MG tablet    Sig: Take 1 tablet (0.5 mg total) by mouth 2 (two) times daily.    Dispense:  180 tablet    Refill:  1        Note is dictated utilizing voice recognition software. Although note has been proof read prior to signing, occasional typographical errors still can be missed. If any questions arise, please do not hesitate to call for verification.   electronically signed by:  Howard Pouch, DO  [place]

## 2021-03-17 ENCOUNTER — Other Ambulatory Visit: Payer: Self-pay

## 2021-03-17 ENCOUNTER — Ambulatory Visit (INDEPENDENT_AMBULATORY_CARE_PROVIDER_SITE_OTHER): Payer: Medicare Other | Admitting: Allergy and Immunology

## 2021-03-17 ENCOUNTER — Encounter: Payer: Self-pay | Admitting: Allergy and Immunology

## 2021-03-17 VITALS — BP 144/68 | HR 91 | Temp 98.3°F | Resp 20 | Ht 66.0 in | Wt 203.8 lb

## 2021-03-17 DIAGNOSIS — M3500 Sicca syndrome, unspecified: Secondary | ICD-10-CM

## 2021-03-17 DIAGNOSIS — J3089 Other allergic rhinitis: Secondary | ICD-10-CM

## 2021-03-17 DIAGNOSIS — G4733 Obstructive sleep apnea (adult) (pediatric): Secondary | ICD-10-CM

## 2021-03-17 DIAGNOSIS — L718 Other rosacea: Secondary | ICD-10-CM

## 2021-03-17 MED ORDER — OLOPATADINE HCL 0.6 % NA SOLN: 2.0000 | Freq: Two times a day (BID) | NASAL | 5 refills | Status: DC

## 2021-03-17 MED ORDER — OLOPATADINE HCL 0.6 % NA SOLN: 2.0000 | Freq: Two times a day (BID) | NASAL | 11 refills | Status: DC

## 2021-03-17 NOTE — Progress Notes (Signed)
[place]   Follow-up Note  Referring Provider: [name], DO Primary Provider: [name], DO Date of Office Visit: 03/17/2021  Subjective:   Carol Norton (DOB: 05/31/1945) is a 75 y.o. female who returns to the [facility] [facility] on 03/17/2021 in re-evaluation of the following:  HPI: Carol Norton returns to this clinic in evaluation of allergic rhinitis, ocular rosacea, dry eye syndrome, and sleep apnea.  Her last visit to this clinic was 08 November 2017.  Overall she has done very well with her sleep apnea while using CPAP machine.  She is very satisfied with the response she has received with this device.  She has developed some very significant eye issues including macular degeneration and glaucoma that has required laser therapy.  She remains away from all nasal steroids secondary to this issue.  She is using vital tears as her only treatment for her eyes at this point.  She did discontinue her doxycycline.  Overall she is satisfied with the response she is receiving with her current therapy regarding her eyes.  Likewise, the skin on her face is doing relatively well.  Currently uses laser therapy on her skin at times.  She does not require any topical therapy.  She has developed dry mouth as well as dry eye syndrome.  She thinks that her dry mouth syndrome may be related to the use of tramadol which she is using for physical therapy of her right shoulder following rotator cuff repair August 2022.  She uses olopatadine nasal spray which works pretty well for her upper airway.  She only utilizes this agent a few times per month.  She has not required an antibiotic to treat a episode of sinusitis and she does not have any anosmia.  She contracted COVID in 2020.  She has not received any additional COVID vaccines nor does she have a desire to do so.  Allergies as of 03/17/2021       Reactions   Synthroid [levothyroxine Sodium]  Shortness Of Breath   Levothyroxine Other (See Comments)   Oxycodone-acetaminophen Itching   Accolate [zafirlukast]    Avelox [moxifloxacin Hcl In Nacl]    Citalopram    Crestor [rosuvastatin]    Dexilant [dexlansoprazole]    Escitalopram Oxalate    Nsaids    Hair loss   Singulair [montelukast Sodium]    Aggression,anger   Statins    Myalgias, joint pain.   Wellbutrin [bupropion]    Dymista [azelastine-fluticasone]    Simvastatin Palpitations        Medication List    ALPRAZolam 0.5 MG tablet Commonly known as: XANAX Take 1 tablet (0.5 mg total) by mouth 2 (two) times daily.   Armour Thyroid 60 MG tablet Generic drug: thyroid Take one tablet daily   diclofenac sodium 1 % Gel Commonly known as: VOLTAREN diclofenac 1 % topical gel  APPLY 2 GRAM TO THE AFFECTED AREA(S) BY TOPICAL ROUTE 2-3 TIMES PER DAY   FOLIC ACID PO Take by mouth.   ICAPS AREDS 2 PO Take by mouth.   PRESERVISION AREDS 2 PO Take by mouth.   Magnesium 100 MG Caps Take 800 mg by mouth.   methocarbamol 500 MG tablet Commonly known as: ROBAXIN Take 500 mg by mouth 3 (three) times daily as needed.   NON FORMULARY COMPOUNDED MEDICATION  KETAMINE HCL/BACLOFEN/CYCLOBENZAPRINE HCL/DICLOFENAC SOD/GABAPENTIN/LIDOCAINE HCL (TOPI)  Apply a small amount to area up to 4 times a day for  pain   Olopatadine HCl 0.6 % Soln Place 2 sprays into both nostrils 2 (two) times daily.   Quercetin 250 MG Tabs Take 1 tablet by mouth daily.   valACYclovir 1000 MG tablet Commonly known as: VALTREX 2 tabs at onset of cold sore and repeat dose once 12 hours later.   Vitamin D 125 MCG (5000 UT) Caps Take 2 capsules by mouth daily.    Past Medical History:  Diagnosis Date   Allergic rhinitis    Allergy    Anxiety    Bilateral carpal tunnel syndrome    Patient scheduled for surgery with neurology   Cataract    Chronic constipation    Chronic headache    Chronic sinusitis    CN (constipation) 12/17/2014    COVID-19 virus infection 05/2019   GERD (gastroesophageal reflux disease)    Glaucoma    Hyperlipemia    Hypothyroid    Leg length discrepancy    Ocular rosacea    Plantar fasciitis    Sebaceous cyst    Sleep apnea    on CPAP   Urethral stenosis    Vitamin D deficiency     Past Surgical History:  Procedure Laterality Date   ABDOMINAL HYSTERECTOMY  1983   BREAST SURGERY     sebaceous cyst   DENTAL SURGERY  11/10/2020   EYE SURGERY     surgery with laser   FACIAL COSMETIC SURGERY     1995   GANGLION CYST EXCISION     MENISCUS REPAIR Left    RHINOPLASTY     ROTATOR CUFF REPAIR Right 01/06/2021   SEPTOPLASTY     TUBAL LIGATION      Review of systems negative except as noted in HPI / PMHx or noted below:  Review of Systems  Constitutional: Negative.   HENT: Negative.    Eyes: Negative.   Respiratory: Negative.    Cardiovascular: Negative.   Gastrointestinal: Negative.   Genitourinary: Negative.   Musculoskeletal: Negative.   Skin: Negative.   Neurological: Negative.   Endo/Heme/Allergies: Negative.   Psychiatric/Behavioral: Negative.      Objective:   Vitals:   03/17/21 1355  BP: (!) 144/68  Pulse: 91  Resp: 20  Temp: 98.3 F (36.8 C)  SpO2: 96%   Height: 5\' 6"  (167.6 cm)  Weight: 203 lb 12.8 oz (92.4 kg)   Physical Exam Constitutional:      Appearance: She is not diaphoretic.  HENT:     Head: Normocephalic.     Right Ear: Tympanic membrane, ear canal and external ear normal.     Left Ear: Tympanic membrane, ear canal and external ear normal.     Nose: Nose normal. No mucosal edema or rhinorrhea.     Mouth/Throat:     Pharynx: Uvula midline. No oropharyngeal exudate.  Eyes:     Conjunctiva/sclera: Conjunctivae normal.  Neck:     Thyroid: No thyromegaly.     Trachea: Trachea normal. No tracheal tenderness or tracheal deviation.  Cardiovascular:     Rate and Rhythm: Normal rate and regular rhythm.     Heart sounds: Normal heart sounds, S1  normal and S2 normal. No murmur heard. Pulmonary:     Effort: No respiratory distress.     Breath sounds: Normal breath sounds. No stridor. No wheezing or rales.  Lymphadenopathy:     Head:     Right side of head: No tonsillar adenopathy.     Left side of head: No tonsillar adenopathy.  Cervical: No cervical adenopathy.  Skin:    Findings: No erythema or rash.     Nails: There is no clubbing.  Neurological:     Mental Status: She is alert.    Diagnostics: none  Assessment and Plan:   1. Perennial allergic rhinitis   2. Ocular rosacea   3. Sicca syndrome ([place])   4. OSA (obstructive sleep apnea)     1.  Continue CPAP  2.  Use Biotene mouthwash, nasal saline  3.  Continue olopatadine 1-2 sprays each nostril 1-2 times per day if needed  4.  Avoid all oral antihistamines secondary to dry mouth/eye syndrome  5.  Obtain fall flu vaccine  6.  Return to clinic in 1 year or earlier if problem  Karrina will continue on the therapy noted above to address her sleep apnea and sicca syndrome and inflammation and irritation of her upper airway and assuming she does well with this plan I will see her back in this clinic in 1 year or earlier if there is a problem.  Allena Katz, MD Allergy / Immunology [place]

## 2021-03-17 NOTE — Patient Instructions (Addendum)
  1.  Continue CPAP  2.  Use Biotene mouthwash, nasal saline  3.  Continue olopatadine 1-2 sprays each nostril 1-2 times per day if needed  4.  Avoid all oral antihistamines secondary to dry mouth/eye syndrome  5.  Obtain fall flu vaccine  6.  Return to clinic in 1 year or earlier if problem

## 2021-03-18 ENCOUNTER — Encounter: Payer: Self-pay | Admitting: Allergy and Immunology

## 2021-05-08 ENCOUNTER — Other Ambulatory Visit: Payer: Self-pay | Admitting: Internal Medicine

## 2021-05-08 ENCOUNTER — Other Ambulatory Visit: Payer: Self-pay | Admitting: Orthopaedic Surgery

## 2021-05-08 DIAGNOSIS — M25511 Pain in right shoulder: Secondary | ICD-10-CM

## 2021-05-11 ENCOUNTER — Ambulatory Visit: Payer: Medicare Other | Admitting: Family Medicine

## 2021-05-11 NOTE — Progress Notes (Addendum)
Anesthesia Review:  PCP: dr Gar Ponto  In [place]- DR Gunnar Fusi at [facility]  Cardiologist : none  Chest x-ray : EKG : Echo : Stress test: Cardiac Cath :  Activity level: can do a flight of stairs without difficulty  Sleep Study/ CPAP : has cpap  Fasting Blood Sugar :      / Checks Blood Sugar -- times a day:   Blood Thinner/ Instructions /Last Dose: ASA / Instructions/ Last Dose :   With anesthesia pt reports with ketamine very slow to wake up after surgery and with  nerve block - pt states she could not take a deep breath for a whole day after surgery.   No covid test ambulatory surgery  PT called on 05/13/21 and had quesitons regarding her topical diclofenac prior to surgery.  Instructed pt to call DR Griffin Basil office and speak with surgery scheduler or LVMM.  Either surgeon or PA will be in contact with her.  PT voiced understanding.

## 2021-05-11 NOTE — Progress Notes (Signed)
Need orders in epic.  Preop on 12/20/  Surgery on 05/20/21.

## 2021-05-11 NOTE — Progress Notes (Addendum)
PHILANA YOUNIS  05/11/2021   Your procedure is scheduled on:  05/20/21   Report to [facility] Main  Entrance   Report to admitting at   1130 AM     Call this number if you have problems the morning of surgery [phone]    Remember: Do not eat food , candy gum or mints :After Midnight. You may have clear liquids from midnight until __ 1100am  Complete ensure preop dirnk by 1100am morning of surgery.    CLEAR LIQUID DIET   Foods Allowed                                                                       Coffee and tea, regular and decaf                              Plain Jell-O any favor except red or purple                                            Fruit ices (not with fruit pulp)                                      Iced Popsicles                                     Carbonated beverages, regular and diet                                    Cranberry, grape and apple juices Sports drinks like Gatorade Lightly seasoned clear broth or consume(fat free) Sugar   _____________________________________________________________________    BRUSH YOUR TEETH MORNING OF SURGERY AND RINSE YOUR MOUTH OUT, NO CHEWING GUM CANDY OR MINTS.     Take these medicines the morning of surgery with A SIP OF WATER:  armour Thyroid   DO NOT TAKE ANY DIABETIC MEDICATIONS DAY OF YOUR SURGERY                               You may not have any metal on your body including hair pins and              piercings  Do not wear jewelry, make-up, lotions, powders or perfumes, deodorant             Do not wear nail polish on your fingernails.  Do not shave  48 hours prior to surgery.              Men may shave face and neck.   Do not bring valuables to the hospital. [place]  FOR VALUABLES.  Contacts, dentures or bridgework may not be worn into surgery.  Leave suitcase in the car. After surgery it may be brought to your room.     Patients  discharged the day of surgery will not be allowed to drive home. IF YOU ARE HAVING SURGERY AND GOING HOME THE SAME DAY, YOU MUST HAVE AN ADULT TO DRIVE YOU HOME AND BE WITH YOU FOR 24 HOURS. YOU MAY GO HOME BY TAXI OR UBER OR ORTHERWISE, BUT AN ADULT MUST ACCOMPANY YOU HOME AND STAY WITH YOU FOR 24 HOURS.  Name and phone number of your driver:  Special Instructions: N/A              Please read over the following fact sheets you were given: _____________________________________________________________________  [facility] - Preparing for Surgery Before surgery, you can play an important role.  Because skin is not sterile, your skin needs to be as free of germs as possible.  You can reduce the number of germs on your skin by washing with CHG (chlorahexidine gluconate) soap before surgery.  CHG is an antiseptic cleaner which kills germs and bonds with the skin to continue killing germs even after washing. Please DO NOT use if you have an allergy to CHG or antibacterial soaps.  If your skin becomes reddened/irritated stop using the CHG and inform your nurse when you arrive at Short Stay. Do not shave (including legs and underarms) for at least 48 hours prior to the first CHG shower.  You may shave your face/neck. Please follow these instructions carefully:  1.  Shower with CHG Soap the night before surgery and the  morning of Surgery.  2.  If you choose to wash your hair, wash your hair first as usual with your  normal  shampoo.  3.  After you shampoo, rinse your hair and body thoroughly to remove the  shampoo.                           4.  Use CHG as you would any other liquid soap.  You can apply chg directly  to the skin and wash                       Gently with a scrungie or clean washcloth.  5.  Apply the CHG Soap to your body ONLY FROM THE NECK DOWN.   Do not use on face/ open                           Wound or open sores. Avoid contact with eyes, ears mouth and genitals (private parts).                        Wash face,  Genitals (private parts) with your normal soap.             6.  Wash thoroughly, paying special attention to the area where your surgery  will be performed.  7.  Thoroughly rinse your body with warm water from the neck down.  8.  DO NOT shower/wash with your normal soap after using and rinsing off  the CHG Soap.                9.  Pat yourself dry with a clean towel.            10.  Wear clean  pajamas.            11.  Place clean sheets on your bed the night of your first shower and do not  sleep with pets. Day of Surgery : Do not apply any lotions/deodorants the morning of surgery.  Please wear clean clothes to the hospital/surgery center.  FAILURE TO FOLLOW THESE INSTRUCTIONS MAY RESULT IN THE CANCELLATION OF YOUR SURGERY PATIENT SIGNATURE_________________________________  NURSE SIGNATURE__________________________________  ________________________________________________________________________

## 2021-05-12 ENCOUNTER — Encounter (HOSPITAL_COMMUNITY): Payer: Self-pay

## 2021-05-12 ENCOUNTER — Encounter (HOSPITAL_COMMUNITY)
Admission: RE | Admit: 2021-05-12 | Discharge: 2021-05-12 | Disposition: A | Discharge status: Home / Self Care | Payer: Medicare Other | Source: Ambulatory Visit | Attending: Orthopaedic Surgery | Admitting: Orthopaedic Surgery

## 2021-05-12 ENCOUNTER — Other Ambulatory Visit: Payer: Self-pay

## 2021-05-12 VITALS — BP 151/78 | HR 73 | Temp 97.8°F | Resp 16 | Ht 66.0 in | Wt 200.0 lb

## 2021-05-12 DIAGNOSIS — Z01812 Encounter for preprocedural laboratory examination: Secondary | ICD-10-CM | POA: Diagnosis present

## 2021-05-12 DIAGNOSIS — K219 Gastro-esophageal reflux disease without esophagitis: Secondary | ICD-10-CM

## 2021-05-12 DIAGNOSIS — Z01818 Encounter for other preprocedural examination: Secondary | ICD-10-CM

## 2021-05-12 HISTORY — DX: Unspecified osteoarthritis, unspecified site: M19.90

## 2021-05-12 HISTORY — DX: Other complications of anesthesia, initial encounter: T88.59XA

## 2021-05-12 HISTORY — DX: Malignant (primary) neoplasm, unspecified: C80.1

## 2021-05-12 LAB — CBC
HCT: 42.3 % (ref 36.0–46.0)
Hemoglobin: 13.8 g/dL (ref 12.0–15.0)
MCH: 30.3 pg (ref 26.0–34.0)
MCHC: 32.6 g/dL (ref 30.0–36.0)
MCV: 93 fL (ref 80.0–100.0)
Platelets: 230 10*3/uL (ref 150–400)
RBC: 4.55 MIL/uL (ref 3.87–5.11)
RDW: 14.5 % (ref 11.5–15.5)
WBC: 8 10*3/uL (ref 4.0–10.5)
nRBC: 0 % (ref 0.0–0.2)

## 2021-05-12 LAB — SURGICAL PCR SCREEN
MRSA, PCR: NEGATIVE
Staphylococcus aureus: NEGATIVE

## 2021-05-12 NOTE — Progress Notes (Signed)
[facility]- Preparing for Total Shoulder Arthroplasty  °  °Before surgery, you can play an important role. Because skin is not sterile, your skin needs to be as free of germs as possible. You can reduce the number of germs on your skin by using the following products. °Benzoyl Peroxide Gel °Reduces the number of germs present on the skin °Applied twice a day to shoulder area starting two days before surgery   ° °================================================================== ° °Please follow these instructions carefully: ° °BENZOYL PEROXIDE 5% GEL ° °Please do not use if you have an allergy to benzoyl peroxide.   If your skin becomes reddened/irritated stop using the benzoyl peroxide. ° °Starting two days before surgery, apply as follows: °Apply benzoyl peroxide in the morning and at night. Apply after taking a shower. If you are not taking a shower clean entire shoulder front, back, and side along with the armpit with a clean wet washcloth. ° °Place a quarter-sized dollop on your shoulder and rub in thoroughly, making sure to cover the front, back, and side of your shoulder, along with the armpit.  ° °2 days before ____ AM   ____ PM              1 day before ____ AM   ____ PM °                        °Do this twice a day for two days.  (Last application is the night before surgery, AFTER using the CHG soap as described below). ° °Do NOT apply benzoyl peroxide gel on the day of surgery.  °

## 2021-05-14 ENCOUNTER — Other Ambulatory Visit: Payer: Self-pay

## 2021-05-14 ENCOUNTER — Ambulatory Visit
Admission: RE | Admit: 2021-05-14 | Discharge: 2021-05-14 | Disposition: A | Discharge status: Home / Self Care | Payer: Medicare Other | Source: Ambulatory Visit | Attending: Orthopaedic Surgery | Admitting: Orthopaedic Surgery

## 2021-05-14 DIAGNOSIS — M25511 Pain in right shoulder: Secondary | ICD-10-CM

## 2021-05-19 NOTE — H&P (Signed)
PREOPERATIVE H&P  Chief Complaint: right shoulder infection  HPI: Carol Norton is a 75 y.o. female who is scheduled for Procedure(s): [place].   Patient has a past medical history significant for skin cancer, complication of anesthesia, GERD, HLD, hypothyroidism, sleep apnea on CPAP.   Carol Norton is a pleasant 75 year old woman here for evaluation of right shoulder problems.  She had a rotator cuff tear that was repaired.  This was an irreparable tear back in August.  She has a fall in June.  After surgery she developed significant pain.  She had some episodes with physical therapy where she may have overdone it.  Either way, she has had dramatic loss of function.  She denies fevers or chills.  She never had drainage or wound healing problems.  She now has extremely poor function, much worse than she did even before her initial index operation.    His symptoms are rated as moderate to severe, and have been worsening.  This is significantly impairing activities of daily living.    Please see clinic note for further details on this patient's care.    She has elected for surgical management.   Past Medical History:  Diagnosis Date   Allergic rhinitis    Allergy    Anxiety    Arthritis    Bilateral carpal tunnel syndrome    Patient scheduled for surgery with neurology   Cancer [facility])    skin cancer   Cataract    Chronic constipation    Chronic sinusitis    CN (constipation) 16/02/9603   Complication of anesthesia    with nerve block had hard time taking a breath for a day after surgery wtih ketamine slow to wake up   COVID-19 virus infection 05/2019   GERD (gastroesophageal reflux disease)    Glaucoma    Glaucoma    Hyperlipemia    Hypothyroid    Leg length discrepancy    Ocular rosacea    Plantar fasciitis    Sebaceous cyst    Sleep apnea    on CPAP   Urethral stenosis    Vitamin D deficiency    Past Surgical History:  Procedure  Laterality Date   ABDOMINAL HYSTERECTOMY  1983   BREAST SURGERY     sebaceous cyst   DENTAL SURGERY  11/10/2020   EYE SURGERY     surgery with laser   FACIAL COSMETIC SURGERY     1995   GANGLION CYST EXCISION     MENISCUS REPAIR Left    RHINOPLASTY     right carpal tunnel      right rotator cuff surgery      ROTATOR CUFF REPAIR Right 01/06/2021   SEPTOPLASTY     TUBAL LIGATION     Social History   Socioeconomic History   Marital status: Married    Spouse name: Carol Norton   Number of children: 2   Years of education: 13   Highest education level: Not on file  Occupational History   Occupation: retired  Tobacco Use   Smoking status: Former    Packs/day: 0.75    Years: 25.00    Pack years: 18.75    Types: Cigarettes    Quit date: 05/25/2007    Years since quitting: 13.9   Smokeless tobacco: Never   Tobacco comments:    quit for 10 years in the total 102yrs.   Vaping Use   Vaping Use: Never used  Substance and Sexual Activity  Alcohol use: No    Comment: rare   Drug use: No   Sexual activity: Yes    Partners: Male    Birth control/protection: Surgical    Comment: Married  Other Topics Concern   Not on file  Social History Narrative   Married to Carol Norton. 2 children.   Dinks caffeine.   Patient is right handed.    Exercises routinely.   Wears seatbelt, smoke detector at home.   Feels safe in relationship.   Social Determinants of Health   Financial Resource Strain: Not on file  Food Insecurity: Not on file  Transportation Needs: Not on file  Physical Activity: Not on file  Stress: Not on file  Social Connections: Not on file   Family History  Problem Relation Age of Onset   Emphysema Father    Alcohol abuse Father    Allergies Mother    Heart disease Mother    Ovarian cancer Mother    Hyperlipidemia Mother    Hypothyroidism Mother    Arthritis Mother    Hypothyroidism Sister    Lung cancer Paternal Aunt    Diabetes Maternal Grandmother    Heart  disease Maternal Grandmother    Stroke Paternal Grandfather    Allergies  Allergen Reactions   Synthroid [Levothyroxine Sodium] Shortness Of Breath   Oxycodone-Acetaminophen Itching   Accolate [Zafirlukast]     Unknown reaction    Avelox [Moxifloxacin Hcl In Nacl]     Increased pulse rate and bp   Citalopram     Unknown reaction   Dexilant [Dexlansoprazole]     Unknown reaction    Escitalopram Oxalate     Couldn't function, lethargic    Nsaids     Hair loss, couldn't empty bladder    Restasis [Cyclosporine]     Nasal/throat issues    Singulair [Montelukast Sodium]     Aggression,anger   Statins     Myalgias, joint pain. Elevated bp and heart rate    Tramadol     Burning of mouth   Wellbutrin [Bupropion] Hives   Dymista [Azelastine-Fluticasone]    Prior to Admission medications   Medication Sig Start Date End Date Taking? Authorizing Provider  ALPRAZolam Duanne Moron) 0.5 MG tablet Take 1 tablet (0.5 mg total) by mouth 2 (two) times daily. Patient taking differently: Take 0.5 mg by mouth 2 (two) times daily as needed for anxiety. 03/09/21  Yes Kuneff, Renee A, DO  ARMOUR THYROID 60 MG tablet Take one tablet daily 03/09/21  Yes Kuneff, Renee A, DO  Bioflavonoid Products (VITAMIN C) CHEW Chew 2-4 tablets by mouth daily as needed (immune support).   Yes [provider]  calcium carbonate (TUMS - DOSED IN MG ELEMENTAL CALCIUM) 500 MG chewable tablet Chew 1 tablet by mouth daily as needed for indigestion or heartburn.   Yes [provider]  Cholecalciferol (VITAMIN D) 125 MCG (5000 UT) CAPS Take 10,000 capsules by mouth daily.   Yes [provider]  diclofenac sodium (VOLTAREN) 1 % GEL Apply 1 application topically 4 (four) times daily as needed (pain).   Yes [provider]  diphenhydrAMINE (BENADRYL) 25 MG tablet Take 25 mg by mouth daily as needed for allergies.   Yes [provider]  guaiFENesin (MUCINEX) 600 MG 12 hr tablet Take by mouth 2  (two) times daily as needed (congestion).   Yes [provider]  Magnesium 400 MG TABS Take 400 mg by mouth daily.   Yes [provider]  methocarbamol (ROBAXIN) 500  MG tablet Take 500 mg by mouth 3 (three) times daily as needed for muscle spasms. 09/06/18  Yes [provider]  Multiple Vitamins-Minerals (ICAPS AREDS 2 PO) Take 1 capsule by mouth 2 (two) times daily.   Yes [provider]  naproxen sodium (ALEVE) 220 MG tablet Take 220 mg by mouth daily as needed (pain).   Yes [provider]  NON FORMULARY Apply 1 application topically 4 (four) times daily as needed. KETAMINE HCL/BACLOFEN/CYCLOBENZAPRINE HCL/DICLOFENAC SOD/GABAPENTIN/LIDOCAINE HCL (TOPI)   Yes [provider]  Olopatadine HCl 0.6 % SOLN Place 2 sprays into both nostrils 2 (two) times daily. 03/17/21  Yes Kozlow, Donnamarie Poag, MD  polyethylene glycol (MIRALAX / GLYCOLAX) 17 g packet Take 17 g by mouth at bedtime.   Yes [provider]  PRESCRIPTION MEDICATION Place 1 drop into both eyes 3 (three) times daily as needed (dry eyes). Vital Tears compounded 30% autologous serum eye drops   Yes [provider]  sodium chloride (OCEAN) 0.65 % SOLN nasal spray Place 1 spray into both nostrils as needed for congestion.   Yes [provider]  valACYclovir (VALTREX) 1000 MG tablet 2 tabs at onset of cold sore and repeat dose once 12 hours later. 01/14/21  Yes Kuneff, Renee A, DO    ROS: All other systems have been reviewed and were otherwise negative with the exception of those mentioned in the HPI and as above.  Physical Exam: General: Alert, no acute distress Cardiovascular: No pedal edema Respiratory: No cyanosis, no use of accessory musculature GI: No organomegaly, abdomen is soft and non-tender Skin: No lesions in the area of chief complaint Neurologic: Sensation intact distally Psychiatric: Patient is competent for consent with normal mood and  affect Lymphatic: No axillary or cervical lymphadenopathy  MUSCULOSKELETAL:  Right shoulder: Active motion is 0 to about 40 degrees.  She has substantial pain with any kind of activation of the shoulder.    Imaging: MRI demonstrates a massive supraspinatus and infraspinatus tear with retraction, that is done postoperatively.  Assessment: right shoulder pain, possible infection  Plan: Plan for Procedure(s): REVERSE SHOULDER ARTHROPLASTY SYNOVECTOMY  The risks benefits and alternatives were discussed with the patient including but not limited to the risks of nonoperative treatment, versus surgical intervention including infection, bleeding, nerve injury,  blood clots, cardiopulmonary complications, morbidity, mortality, among others, and they were willing to proceed.   We additionally specifically discussed risks of axillary nerve injury, infection, periprosthetic fracture, continued pain and longevity of implants prior to beginning procedure.    Patient will be closely monitored in PACU for medical stabilization and pain control. If found stable in PACU, patient may be discharged home with outpatient follow-up. If any concerns regarding patient's stabilization patient will be admitted for observation after surgery. The patient is planning to be discharged home with outpatient PT.   The patient acknowledged the explanation, agreed to proceed with the plan and consent was signed.   Operative Plan: Right reverse total shoulder arthroplasty versus hemiarthroplasty with antibiotic spacer Discharge Medications: Tylenol, Oxycodone, Zofran (+/-abx) DVT Prophylaxis: Aspirin Physical Therapy: Outpatient PT Special Discharge needs: Sling. Blennerhassett, PA-C  05/19/2021 7:41 AM

## 2021-05-19 NOTE — Discharge Instructions (Signed)
Ophelia Charter MD, MPH Noemi Chapel, PA-C [place] [address] [phone] (tel)   [phone] (fax)   Sloan may leave the operative dressing in place until your follow-up appointment. KEEP THE INCISIONS CLEAN AND DRY. There may be a small amount of fluid/bleeding leaking at the surgical site. This is normal after surgery.  If it fills with liquid or blood please call us immediately to change it for you. Use the provided ice machine or Ice packs as often as possible for the first 3-4 days, then as needed for pain relief.  Keep a layer of cloth or a shirt between your skin and the cooling unit to prevent frost bite as it can get very cold.  SHOWERING: - You may shower on Post-Op Day #2.  - The dressing is water resistant but do not scrub it as it may start to peel up.   - You may remove the sling for showering, but keep a water resistant pillow under the arm to keep both the  elbow and shoulder away from the body (mimicking the abduction sling).  - Gently pat the area dry.  - Do not soak the shoulder in water. Do not go swimming in the pool or ocean until your incision has completely healed (about 4-6 weeks after surgery) - KEEP THE INCISIONS CLEAN AND DRY.  EXERCISES Wear the sling at all times You may remove the sling for showering, but keep the arm across the chest or in a secondary sling.    Accidental/Purposeful External Rotation and shoulder flexion (reaching behind you) is to be avoided at all costs for the first month. It is ok to come out of your sling if your are sitting and have assistance for eating.   Do not lift anything heavier than 1 pound until we discuss it further in clinic.   REGIONAL ANESTHESIA (NERVE BLOCKS) The anesthesia team may have performed a nerve block for you if safe in the setting of your care.  This is a great tool used to minimize pain.   Typically the block may start wearing off overnight but the long acting medicine may last for 3-4 days.  The nerve block wearing off can be a challenging period but please utilize your as needed pain medications to try and manage this period.    POST-OP MEDICATIONS- Multimodal approach to pain control In general your pain will be controlled with a combination of substances.  Prescriptions unless otherwise discussed are electronically sent to your pharmacy.  This is a carefully made plan we use to minimize narcotic use.     Acetaminophen - Non-narcotic pain medicine taken on a scheduled basis  Dilaudid - This is a strong narcotic, to be used only on an as needed basis for SEVERE pain. Aspirin 81mg  - This medicine is used to minimize the risk of blood clots after surgery. Zofran -  take as needed for nausea Doxycycline - this is an antibiotic, take as instructed    FOLLOW-UP If you develop a Fever (>101.5), Redness or Drainage from the surgical incision site, please call our office to arrange for an evaluation. Please call the office to schedule a follow-up appointment for a wound check, 7-10 days post-operatively.  IF YOU HAVE ANY QUESTIONS, PLEASE FEEL FREE TO CALL OUR OFFICE.  HELPFUL INFORMATION  If you had a block, it will wear off between 8-24 hrs postop typically.  This is period  when your pain may go from nearly zero to the pain you would have had post-op without the block.  This is an abrupt transition but nothing dangerous is happening.  You may take an extra dose of narcotic when this happens.  Your arm will be in a sling following surgery. You will be in this sling for the next 4 weeks.  We will let you know the exact duration at your follow-up visit.  You may be more comfortable sleeping in a semi-seated position the first few nights following surgery.  Keep a pillow propped under the elbow and forearm for comfort.  If you have a recliner type of chair it might be beneficial.  If  not that is fine too, but it would be helpful to sleep propped up with pillows behind your operated shoulder as well under your elbow and forearm.  This will reduce pulling on the suture lines.  When dressing, put your operative arm in the sleeve first.  When getting undressed, take your operative arm out last.  Loose fitting, button-down shirts are recommended.  In most states it is against the law to drive while your arm is in a sling. And certainly against the law to drive while taking narcotics.  You may return to work/school in the next couple of days when you feel up to it. Desk work and typing in the sling is fine.  We suggest you use the pain medication the first night prior to going to bed, in order to ease any pain when the anesthesia wears off. You should avoid taking pain medications on an empty stomach as it will make you nauseous.  Do not drink alcoholic beverages or take illicit drugs when taking pain medications.  Pain medication may make you constipated.  Below are a few solutions to try in this order: Decrease the amount of pain medication if you arent having pain. Drink lots of decaffeinated fluids. Drink prune juice and/or each dried prunes  If the first 3 dont work start with additional solutions Take Colace - an over-the-counter stool softener Take Senokot - an over-the-counter laxative Take Miralax - a stronger over-the-counter laxative   Dental Antibiotics:  In most cases prophylactic antibiotics for Dental procdeures after total joint surgery are not necessary.  Exceptions are as follows:  1. History of prior total joint infection  2. Severely immunocompromised (Organ Transplant, cancer chemotherapy, Rheumatoid biologic meds such as Corinne)  3. Poorly controlled diabetes (A1C &gt; 8.0, blood glucose over 200)  If you have one of these conditions, contact your surgeon for an antibiotic prescription, prior to your dental procedure.   For more  information including helpful videos and documents visit our website:   [email or web address]

## 2021-05-20 ENCOUNTER — Encounter (HOSPITAL_COMMUNITY): Payer: Self-pay | Admitting: Orthopaedic Surgery

## 2021-05-20 ENCOUNTER — Ambulatory Visit (HOSPITAL_COMMUNITY): Payer: Medicare Other

## 2021-05-20 ENCOUNTER — Ambulatory Visit (HOSPITAL_COMMUNITY): Payer: Medicare Other | Admitting: Certified Registered Nurse Anesthetist

## 2021-05-20 ENCOUNTER — Encounter (HOSPITAL_COMMUNITY)
Admission: RE | Disposition: A | Discharge status: Home / Self Care | Payer: Self-pay | Source: Ambulatory Visit | Attending: Orthopaedic Surgery

## 2021-05-20 ENCOUNTER — Ambulatory Visit (HOSPITAL_COMMUNITY): Payer: Medicare Other | Admitting: Physician Assistant

## 2021-05-20 ENCOUNTER — Ambulatory Visit (HOSPITAL_COMMUNITY)
Admission: RE | Admit: 2021-05-20 | Discharge: 2021-05-20 | Disposition: A | Discharge status: Home / Self Care | Payer: Medicare Other | Source: Ambulatory Visit | Attending: Orthopaedic Surgery | Admitting: Orthopaedic Surgery

## 2021-05-20 DIAGNOSIS — G4730 Sleep apnea, unspecified: Secondary | ICD-10-CM | POA: Insufficient documentation

## 2021-05-20 DIAGNOSIS — E669 Obesity, unspecified: Secondary | ICD-10-CM | POA: Insufficient documentation

## 2021-05-20 DIAGNOSIS — Z85828 Personal history of other malignant neoplasm of skin: Secondary | ICD-10-CM | POA: Insufficient documentation

## 2021-05-20 DIAGNOSIS — F419 Anxiety disorder, unspecified: Secondary | ICD-10-CM | POA: Insufficient documentation

## 2021-05-20 DIAGNOSIS — E785 Hyperlipidemia, unspecified: Secondary | ICD-10-CM | POA: Diagnosis not present

## 2021-05-20 DIAGNOSIS — Z87891 Personal history of nicotine dependence: Secondary | ICD-10-CM | POA: Insufficient documentation

## 2021-05-20 DIAGNOSIS — K219 Gastro-esophageal reflux disease without esophagitis: Secondary | ICD-10-CM | POA: Diagnosis not present

## 2021-05-20 DIAGNOSIS — Z6832 Body mass index (BMI) 32.0-32.9, adult: Secondary | ICD-10-CM | POA: Diagnosis not present

## 2021-05-20 DIAGNOSIS — Z09 Encounter for follow-up examination after completed treatment for conditions other than malignant neoplasm: Secondary | ICD-10-CM

## 2021-05-20 DIAGNOSIS — Z9189 Other specified personal risk factors, not elsewhere classified: Secondary | ICD-10-CM | POA: Diagnosis not present

## 2021-05-20 DIAGNOSIS — G709 Myoneural disorder, unspecified: Secondary | ICD-10-CM | POA: Insufficient documentation

## 2021-05-20 DIAGNOSIS — M19011 Primary osteoarthritis, right shoulder: Secondary | ICD-10-CM | POA: Diagnosis not present

## 2021-05-20 DIAGNOSIS — M75121 Complete rotator cuff tear or rupture of right shoulder, not specified as traumatic: Secondary | ICD-10-CM | POA: Diagnosis not present

## 2021-05-20 DIAGNOSIS — M1990 Unspecified osteoarthritis, unspecified site: Secondary | ICD-10-CM | POA: Insufficient documentation

## 2021-05-20 DIAGNOSIS — M25711 Osteophyte, right shoulder: Secondary | ICD-10-CM | POA: Diagnosis not present

## 2021-05-20 DIAGNOSIS — E039 Hypothyroidism, unspecified: Secondary | ICD-10-CM | POA: Diagnosis not present

## 2021-05-20 HISTORY — PX: REVERSE SHOULDER ARTHROPLASTY: SHX5054

## 2021-05-20 HISTORY — PX: SYNOVECTOMY: SHX5180

## 2021-05-20 SURGERY — ARTHROPLASTY, SHOULDER, TOTAL, REVERSE
Anesthesia: Regional | Site: Shoulder | Laterality: Right

## 2021-05-20 MED ORDER — PHENYLEPHRINE 40 MCG/ML (10ML) SYRINGE FOR IV PUSH (FOR BLOOD PRESSURE SUPPORT)
PREFILLED_SYRINGE | INTRAVENOUS | Status: DC | PRN
  Administered 2021-05-20 (×3): 80 ug via INTRAVENOUS

## 2021-05-20 MED ORDER — ONDANSETRON HCL 4 MG PO TABS: 4.0000 mg | ORAL_TABLET | Freq: Three times a day (TID) | ORAL | 0 refills | Status: AC | PRN

## 2021-05-20 MED ORDER — DEXAMETHASONE SODIUM PHOSPHATE 10 MG/ML IJ SOLN
INTRAMUSCULAR | Status: AC
  Filled 2021-05-20: qty 1

## 2021-05-20 MED ORDER — ROCURONIUM BROMIDE 10 MG/ML (PF) SYRINGE
PREFILLED_SYRINGE | INTRAVENOUS | Status: AC
  Filled 2021-05-20: qty 10

## 2021-05-20 MED ORDER — METHOCARBAMOL 500 MG PO TABS: 500.0000 mg | ORAL_TABLET | Freq: Four times a day (QID) | ORAL | Status: DC | PRN

## 2021-05-20 MED ORDER — DEXMEDETOMIDINE (PRECEDEX) IN NS 20 MCG/5ML (4 MCG/ML) IV SYRINGE
PREFILLED_SYRINGE | INTRAVENOUS | Status: DC | PRN
  Administered 2021-05-20 (×2): 8 ug via INTRAVENOUS

## 2021-05-20 MED ORDER — METHOCARBAMOL 500 MG IVPB - SIMPLE MED
INTRAVENOUS | Status: AC
  Filled 2021-05-20: qty 50

## 2021-05-20 MED ORDER — SUGAMMADEX SODIUM 200 MG/2ML IV SOLN
INTRAVENOUS | Status: DC | PRN
  Administered 2021-05-20: 200 mg via INTRAVENOUS

## 2021-05-20 MED ORDER — ORAL CARE MOUTH RINSE: 15.0000 mL | Freq: Once | OROMUCOSAL | Status: AC

## 2021-05-20 MED ORDER — VANCOMYCIN HCL 1 G IV SOLR
INTRAVENOUS | Status: DC | PRN
  Administered 2021-05-20: 1000 mg via TOPICAL

## 2021-05-20 MED ORDER — CEFAZOLIN SODIUM-DEXTROSE 2-4 GM/100ML-% IV SOLN
2.0000 g | INTRAVENOUS | Status: AC
  Administered 2021-05-20: 10:00:00 2 g via INTRAVENOUS
  Filled 2021-05-20: qty 100

## 2021-05-20 MED ORDER — HYDROMORPHONE HCL 2 MG PO TABS
2.0000 mg | ORAL_TABLET | Freq: Four times a day (QID) | ORAL | 0 refills | Status: DC | PRN
Start: 2021-05-20 — End: 2021-08-19

## 2021-05-20 MED ORDER — VANCOMYCIN HCL 1000 MG IV SOLR
INTRAVENOUS | Status: AC
  Filled 2021-05-20: qty 20

## 2021-05-20 MED ORDER — ONDANSETRON HCL 4 MG/2ML IJ SOLN
INTRAMUSCULAR | Status: DC | PRN
  Administered 2021-05-20: 4 mg via INTRAVENOUS

## 2021-05-20 MED ORDER — ACETAMINOPHEN 500 MG PO TABS: 1000.0000 mg | ORAL_TABLET | Freq: Three times a day (TID) | ORAL | 0 refills | Status: AC

## 2021-05-20 MED ORDER — ROCURONIUM BROMIDE 10 MG/ML (PF) SYRINGE
PREFILLED_SYRINGE | INTRAVENOUS | Status: DC | PRN
  Administered 2021-05-20: 40 mg via INTRAVENOUS
  Administered 2021-05-20 (×2): 10 mg via INTRAVENOUS

## 2021-05-20 MED ORDER — TRANEXAMIC ACID-NACL 1000-0.7 MG/100ML-% IV SOLN
1000.0000 mg | INTRAVENOUS | Status: AC
  Administered 2021-05-20: 11:00:00 1000 mg via INTRAVENOUS
  Filled 2021-05-20: qty 100

## 2021-05-20 MED ORDER — CHLORHEXIDINE GLUCONATE 0.12 % MT SOLN
15.0000 mL | Freq: Once | OROMUCOSAL | Status: AC
  Administered 2021-05-20: 08:00:00 15 mL via OROMUCOSAL

## 2021-05-20 MED ORDER — 0.9 % SODIUM CHLORIDE (POUR BTL) OPTIME
TOPICAL | Status: DC | PRN
  Administered 2021-05-20: 11:00:00 1000 mL

## 2021-05-20 MED ORDER — FENTANYL CITRATE PF 50 MCG/ML IJ SOSY
50.0000 ug | PREFILLED_SYRINGE | Freq: Once | INTRAMUSCULAR | Status: DC
  Filled 2021-05-20: qty 2

## 2021-05-20 MED ORDER — FENTANYL CITRATE (PF) 100 MCG/2ML IJ SOLN
INTRAMUSCULAR | Status: DC | PRN
  Administered 2021-05-20 (×2): 25 ug via INTRAVENOUS
  Administered 2021-05-20: 50 ug via INTRAVENOUS

## 2021-05-20 MED ORDER — METHOCARBAMOL 500 MG IVPB - SIMPLE MED
500.0000 mg | Freq: Four times a day (QID) | INTRAVENOUS | Status: DC | PRN
  Administered 2021-05-20: 13:00:00 500 mg via INTRAVENOUS

## 2021-05-20 MED ORDER — PROPOFOL 10 MG/ML IV BOLUS
INTRAVENOUS | Status: AC
  Filled 2021-05-20: qty 20

## 2021-05-20 MED ORDER — DEXAMETHASONE SODIUM PHOSPHATE 10 MG/ML IJ SOLN
INTRAMUSCULAR | Status: DC | PRN
  Administered 2021-05-20: 5 mg via INTRAVENOUS

## 2021-05-20 MED ORDER — GABAPENTIN 300 MG PO CAPS
300.0000 mg | ORAL_CAPSULE | Freq: Once | ORAL | Status: DC
  Filled 2021-05-20: qty 1

## 2021-05-20 MED ORDER — LACTATED RINGERS IV BOLUS
500.0000 mL | Freq: Once | INTRAVENOUS | Status: AC
  Administered 2021-05-20: 12:00:00 500 mL via INTRAVENOUS

## 2021-05-20 MED ORDER — FENTANYL CITRATE (PF) 100 MCG/2ML IJ SOLN
INTRAMUSCULAR | Status: AC
  Filled 2021-05-20: qty 2

## 2021-05-20 MED ORDER — PROPOFOL 10 MG/ML IV BOLUS
INTRAVENOUS | Status: DC | PRN
  Administered 2021-05-20: 200 mg via INTRAVENOUS

## 2021-05-20 MED ORDER — LACTATED RINGERS IV SOLN: INTRAVENOUS | Status: DC

## 2021-05-20 MED ORDER — DEXMEDETOMIDINE (PRECEDEX) IN NS 20 MCG/5ML (4 MCG/ML) IV SYRINGE
PREFILLED_SYRINGE | INTRAVENOUS | Status: AC
  Filled 2021-05-20: qty 5

## 2021-05-20 MED ORDER — AMISULPRIDE (ANTIEMETIC) 5 MG/2ML IV SOLN: 10.0000 mg | Freq: Once | INTRAVENOUS | Status: DC | PRN

## 2021-05-20 MED ORDER — BUPIVACAINE LIPOSOME 1.3 % IJ SUSP
INTRAMUSCULAR | Status: DC | PRN
  Administered 2021-05-20: 10 mL via PERINEURAL

## 2021-05-20 MED ORDER — LACTATED RINGERS IV BOLUS
250.0000 mL | Freq: Once | INTRAVENOUS | Status: AC
  Administered 2021-05-20: 13:00:00 250 mL via INTRAVENOUS

## 2021-05-20 MED ORDER — LIDOCAINE 2% (20 MG/ML) 5 ML SYRINGE
INTRAMUSCULAR | Status: DC | PRN
  Administered 2021-05-20: 40 mg via INTRAVENOUS

## 2021-05-20 MED ORDER — TOBRAMYCIN SULFATE 1.2 G IJ SOLR
INTRAMUSCULAR | Status: DC | PRN
  Administered 2021-05-20: 1.2 g via TOPICAL

## 2021-05-20 MED ORDER — PHENYLEPHRINE HCL-NACL 20-0.9 MG/250ML-% IV SOLN
INTRAVENOUS | Status: DC | PRN
  Administered 2021-05-20: 50 ug/min via INTRAVENOUS
  Administered 2021-05-20: 25 ug/min via INTRAVENOUS

## 2021-05-20 MED ORDER — ONDANSETRON HCL 4 MG/2ML IJ SOLN
INTRAMUSCULAR | Status: AC
  Filled 2021-05-20: qty 2

## 2021-05-20 MED ORDER — DOXYCYCLINE HYCLATE 50 MG PO CAPS: 100.0000 mg | ORAL_CAPSULE | Freq: Two times a day (BID) | ORAL | 0 refills | Status: AC

## 2021-05-20 MED ORDER — BUPIVACAINE HCL (PF) 0.5 % IJ SOLN
INTRAMUSCULAR | Status: DC | PRN
  Administered 2021-05-20: 15 mL via PERINEURAL

## 2021-05-20 MED ORDER — ACETAMINOPHEN 500 MG PO TABS
1000.0000 mg | ORAL_TABLET | Freq: Once | ORAL | Status: AC
  Administered 2021-05-20: 08:00:00 1000 mg via ORAL
  Filled 2021-05-20: qty 2

## 2021-05-20 MED ORDER — STERILE WATER FOR IRRIGATION IR SOLN
Status: DC | PRN
  Administered 2021-05-20: 2000 mL

## 2021-05-20 MED ORDER — MIDAZOLAM HCL 2 MG/2ML IJ SOLN
1.0000 mg | Freq: Once | INTRAMUSCULAR | Status: AC
  Administered 2021-05-20: 09:00:00 2 mg via INTRAVENOUS
  Filled 2021-05-20: qty 2

## 2021-05-20 MED ORDER — ONDANSETRON HCL 4 MG/2ML IJ SOLN: 4.0000 mg | Freq: Once | INTRAMUSCULAR | Status: DC | PRN

## 2021-05-20 MED ORDER — FENTANYL CITRATE PF 50 MCG/ML IJ SOSY
PREFILLED_SYRINGE | INTRAMUSCULAR | Status: AC
  Filled 2021-05-20: qty 3

## 2021-05-20 MED ORDER — TOBRAMYCIN SULFATE 1.2 G IJ SOLR
INTRAMUSCULAR | Status: AC
  Filled 2021-05-20: qty 1.2

## 2021-05-20 MED ORDER — FENTANYL CITRATE PF 50 MCG/ML IJ SOSY
25.0000 ug | PREFILLED_SYRINGE | INTRAMUSCULAR | Status: DC | PRN
  Administered 2021-05-20: 13:00:00 50 ug via INTRAVENOUS

## 2021-05-20 MED ORDER — SODIUM CHLORIDE 0.9 % IR SOLN
Status: DC | PRN
  Administered 2021-05-20: 3000 mL

## 2021-05-20 SURGICAL SUPPLY — 67 items
BAG COUNTER SPONGE SURGICOUNT (BAG) ×3 IMPLANT
BAG SURGICOUNT SPONGE COUNTING (BAG) ×1
BASEPLATE GLENOSPHERE 25 STD (Miscellaneous) ×1 IMPLANT
BASEPLATE GLENOSPHERE 25MM STD (Miscellaneous) ×1 IMPLANT
BIT DRILL 3.2 PERIPHERAL SCREW (BIT) ×2 IMPLANT
BLADE SAW SAG 73X25 THK (BLADE) ×2
BLADE SAW SGTL 73X25 THK (BLADE) ×2 IMPLANT
CHLORAPREP W/TINT 26 (MISCELLANEOUS) ×8 IMPLANT
CLOSURE STERI-STRIP 1/2X4 (GAUZE/BANDAGES/DRESSINGS) ×1
CLSR STERI-STRIP ANTIMIC 1/2X4 (GAUZE/BANDAGES/DRESSINGS) ×3 IMPLANT
COOLER ICEMAN CLASSIC (MISCELLANEOUS) IMPLANT
COVER BACK TABLE 60X90IN (DRAPES) IMPLANT
COVER SURGICAL LIGHT HANDLE (MISCELLANEOUS) ×4 IMPLANT
DRAPE C-ARM 42X120 X-RAY (DRAPES) IMPLANT
DRAPE INCISE IOBAN 66X45 STRL (DRAPES) ×4 IMPLANT
DRAPE ORTHO SPLIT 77X108 STRL (DRAPES) ×4
DRAPE SHEET LG 3/4 BI-LAMINATE (DRAPES) ×8 IMPLANT
DRAPE SURG ORHT 6 SPLT 77X108 (DRAPES) ×4 IMPLANT
DRSG AQUACEL AG ADV 3.5X 6 (GAUZE/BANDAGES/DRESSINGS) ×4 IMPLANT
ELECT BLADE TIP CTD 4 INCH (ELECTRODE) ×4 IMPLANT
ELECT REM PT RETURN 15FT ADLT (MISCELLANEOUS) ×4 IMPLANT
FACESHIELD WRAPAROUND (MASK) ×4 IMPLANT
FACESHIELD WRAPAROUND OR TEAM (MASK) ×2 IMPLANT
GLENOSPHERE REV SHOULDER 36 (Joint) ×2 IMPLANT
GLOVE SRG 8 PF TXTR STRL LF DI (GLOVE) ×2 IMPLANT
GLOVE SURG ENC MOIS LTX SZ6.5 (GLOVE) ×8 IMPLANT
GLOVE SURG NEOPR MICRO LF SZ8 (GLOVE) ×8 IMPLANT
GLOVE SURG UNDER POLY LF SZ6.5 (GLOVE) ×4 IMPLANT
GLOVE SURG UNDER POLY LF SZ8 (GLOVE) ×2
GOWN STRL REUS W/TWL LRG LVL3 (GOWN DISPOSABLE) ×4 IMPLANT
GOWN STRL REUS W/TWL XL LVL3 (GOWN DISPOSABLE) ×4 IMPLANT
GUIDEWIRE GLENOID 2.5X220 (WIRE) ×2 IMPLANT
HANDPIECE INTERPULSE COAX TIP (DISPOSABLE) ×2
IMPL REVERSE SHOULDER 0X3.5 (Shoulder) IMPLANT
IMPLANT REVERSE SHOULDER 0X3.5 (Shoulder) ×4 IMPLANT
INSERT HUMERAL 36X6MM 12.5DEG (Insert) ×2 IMPLANT
KIT BASIN OR (CUSTOM PROCEDURE TRAY) ×4 IMPLANT
KIT STABILIZATION SHOULDER (MISCELLANEOUS) ×4 IMPLANT
KIT TURNOVER KIT A (KITS) IMPLANT
MANIFOLD NEPTUNE II (INSTRUMENTS) ×4 IMPLANT
NDL MAYO CATGUT SZ4 TPR NDL (NEEDLE) IMPLANT
NEEDLE MAYO CATGUT SZ4 (NEEDLE) IMPLANT
NS IRRIG 1000ML POUR BTL (IV SOLUTION) ×4 IMPLANT
PACK SHOULDER (CUSTOM PROCEDURE TRAY) ×4 IMPLANT
PAD COLD SHLDR WRAP-ON (PAD) IMPLANT
RESTRAINT HEAD UNIVERSAL NS (MISCELLANEOUS) ×4 IMPLANT
SCREW BONE 6.5X40 SM (Screw) ×2 IMPLANT
SCREW PERIPHERAL 30 (Screw) ×2 IMPLANT
SCREW PERIPHERAL 5.0X34 (Screw) ×2 IMPLANT
SET HNDPC FAN SPRY TIP SCT (DISPOSABLE) ×2 IMPLANT
SLING ULTRA II L (ORTHOPEDIC SUPPLIES) IMPLANT
SLING ULTRA III MED (ORTHOPEDIC SUPPLIES) ×4 IMPLANT
STEM HUMERAL SZ2B STND 70 PTC (Stem) ×4 IMPLANT
STEM HUMERAL SZ2BSTD 70 PTC (Stem) IMPLANT
SUCTION FRAZIER HANDLE 12FR (TUBING) ×2
SUCTION TUBE FRAZIER 12FR DISP (TUBING) ×2 IMPLANT
SUT ETHIBOND 2 V 37 (SUTURE) ×4 IMPLANT
SUT ETHIBOND NAB CT1 #1 30IN (SUTURE) ×4 IMPLANT
SUT FIBERWIRE #5 38 CONV NDL (SUTURE)
SUT MNCRL AB 4-0 PS2 18 (SUTURE) ×4 IMPLANT
SUT VIC AB 0 CT1 36 (SUTURE) IMPLANT
SUT VIC AB 3-0 SH 27 (SUTURE) ×2
SUT VIC AB 3-0 SH 27X BRD (SUTURE) ×2 IMPLANT
SUTURE FIBERWR #5 38 CONV NDL (SUTURE) IMPLANT
TOWEL OR 17X26 10 PK STRL BLUE (TOWEL DISPOSABLE) ×4 IMPLANT
TUBE SUCTION HIGH CAP CLEAR NV (SUCTIONS) ×4 IMPLANT
WATER STERILE IRR 1000ML POUR (IV SOLUTION) ×8 IMPLANT

## 2021-05-20 NOTE — Op Note (Signed)
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  Patient's x-ray nerve tug test was normal.  The cuff was completely torn.  There is no obvious visual sign of infection or significant synovitis.  The only sign of any possible infection would be that the patient was relatively tight in regards to her preoperative motion which was only about 50 degrees of forward flexion and 0 external rotation.  This could also have been caused by her pain and a frozen shoulder type picture.  We performed a careful synovectomy and sent samples.  We we will treat the patient is a 1 stage arthroplasty with doxycycline suppression until her cultures are resulted.  Post-operative plan: The patient will be NWB in sling.  The patient will be will be discharged from PACU if continues to be stable as was plan prior to surgery.  DVT prophylaxis Aspirin 81 mg twice daily for 6 weeks.  She will be placed on suppressive doxycycline orally until her cultures are resulted.  Pain control with PRN pain medication preferring oral medicines.  Follow up plan will be scheduled in approximately 7 days for incision check and XR.  Physical therapy to start after first visit.  Implants: Tornier size 2 short flex stem, 0 high offset tray with a +6 polyethylene, 36 Dandre glenosphere with a 25 standard baseplate and 40 center screw  Post-Op Diagnosis: Same Surgeons:Primary: Hiram Gash, MD Assistants:Caroline McBane PA-C Location: [facility] ROOM 06 Anesthesia: General with Exparel Interscalene Antibiotics: Ancef 2g preop, Vancomycin 1050m locally, 1.2 g of tobramycin powder. Tourniquet time: None Estimated Blood Loss:  1626Complications: None Specimens: 3 for culture, right shoulder 1 2 and 3 hold for 3 weeks to rule out C acnes Implants: Implant Name Type Inv. Item Serial No. Manufacturer Lot No. LRB No. Used Action  BASEPLATE GLENOSPHERE 294WNSTD - [id number] BASEPLATE GLENOSPHERE 238HWSTD [id number]  Right 1 Implanted  GLENOSPHERE REV SHOULDER 36 - [id number] GLENOSPHERE REV SHOULDER 36 [id number]  Right 1 Implanted  SCREW BONE 6.5X40 SM - [id number] SCREW BONE 6.5X40 SM  [employer]  Right 1 Implanted  SCREW PERIPHERAL 5.0X34 - [id number] SCREW PERIPHERAL 5.0X34  [employer]  Right 1 Implanted  SCREW PERIPHERAL 30 - [id number] SCREW PERIPHERAL 30  [employer]  Right 1 Implanted  INSERT HUMERAL 36X6MM 12.5DEG - [id number] INSERT HUMERAL 36X6MM 12.5DEG [id number]  Right 1 Implanted  IMPLANT REVERSE SHOULDER 0X3.5 - [id number] IMPLANT REVERSE SHOULDER 0X3.5 [id number]  Right 1 Implanted  STEM HUMERAL SZ2B STND 70 PTC - [id number] STEM HUMERAL SZ2B STND 70 PTC [id number]  Right 1 Implanted    Indications for Surgery:   Carol HOLDENis a 75y.o. female with a complex history in regards to her right shoulder.  She had had a rotator cuff repair arthroscopically with a local provider in August but unfortunately had return her cuff.  The patient had seen multiple providers and multiple hospitals with concern for possible C.acnes infection in the setting of failed cuff repair.  She had a thorough work-up but on aspiration there was some concern visually of possible purulence.  Her sed rate and CRP were  normal.  Based on the concern for purulence we felt that it was appropriate to take the patient urgently for surgery rather than wait for the cultures to result as she regardless had poor function and would be benefited by a reverse total shoulder arthroplasty.  She understood there was a chance for  hemiarthroplasty spacer and a second stage procedure versus essentially treating her as a 1 stage revision arthroplasty.Benefits and risks of operative and nonoperative management were discussed prior to surgery with patient/guardian(s) and informed consent form was completed.  Infection and need for further surgery were discussed as was prosthetic stability and cuff issues.  We additionally specifically discussed risks of axillary nerve injury, infection, periprosthetic fracture, continued pain and longevity of implants prior to beginning procedure.      Procedure:   The patient was identified in the preoperative holding area where the surgical site was marked. Block placed by anesthesia with exparel.  The patient was taken to the OR where a procedural timeout was called and the above noted anesthesia was induced.  The patient was positioned beachchair on allen table with spider arm positioner.  Preoperative antibiotics were dosed.  The patient's right shoulder was prepped and draped in the usual sterile fashion.  A second preoperative timeout was called.       Standard deltopectoral approach was performed with a #10 blade. We dissected down to the subcutaneous tissues and the cephalic vein was taken laterally with the deltoid. Clavipectoral fascia was incised in line with the incision. Deep retractors were placed. The long of the biceps tendon was identified and there was significant tenosynovitis present.  Tenodesis was performed to the pectoralis tendon with #2 Ethibond. The remaining biceps was followed up into the rotator interval where it was released.   The subscapularis was taken down in a full thickness layer with capsule along the humeral neck extending inferiorly around the humeral head. We continued releasing the capsule directly off of the osteophytes inferiorly all the way around the corner. This allowed [place] to dislocate the humeral head.   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   The rotator cuff was carefully examined and noted to be irreperably torn.  The decision was confirmed that a reverse total shoulder was indicated for this patient.  There were osteophytes along the inferior humeral neck. The osteophytes were removed with an osteotome and a rongeur.  Osteophytes were removed with a rongeur and an osteotome and the anatomic neck was well visualized.     A humeral cutting guide was inserted down the intramedullary canal. The version was set at 20 of retroversion. Humeral osteotomy was performed with an oscillating saw. The head fragment was passed off the back table. A starter awl was used to open the humeral canal. We next used T-handle straight sound reamers to ream up to an appropriate fit. A chisel was used to remove proximal humeral bone. We then broached starting with a size one broach and broaching up to 2 which obtained an appropriate fit. The broach handle was removed. A cut protector was placed. The broach handle was removed and a cut protector was placed. The humerus was retracted posteriorly and we turned our attention to glenoid exposure.  The subscapularis was again identified and immediately we took care to palpate the axillary nerve anteriorly and verify its position with gentle palpation as well as the tug test.  We then released the SGHL with bovie  cautery prior to placing a curved mayo at the junction of the anterior glenoid well above the axillary nerve and bluntly dissecting the subscapularis from the capsule.  We then carefully protected the axillary nerve as we gently released the inferior capsule to fully mobilize the subscapularis.  An anterior deltoid retractor was then placed as well as a small Hohmann retractor superiorly.  We took multiple separate samples and sent these for culture taking samples from superficial, humeral side and deep glenoid side.  We  performed a careful synovectomy of the joint.  We irrigated with 3 L normal saline.  The glenoid was relatively intact in the setting of an irreparable cuff tear  The remaining labrum was removed circumferentially taking great care not to disrupt the posterior capsule.   The glenoid drill guide was placed and used to drill a guide pin in the center, inferior position. The glenoid face was then reamed concentrically over the guide wire. The center hole was drilled over the guidepin in a near anatomic angle of version. Next the glenoid vault was drilled back to a depth of 40 mm.  We tapped and then placed a 79m size baseplate with additional 035mlateralization was selected with a 6.5 mm x 40 mm length central screw.  The base plate was screwed into the glenoid vault obtaining secure fixation. We next placed superior and inferior locking screws for additional fixation.  Next a 36 mm glenosphere was selected and impacted onto the baseplate. The center screw was tightened.  We turned attention back to the humeral side. The cut protector was removed. We trialed with multiple size tray and polyethylene options and selected a 6 which provided good stability and range of motion without excess soft tissue tension. The offset was dialed in to match the normal anatomy. The shoulder was trialed.  There was good ROM in all planes and the shoulder was stable with no inferior translation.  The real humeral implants were opened after again confirming sizes.  The trial was removed. #5 Fiberwire x4 sutures passed through the humeral neck for subscap repair. The humeral component was press-fit obtaining a secure fit. A +0 high offset tray was selected and impacted onto the stem.  A 36+6 polyethylene liner was impacted onto the stem.  The joint was reduced and thoroughly irrigated with pulsatile lavage. Subscap was repaired back with #5 Fiberwire sutures through bone tunnels. Hemostasis was obtained. The deltopectoral  interval was reapproximated with #1 Ethibond. The subcutaneous tissues were closed with 2-0 Vicryl and the skin was closed with running monocryl.    The wounds were cleaned and dried and an Aquacel dressing was placed. The drapes taken down. The arm was placed into sling with abduction pillow. Patient was awakened, extubated, and transferred to the recovery room in stable condition. There were no intraoperative complications. The sponge, needle, and attention counts were  correct at the end of the case.       CaNoemi ChapelPA-C, present and scrubbed throughout the case, critical for completion in a timely fashion, and for retraction, instrumentation, closure.

## 2021-05-20 NOTE — Anesthesia Preprocedure Evaluation (Signed)
Anesthesia Evaluation  Patient identified by MRN, date of birth, ID band Patient awake    Reviewed: Allergy & Precautions, NPO status , Patient's Chart, lab work & pertinent test results  Airway Mallampati: II  TM Distance: >3 FB Neck ROM: Full    Dental no notable dental hx.    Pulmonary sleep apnea and Continuous Positive Airway Pressure Ventilation , former smoker,    Pulmonary exam normal breath sounds clear to auscultation       Cardiovascular negative cardio ROS Normal cardiovascular exam Rhythm:Regular Rate:Normal     Neuro/Psych PSYCHIATRIC DISORDERS Anxiety  Neuromuscular disease    GI/Hepatic Neg liver ROS, GERD  Controlled,  Endo/Other  Hypothyroidism   Renal/GU negative Renal ROS     Musculoskeletal  (+) Arthritis ,   Abdominal (+) + obese,   Peds  Hematology negative hematology ROS (+)   Anesthesia Other Findings Right shoulder infection  Reproductive/Obstetrics                             Anesthesia Physical Anesthesia Plan  ASA: 2  Anesthesia Plan: General and Regional   Post-op Pain Management:    Induction: Intravenous  PONV Risk Score and Plan: 3 and Ondansetron, Dexamethasone, Midazolam and Treatment may vary due to age or medical condition  Airway Management Planned: Oral ETT  Additional Equipment:   Intra-op Plan:   Post-operative Plan: Extubation in OR  Informed Consent: I have reviewed the patients History and Physical, chart, labs and discussed the procedure including the risks, benefits and alternatives for the proposed anesthesia with the patient or authorized representative who has indicated his/her understanding and acceptance.     Dental advisory given  Plan Discussed with: CRNA  Anesthesia Plan Comments:         Anesthesia Quick Evaluation

## 2021-05-20 NOTE — Interval H&P Note (Signed)
All questions answered

## 2021-05-20 NOTE — Transfer of Care (Signed)
Immediate Anesthesia Transfer of Care Note  Patient: Carol Norton  Procedure(s) Performed: REVERSE SHOULDER ARTHROPLASTY (Right: Shoulder) SYNOVECTOMY (Right)  Patient Location: PACU  Anesthesia Type:GA combined with regional for post-op pain  Level of Consciousness: awake, drowsy and patient cooperative  Airway & Oxygen Therapy: Patient Spontanous Breathing and Patient connected to face mask oxygen  Post-op Assessment: Report given to RN and Post -op Vital signs reviewed and stable  Post vital signs: Reviewed and stable  Last Vitals:  Vitals Value Taken Time  BP 168/84 05/20/21 1154  Temp    Pulse 63 05/20/21 1158  Resp 14 05/20/21 1158  SpO2 100 % 05/20/21 1158  Vitals shown include unvalidated device data.  Last Pain:  Vitals:   05/20/21 0807  TempSrc:   PainSc: 0-No pain         Complications: No notable events documented.

## 2021-05-20 NOTE — Anesthesia Postprocedure Evaluation (Signed)
Anesthesia Post Note  Patient: Carol Norton  Procedure(s) Performed: REVERSE SHOULDER ARTHROPLASTY (Right: Shoulder) SYNOVECTOMY (Right)     Patient location during evaluation: PACU Anesthesia Type: Regional and General Level of consciousness: awake Pain management: pain level controlled Vital Signs Assessment: post-procedure vital signs reviewed and stable Respiratory status: spontaneous breathing, nonlabored ventilation, respiratory function stable and patient connected to nasal cannula oxygen Cardiovascular status: blood pressure returned to baseline and stable Postop Assessment: no apparent nausea or vomiting Anesthetic complications: no   No notable events documented.  Last Vitals:  Vitals:   05/20/21 1300 05/20/21 1320  BP: (!) 108/52 128/67  Pulse: (!) 57 63  Resp: 11   Temp: 36.7 C   SpO2: 100% 99%    Last Pain:  Vitals:   05/20/21 1320  TempSrc:   PainSc: 0-No pain                 Tonyia Marschall P Terryl Molinelli

## 2021-05-20 NOTE — Anesthesia Procedure Notes (Signed)
Procedure Name: Intubation Date/Time: 05/20/2021 10:21 AM Performed by: West Pugh, CRNA Pre-anesthesia Checklist: Patient identified, Emergency Drugs available, Suction available, Patient being monitored and Timeout performed Patient Re-evaluated:Patient Re-evaluated prior to induction Oxygen Delivery Method: Circle system utilized Preoxygenation: Pre-oxygenation with 100% oxygen Induction Type: IV induction Ventilation: Mask ventilation without difficulty and Mask ventilation throughout procedure Laryngoscope Size: 3 and Glidescope Grade View: Grade I Tube type: Oral Tube size: 7.0 mm Number of attempts: 1 Airway Equipment and Method: Stylet and Video-laryngoscopy Placement Confirmation: ETT inserted through vocal cords under direct vision, positive ETCO2, CO2 detector and breath sounds checked- equal and bilateral Secured at: 21 cm Tube secured with: Tape Dental Injury: Teeth and Oropharynx as per pre-operative assessment  Difficulty Due To: Difficulty was unanticipated, Difficult Airway- due to anterior larynx and Difficult Airway- due to reduced neck mobility Comments: Grade 3 view with DL and attempt unsuccessful. Grade 1 view with glidescope and successful intubation.

## 2021-05-20 NOTE — Progress Notes (Signed)
Assisted Dr. Adele Barthel with  Right interscalene brachial plexus block. Side rails up, monitors on throughout procedure. See vital signs in flow sheet. Tolerated Procedure well.

## 2021-05-20 NOTE — Anesthesia Procedure Notes (Signed)
Anesthesia Regional Block: Interscalene brachial plexus block   Pre-Anesthetic Checklist: , timeout performed,  Correct Patient, Correct Site, Correct Laterality,  Correct Procedure, Correct Position, site marked,  Risks and benefits discussed,  Surgical consent,  Pre-op evaluation,  At surgeon's request and post-op pain management  Laterality: Right  Prep: chloraprep       Needles:  Injection technique: Single-shot  Needle Type: Echogenic Stimulator Needle     Needle Length: 9cm  Needle Gauge: 21     Additional Needles:   Procedures:,,,, ultrasound used (permanent image in chart),,    Narrative:  Start time: 05/20/2021 8:40 AM End time: 05/20/2021 8:50 AM Injection made incrementally with aspirations every 5 mL.  Performed by: Personally  Anesthesiologist: Murvin Natal, MD  Additional Notes: Functioning IV was confirmed and monitors were applied.  A timeout was performed. Sterile prep, hand hygiene and sterile gloves were used. A 60mm 21ga Arrow echogenic stimulator needle was used. Negative aspiration and negative test dose prior to incremental administration of local anesthetic. The patient tolerated the procedure well.  Ultrasound guidance: relevent anatomy identified, needle position confirmed, local anesthetic spread visualized around nerve(s), vascular puncture avoided.  Image printed for medical record.

## 2021-05-21 ENCOUNTER — Encounter (HOSPITAL_COMMUNITY): Payer: Self-pay | Admitting: Orthopaedic Surgery

## 2021-06-10 LAB — AEROBIC/ANAEROBIC CULTURE W GRAM STAIN (SURGICAL/DEEP WOUND)
Culture: NO GROWTH
Culture: NO GROWTH
Culture: NO GROWTH
Gram Stain: NONE SEEN

## 2021-07-21 DIAGNOSIS — M5416 Radiculopathy, lumbar region: Secondary | ICD-10-CM | POA: Insufficient documentation

## 2021-08-04 ENCOUNTER — Telehealth: Payer: Self-pay | Admitting: Family Medicine

## 2021-08-04 NOTE — Telephone Encounter (Signed)
Left message for patient to schedule Annual Wellness Visit.  Please schedule (telephone/video call) with Nurse Health Advisor Tina Betterson, RN at [facility]. Please call [phone] ask for Kathy 

## 2021-08-18 DIAGNOSIS — M159 Polyosteoarthritis, unspecified: Secondary | ICD-10-CM | POA: Insufficient documentation

## 2021-08-19 ENCOUNTER — Ambulatory Visit: Payer: Medicare Other | Admitting: Family Medicine

## 2021-08-19 ENCOUNTER — Telehealth: Payer: Self-pay | Admitting: Family Medicine

## 2021-08-19 ENCOUNTER — Encounter: Payer: Self-pay | Admitting: Family Medicine

## 2021-08-19 VITALS — BP 138/80 | HR 75 | Temp 97.7°F | Ht 66.0 in | Wt 200.0 lb

## 2021-08-19 DIAGNOSIS — E039 Hypothyroidism, unspecified: Secondary | ICD-10-CM

## 2021-08-19 DIAGNOSIS — Z79899 Other long term (current) drug therapy: Secondary | ICD-10-CM

## 2021-08-19 DIAGNOSIS — E782 Mixed hyperlipidemia: Secondary | ICD-10-CM

## 2021-08-19 DIAGNOSIS — F411 Generalized anxiety disorder: Secondary | ICD-10-CM | POA: Diagnosis not present

## 2021-08-19 DIAGNOSIS — Z789 Other specified health status: Secondary | ICD-10-CM | POA: Diagnosis not present

## 2021-08-19 DIAGNOSIS — B001 Herpesviral vesicular dermatitis: Secondary | ICD-10-CM

## 2021-08-19 DIAGNOSIS — E669 Obesity, unspecified: Secondary | ICD-10-CM

## 2021-08-19 DIAGNOSIS — M5416 Radiculopathy, lumbar region: Secondary | ICD-10-CM

## 2021-08-19 DIAGNOSIS — F1310 Sedative, hypnotic or anxiolytic abuse, uncomplicated: Secondary | ICD-10-CM

## 2021-08-19 DIAGNOSIS — M4802 Spinal stenosis, cervical region: Secondary | ICD-10-CM

## 2021-08-19 DIAGNOSIS — R030 Elevated blood-pressure reading, without diagnosis of hypertension: Secondary | ICD-10-CM

## 2021-08-19 LAB — BASIC METABOLIC PANEL
BUN: 16 mg/dL (ref 6–23)
CO2: 26 mEq/L (ref 19–32)
Calcium: 9.5 mg/dL (ref 8.4–10.5)
Chloride: 104 mEq/L (ref 96–112)
Creatinine, Ser: 0.94 mg/dL (ref 0.40–1.20)
GFR: 59.53 mL/min — ABNORMAL LOW (ref >60.00)
Glucose, Bld: 86 mg/dL (ref 70–99)
Potassium: 4.1 mEq/L (ref 3.5–5.1)
Sodium: 139 mEq/L (ref 135–145)

## 2021-08-19 LAB — TSH: TSH: 1.41 u[IU]/mL (ref 0.35–5.50)

## 2021-08-19 MED ORDER — ARMOUR THYROID 60 MG PO TABS: ORAL_TABLET | ORAL | 3 refills | Status: DC

## 2021-08-19 MED ORDER — VALACYCLOVIR HCL 1 G PO TABS: ORAL_TABLET | ORAL | 5 refills | Status: DC

## 2021-08-19 MED ORDER — METHOCARBAMOL 500 MG PO TABS: 500.0000 mg | ORAL_TABLET | Freq: Every day | ORAL | 1 refills | Status: DC | PRN

## 2021-08-19 MED ORDER — ALPRAZOLAM 0.5 MG PO TABS: 0.5000 mg | ORAL_TABLET | Freq: Two times a day (BID) | ORAL | 1 refills | Status: DC

## 2021-08-19 NOTE — Progress Notes (Signed)
? ? ?Carol Norton , 07/26/1945, 76 y.o., female ?MRN: [id number] ?Patient Care Team  ?  Relationship Specialty Notifications Start End  ?[name], DO PCP - General Family Medicine  05/26/16   ?[name], MD Consulting Physician Orthopedic Surgery  03/11/16   ?[name], NP Nurse Practitioner Nurse Practitioner  05/26/16   ?[name], MD Consulting Physician Internal Medicine  05/26/16   ? Comment: endocrine, thyroid  ?Kathee Delton, MD Referring Physician Pulmonary Disease  05/26/16   ?[name], MD (Inactive) Consulting Physician Neurology  05/26/16   ?[name], MD Consulting Physician Cardiology  05/26/16   ?[name], MD Consulting Physician Ophthalmology  05/26/16   ? Comment: Dr Kathlen Mody at [facility] for glaucoma and cataracts every 3 mos  ?[name], MD Attending Physician Family Medicine  05/26/16   ? Comment: [facility]  ? ? ?Chief Complaint  ?Patient presents with  ? Anxiety  ?  [place]; pt is not fasting  ? ?  ?Subjective: Carol Norton is a 76 y.o. female present for follow-up on chronic conditions ?Anxiety:  ?Patient reports she is doing well on her regimen and reports compliance with Xanax 0.5 mg twice daily.  Brings in a list of blood pressure today that are consistently elevated majority in the 024O-973Z systolics.  She endorses blood pressure checks at home to make her nervous.Patient denies chest pain, shortness of breath, dizziness or lower extremity edema.  ?She has been unable to tolerate citalopram, Lexapro or Wellbutrin in the past.  She has many reactions to different types of medications.   ?She does have a controlled substance contract. Patient has been counseled on the addictive properties of benzodiazepines. She has been tried on other medications with side effects.  ? ?Hypothyroid: ?Patient reports compliance with Armour Thyroid 60 mg daily.  She denies any side effects today or worsening palpitations.  Labs are due today.  She was intolerant to  levothyroxine/Synthroid. ? ?Hyperlipidemia: Patient has had elevated cholesterol.  She is intolerant to statins.  Her LDL had increased upon review of the [place] labs from March 2022.  LDL now 153. ? ? ?  08/19/2021  ?  1:28 PM 03/09/2021  ?  9:35 AM 02/23/2021  ?  3:58 PM 11/27/2020  ?  3:31 PM 02/13/2019  ?  3:08 PM  ?Depression screen PHQ 2/9  ?Decreased Interest 0 0 0 0 1  ?Down, Depressed, Hopeless 0 0 0 0 1  ?PHQ - 2 Score 0 0 0 0 2  ?Altered sleeping 1 0   1  ?Tired, decreased energy 1 0   1  ?Change in appetite 0 0   1  ?Feeling bad or failure about yourself  0 0   0  ?Trouble concentrating 0 0   1  ?Moving slowly or fidgety/restless 0 0   0  ?Suicidal thoughts 0 0   0  ?PHQ-9 Score 2 0   6  ?Difficult doing work/chores     Somewhat difficult  ? ? ?  08/19/2021  ?  1:28 PM 03/09/2021  ?  9:38 AM 02/13/2019  ?  3:08 PM 10/04/2018  ? 10:06 AM  ?GAD 7 : Generalized Anxiety Score  ?Nervous, Anxious, on Edge '1 1 2 '$ 0  ?Control/stop worrying '1 1 2 '$ 0  ?Worry too much - different things '2 1 2 1  '$ ?Trouble relaxing '2 1 1 '$ 0  ?Restless 2 0 0 0  ?Easily annoyed or irritable 0 1 0  0  ?Afraid - awful might happen 0 '1 2 1  '$ ?Total GAD 7 Score '8 6 9 2  '$ ?Anxiety Difficulty   Somewhat difficult Not difficult at all  ? ?Allergies  ?Allergen Reactions  ? Synthroid [Levothyroxine Sodium] Shortness Of Breath  ? Oxycodone-Acetaminophen Itching  ? Accolate [Zafirlukast]   ?  Unknown reaction   ? Avelox [Moxifloxacin Hcl In Nacl]   ?  Increased pulse rate and bp  ? Citalopram   ?  Unknown reaction  ? Dexilant [Dexlansoprazole]   ?  Unknown reaction   ? Escitalopram Oxalate   ?  Couldn't function, lethargic   ? Meloxicam   ?  No nsaids  ? Nsaids   ?  Hair loss, couldn't empty bladder   ? Restasis [Cyclosporine]   ?  Nasal/throat issues   ? Singulair [Montelukast Sodium]   ?  Aggression,anger  ? Statins   ?  Myalgias, joint pain. Elevated bp and heart rate   ? Tramadol   ?  Burning of mouth  ? Wellbutrin [Bupropion] Hives  ? Dymista  [Azelastine-Fluticasone]   ? ?Social History  ? ?Tobacco Use  ? Smoking status: Former  ?  Packs/day: 0.75  ?  Years: 25.00  ?  Pack years: 18.75  ?  Types: Cigarettes  ?  Quit date: 05/25/2007  ?  Years since quitting: 14.2  ? Smokeless tobacco: Never  ? Tobacco comments:  ?  quit for 10 years in the total 12yr.   ?Substance Use Topics  ? Alcohol use: No  ?  Comment: rare  ? ?Past Medical History:  ?Diagnosis Date  ? Allergic rhinitis   ? Allergy   ? Anxiety   ? Arthritis   ? Bilateral carpal tunnel syndrome   ? Patient scheduled for surgery with neurology  ? Cancer ([facility]   ? skin cancer  ? Cataract   ? Chronic constipation   ? Chronic sinusitis   ? CN (constipation) 12/17/2014  ? Complication of anesthesia   ? with nerve block had hard time taking a breath for a day after surgery wtih ketamine slow to wake up  ? COVID-19 virus infection 05/2019  ? GERD (gastroesophageal reflux disease)   ? Glaucoma   ? Glaucoma   ? Hyperlipemia   ? Hypothyroid   ? Leg length discrepancy   ? Ocular rosacea   ? Plantar fasciitis   ? Sebaceous cyst   ? Sleep apnea   ? on CPAP  ? Tear of lateral meniscus of knee 02/17/2019  ? Urethral stenosis   ? Vitamin D deficiency   ? ?Past Surgical History:  ?Procedure Laterality Date  ? ABDOMINAL HYSTERECTOMY  1983  ? BREAST SURGERY    ? sebaceous cyst  ? DENTAL SURGERY  11/10/2020  ? EYE SURGERY    ? surgery with laser  ? FACIAL COSMETIC SURGERY    ? 1995  ? GANGLION CYST EXCISION    ? MENISCUS REPAIR Left   ? REVERSE SHOULDER ARTHROPLASTY Right 05/20/2021  ? Procedure: REVERSE SHOULDER ARTHROPLASTY;  Surgeon: VHiram Gash MD;  Location: [facility];  Service: Orthopedics;  Laterality: Right;  ? RHINOPLASTY    ? right carpal tunnel     ? right rotator cuff surgery     ? ROTATOR CUFF REPAIR Right 01/06/2021  ? SEPTOPLASTY    ? SYNOVECTOMY Right 05/20/2021  ? Procedure: SYNOVECTOMY;  Surgeon: VHiram Gash MD;  Location: [facility];  Service: Orthopedics;  Laterality: Right;  ?  TUBAL LIGATION     ? ?Family History  ?Problem Relation Age of Onset  ? Emphysema Father   ? Alcohol abuse Father   ? Allergies Mother   ? Heart disease Mother   ? Ovarian cancer Mother   ? Hyperlipidemia Mother   ? Hypothyroidism Mother   ? Arthritis Mother   ? Hypothyroidism Sister   ? Lung cancer Paternal Aunt   ? Diabetes Maternal Grandmother   ? Heart disease Maternal Grandmother   ? Stroke Paternal Grandfather   ? ?Allergies as of 08/19/2021   ? ?   Reactions  ? Synthroid [levothyroxine Sodium] Shortness Of Breath  ? Oxycodone-acetaminophen Itching  ? Accolate [zafirlukast]   ? Unknown reaction   ? Avelox [moxifloxacin Hcl In Nacl]   ? Increased pulse rate and bp  ? Citalopram   ? Unknown reaction  ? Dexilant [dexlansoprazole]   ? Unknown reaction   ? Escitalopram Oxalate   ? Couldn't function, lethargic   ? Meloxicam   ? No nsaids  ? Nsaids   ? Hair loss, couldn't empty bladder   ? Restasis [cyclosporine]   ? Nasal/throat issues   ? Singulair [montelukast Sodium]   ? Aggression,anger  ? Statins   ? Myalgias, joint pain. Elevated bp and heart rate   ? Tramadol   ? Burning of mouth  ? Wellbutrin [bupropion] Hives  ? Dymista [azelastine-fluticasone]   ? ?  ? ?  ?Medication List  ?  ? ?  ? Accurate as of August 19, 2021 11:59 PM. If you have any questions, ask your nurse or doctor.  ?  ?  ? ?  ? ?STOP taking these medications   ? ?diphenhydrAMINE 25 MG tablet ?Commonly known as: BENADRYL ?Stopped by: Howard Pouch, DO ?  ?guaiFENesin 600 MG 12 hr tablet ?Commonly known as: [place] ?Stopped by: Howard Pouch, DO ?  ?HYDROmorphone 2 MG tablet ?Commonly known as: Dilaudid ?Stopped by: Howard Pouch, DO ?  ?naproxen sodium 220 MG tablet ?Commonly known as: ALEVE ?Stopped by: Howard Pouch, DO ?  ? ?  ? ?TAKE these medications   ? ?ALPRAZolam 0.5 MG tablet ?Commonly known as: Duanne Moron ?Take 1 tablet (0.5 mg total) by mouth 2 (two) times daily. ?What changed:  ?when to take this ?reasons to take this ?  ?Armour Thyroid 60 MG tablet ?Generic  drug: thyroid ?Take one tablet daily ?  ?calcium carbonate 500 MG chewable tablet ?Commonly known as: TUMS - dosed in mg elemental calcium ?Chew 1 tablet by mouth daily as needed for indigestion or heartburn. ?  ?diclofenac sodium 1 % G

## 2021-08-19 NOTE — Patient Instructions (Addendum)
? ? ?  Next appt in 5.5 months.  ? ?We will call you with lab results  ?

## 2021-08-19 NOTE — Telephone Encounter (Signed)
Please inform patient ?Her kidney function is stable ?Her thyroid level is normal.  I have refilled her thyroid medication for her. ?

## 2021-08-20 ENCOUNTER — Encounter: Payer: Self-pay | Admitting: Family Medicine

## 2021-08-20 DIAGNOSIS — B001 Herpesviral vesicular dermatitis: Secondary | ICD-10-CM | POA: Insufficient documentation

## 2021-08-20 DIAGNOSIS — R030 Elevated blood-pressure reading, without diagnosis of hypertension: Secondary | ICD-10-CM | POA: Insufficient documentation

## 2021-08-20 NOTE — Telephone Encounter (Signed)
Spoke with pt regarding labs and instructions.   

## 2021-08-25 ENCOUNTER — Ambulatory Visit: Payer: Medicare Other | Admitting: Allergy and Immunology

## 2021-08-27 ENCOUNTER — Ambulatory Visit (INDEPENDENT_AMBULATORY_CARE_PROVIDER_SITE_OTHER): Payer: Medicare Other

## 2021-08-27 DIAGNOSIS — Z1211 Encounter for screening for malignant neoplasm of colon: Secondary | ICD-10-CM

## 2021-08-27 DIAGNOSIS — Z0000 Encounter for general adult medical examination without abnormal findings: Secondary | ICD-10-CM | POA: Diagnosis not present

## 2021-08-27 NOTE — Progress Notes (Addendum)
? ?Subjective:  ? Carol Norton is a 76 y.o. female who presents for Medicare Annual (Subsequent) preventive examination. I connected with  Carol Norton on 08/27/21 by a audio enabled telemedicine application and verified that I am speaking with the correct person using two identifiers. ? ?Patient Location: Home ? ?Provider Location: Home Office ? ?I discussed the limitations of evaluation and management by telemedicine. The patient expressed understanding and agreed to proceed.  ? ?Review of Systems    ?Defer to PCP ?Cardiac Risk Factors include: advanced age (>42mn, >>75women);dyslipidemia;obesity (BMI >30kg/m2) ? ?   ?Objective:  ?  ?There were no vitals filed for this visit. ?There is no height or weight on file to calculate BMI. ? ? ?  08/27/2021  ?  3:43 PM 05/12/2021  ?  8:08 AM 10/04/2018  ? 11:58 AM 10/11/2017  ?  1:48 PM 03/25/2015  ?  1:37 PM  ?Advanced Directives  ?Does Patient Have a Medical Advance Directive? No No Yes No Yes  ?Type of [place]Production managerof [place] will  ?Would patient like information on creating a medical advance directive? No - Patient declined      ? ? ?Current Medications (verified) ?Outpatient Encounter Medications as of 08/27/2021  ?Medication Sig  ? ALPRAZolam (XANAX) 0.5 MG tablet Take 1 tablet (0.5 mg total) by mouth 2 (two) times daily.  ? ARMOUR THYROID 60 MG tablet Take one tablet daily  ? Bioflavonoid Products (VITAMIN C) CHEW Chew 2-4 tablets by mouth daily as needed (immune support).  ? calcium carbonate (TUMS - DOSED IN MG ELEMENTAL CALCIUM) 500 MG chewable tablet Chew 1 tablet by mouth daily as needed for indigestion or heartburn.  ? Cholecalciferol (VITAMIN D) 125 MCG (5000 UT) CAPS Take 10,000 capsules by mouth daily.  ? diclofenac sodium (VOLTAREN) 1 % GEL Apply 1 application topically 4 (four) times daily as needed (pain).  ? Magnesium 400 MG TABS Take 400 mg by mouth daily.  ? methocarbamol (ROBAXIN) 500 MG tablet Take 1 tablet (500 mg total)  by mouth daily as needed for muscle spasms.  ? Multiple Vitamins-Minerals (ICAPS AREDS 2 PO) Take 1 capsule by mouth 2 (two) times daily.  ? NON FORMULARY Apply 1 application topically 4 (four) times daily as needed. KETAMINE HCL/BACLOFEN/CYCLOBENZAPRINE HCL/DICLOFENAC SOD/GABAPENTIN/LIDOCAINE HCL (TOPI)  ? Olopatadine HCl 0.6 % SOLN Place 2 sprays into both nostrils 2 (two) times daily.  ? polyethylene glycol (MIRALAX / GLYCOLAX) 17 g packet Take 17 g by mouth at bedtime.  ? PRESCRIPTION MEDICATION Place 1 drop into both eyes 3 (three) times daily as needed (dry eyes). Vital Tears compounded 30% autologous serum eye drops  ? sodium chloride (OCEAN) 0.65 % SOLN nasal spray Place 1 spray into both nostrils as needed for congestion.  ? valACYclovir (VALTREX) 1000 MG tablet 2 tabs at onset of cold sore and repeat dose once 12 hours later.  ? ?No facility-administered encounter medications on file as of 08/27/2021.  ? ? ?Allergies (verified) ?Synthroid [levothyroxine sodium], Oxycodone-acetaminophen, Accolate [zafirlukast], Avelox [moxifloxacin hcl in nacl], Citalopram, Dexilant [dexlansoprazole], Escitalopram oxalate, Meloxicam, Nsaids, Restasis [cyclosporine], Singulair [montelukast sodium], Statins, Tramadol, Wellbutrin [bupropion], and Dymista [azelastine-fluticasone]  ? ?History: ?Past Medical History:  ?Diagnosis Date  ? Allergic rhinitis   ? Allergy   ? Anxiety   ? Arthritis   ? Bilateral carpal tunnel syndrome   ? Patient scheduled for surgery with neurology  ? Cancer ([facility]   ? skin cancer  ?  Cataract   ? Chronic constipation   ? Chronic sinusitis   ? CN (constipation) 12/17/2014  ? Complication of anesthesia   ? with nerve block had hard time taking a breath for a day after surgery wtih ketamine slow to wake up  ? COVID-19 virus infection 05/2019  ? GERD (gastroesophageal reflux disease)   ? Glaucoma   ? Glaucoma   ? Hyperlipemia   ? Hypothyroid   ? Leg length discrepancy   ? Ocular rosacea   ? Plantar fasciitis    ? Sebaceous cyst   ? Sleep apnea   ? on CPAP  ? Tear of lateral meniscus of knee 02/17/2019  ? Urethral stenosis   ? Vitamin D deficiency   ? ?Past Surgical History:  ?Procedure Laterality Date  ? ABDOMINAL HYSTERECTOMY  1983  ? BREAST SURGERY    ? sebaceous cyst  ? DENTAL SURGERY  11/10/2020  ? EYE SURGERY    ? surgery with laser  ? FACIAL COSMETIC SURGERY    ? 1995  ? GANGLION CYST EXCISION    ? MENISCUS REPAIR Left   ? REVERSE SHOULDER ARTHROPLASTY Right 05/20/2021  ? Procedure: REVERSE SHOULDER ARTHROPLASTY;  Surgeon: Hiram Gash, MD;  [facility];  Service: Orthopedics;  Laterality: Right;  ? RHINOPLASTY    ? right carpal tunnel     ? right rotator cuff surgery     ? ROTATOR CUFF REPAIR Right 01/06/2021  ? SEPTOPLASTY    ? SYNOVECTOMY Right 05/20/2021  ? Procedure: SYNOVECTOMY;  Surgeon: Hiram Gash, MD;  [facility];  Service: Orthopedics;  Laterality: Right;  ? TUBAL LIGATION    ? ?Family History  ?Problem Relation Age of Onset  ? Emphysema Father   ? Alcohol abuse Father   ? Allergies Mother   ? Heart disease Mother   ? Ovarian cancer Mother   ? Hyperlipidemia Mother   ? Hypothyroidism Mother   ? Arthritis Mother   ? Hypothyroidism Sister   ? Lung cancer Paternal Aunt   ? Diabetes Maternal Grandmother   ? Heart disease Maternal Grandmother   ? Stroke Paternal Grandfather   ? ?Social History  ? ?Socioeconomic History  ? Marital status: Married  ?  Spouse name: Carol Norton  ? Number of children: 2  ? Years of education: 27  ? Highest education level: 12th grade  ?Occupational History  ? Occupation: retired  ?Tobacco Use  ? Smoking status: Former  ?  Packs/day: 0.75  ?  Years: 25.00  ?  Pack years: 18.75  ?  Types: Cigarettes  ?  Quit date: 05/25/2007  ?  Years since quitting: 14.2  ? Smokeless tobacco: Never  ? Tobacco comments:  ?  quit for 10 years in the total 19yr.   ?Vaping Use  ? Vaping Use: Never used  ?Substance and Sexual Activity  ? Alcohol use: No  ?  Comment: rare  ? Drug use: No  ? Sexual  activity: Yes  ?  Partners: Male  ?  Birth control/protection: Surgical  ?  Comment: Married  ?Other Topics Concern  ? Not on file  ?Social History Narrative  ? Married to [place] 2 children.  ? Dinks caffeine.  ? Patient is right handed.   ? Exercises routinely.  ? Wears seatbelt, smoke detector at home.  ? Feels safe in relationship.  ? ?Social Determinants of Health  ? ?Financial Resource Strain: Low Risk   ? Difficulty of Paying Living Expenses: Not hard at  all  ?Food Insecurity: No Food Insecurity  ? Worried About Charity fundraiser in the Last Year: Never true  ? Ran Out of Food in the Last Year: Never true  ?Transportation Needs: No Transportation Needs  ? Lack of Transportation (Medical): No  ? Lack of Transportation (Non-Medical): No  ?Physical Activity: Inactive  ? Days of Exercise per Week: 0 days  ? Minutes of Exercise per Session: 0 min  ?Stress: No Stress Concern Present  ? Feeling of Stress : Not at all  ?Social Connections: Moderately Isolated  ? Frequency of Communication with Friends and Family: More than three times a week  ? Frequency of Social Gatherings with Friends and Family: More than three times a week  ? Attends Religious Services: Never  ? Active Member of Clubs or Organizations: No  ? Attends Archivist Meetings: Never  ? Marital Status: Married  ? ? ?Tobacco Counseling ?Counseling given: Not Answered ?Tobacco comments: quit for 10 years in the total 33yr.  ? ? ?Clinical Intake: ? ?Pre-visit preparation completed: Yes ? ?Pain : No/denies pain ? ?  ? ?BMI - recorded: 32 ?Nutritional Status: BMI > 30  Obese ?Nutritional Risks: None ?Diabetes: No ? ?How often do you need to have someone help you when you read instructions, pamphlets, or other written materials from your doctor or pharmacy?: 1 - Never ?What is the last grade level you completed in school?: college ? ?Diabetic?No ? ?Interpreter Needed?: No ? ?Information entered by :: Toria C., RMA ? ? ?Activities of Daily  Living ? ?  08/27/2021  ?  3:39 PM 05/12/2021  ?  8:10 AM  ?In your present state of health, do you have any difficulty performing the following activities:  ?Hearing? 0   ?Vision? 0   ?Difficulty concentra

## 2021-08-27 NOTE — Patient Instructions (Signed)
?  Ms. Carol Norton , ?Thank you for taking time to come for your Medicare Wellness Visit. I appreciate your ongoing commitment to your health goals. Please review the following plan we discussed and let me know if I can assist you in the future.  ? ?These are the goals we discussed: ? Goals   ? ?  Exercise 150 min/wk Moderate Activity   ?  Maintain Mobility and Function   ?  Evidence-based guidance:  ?Emphasize the importance of physical activity and aerobic exercise as included in treatment plan; assess barriers to adherence; consider patient's abilities and preferences.  ?Encourage gradual increase in activity or exercise instead of stopping if pain occurs.  ?Reinforce individual therapy exercise prescription, such as strengthening, stabilization and stretching programs.  ?Promote optimal body mechanics to stabilize the spine with lifting and functional activity.  ?Encourage activity and mobility modifications to facilitate optimal function, such as using a log roll for bed mobility or dressing from a seated position.  ?Reinforce individual adaptive equipment recommendations to limit excessive spinal movements, such as a Systems analyst.  ?Assess adequacy of sleep; encourage use of sleep hygiene techniques, such as bedtime routine; use of white noise; dark, cool bedroom; avoiding daytime naps, heavy meals or exercise before bedtime.  ?Promote positions and modification to optimize sleep and sexual activity; consider pillows or positioning devices to assist in maintaining neutral spine.  ?Explore options for applying ergonomic principles at work and home, such as frequent position changes, using ergonomically designed equipment and working at optimal height.  ?Promote modifications to increase comfort with driving such as lumbar support, optimizing seat and steering wheel position, using cruise control and taking frequent rest stops to stretch and walk.   ?Notes:  ?  ? ?  ?  ?This is a list of the screening  recommended for you and due dates:  ?Health Maintenance  ?Topic Date Due  ? Cologuard (Stool DNA test)  Never done  ? COVID-19 Vaccine (1) 09/04/2021*  ? Flu Shot  12/22/2021  ? Tetanus Vaccine  09/06/2028  ? Pneumonia Vaccine  Completed  ? Hepatitis C Screening: USPSTF Recommendation to screen - Ages 73-79 yo.  Completed  ? Zoster (Shingles) Vaccine  Completed  ? HPV Vaccine  Aged Out  ? DEXA scan (bone density measurement)  Discontinued  ? Colon Cancer Screening  Discontinued  ?*Topic was postponed. The date shown is not the original due date.  ?  ?

## 2021-09-17 LAB — COLOGUARD: COLOGUARD: NEGATIVE

## 2022-01-09 IMAGING — CT CT SHOULDER*R* W/O CM
1 of 2 series · 9 of 14 positions shown, 12 images · non-contrast
Comparison: None.

CLINICAL DATA: Right shoulder pain.  Preoperative exam

EXAM:
CT OF THE UPPER RIGHT EXTREMITY WITHOUT CONTRAST
TECHNIQUE: Multidetector CT imaging of the upper right extremity was performed
according to the standard protocol.

[Series 5: thin soft · axial · 0.60mm/px · z∈[-588,-433]mm · 9 of 325 slices shown, 12 images]
[im 33/325  soft-tissue]
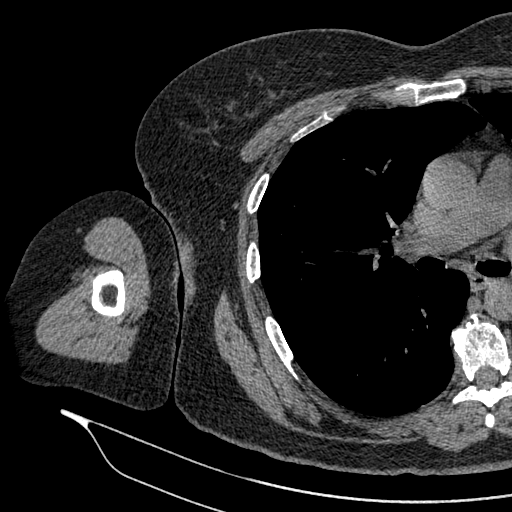
[im 33/325  bone]
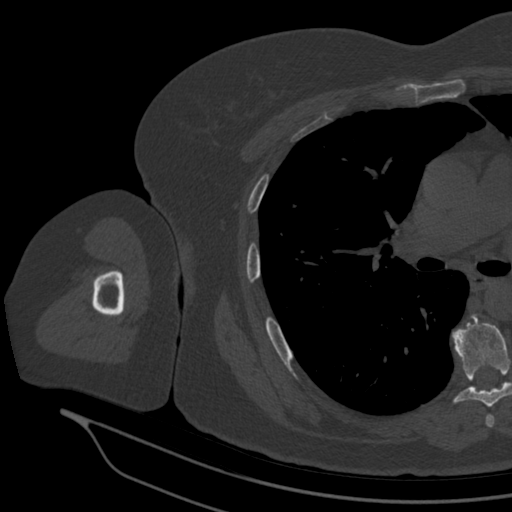
[im 65/325  bone]
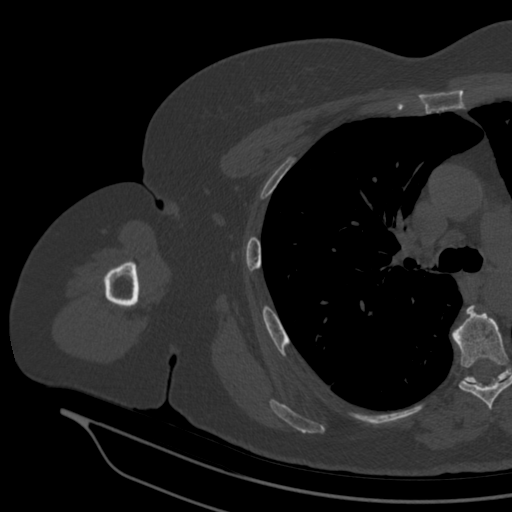
[im 98/325  bone]
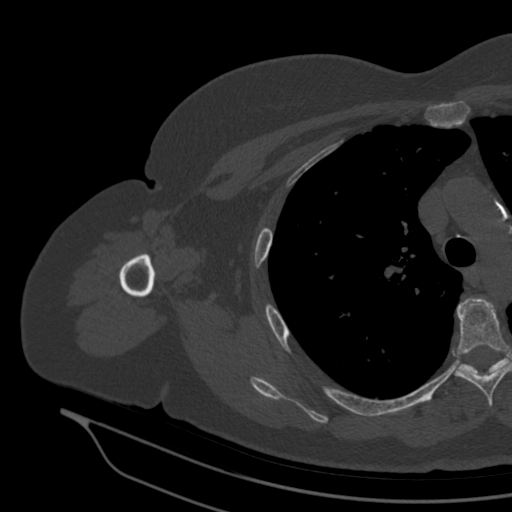
[im 130/325  bone]
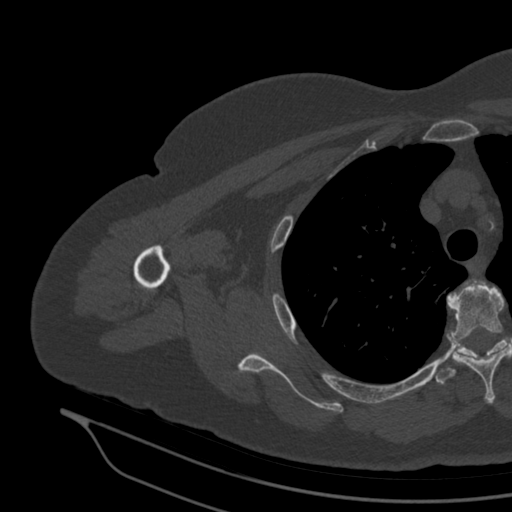
[im 163/325  soft-tissue]
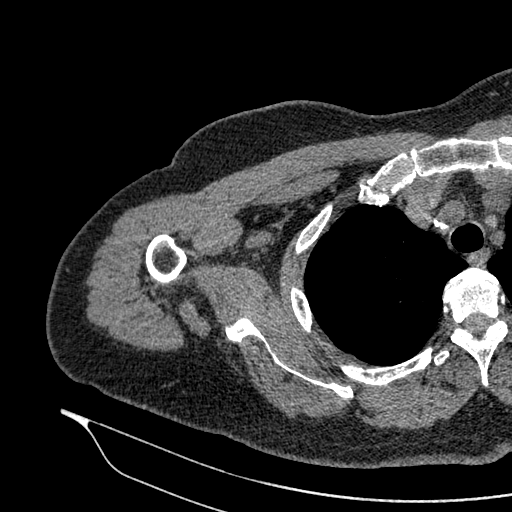
[im 163/325  bone]
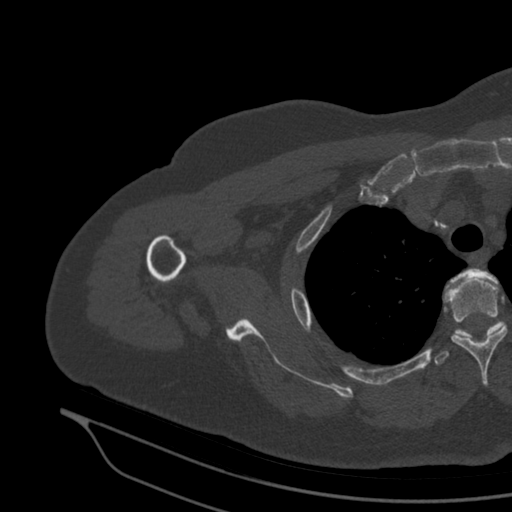
[im 195/325  bone]
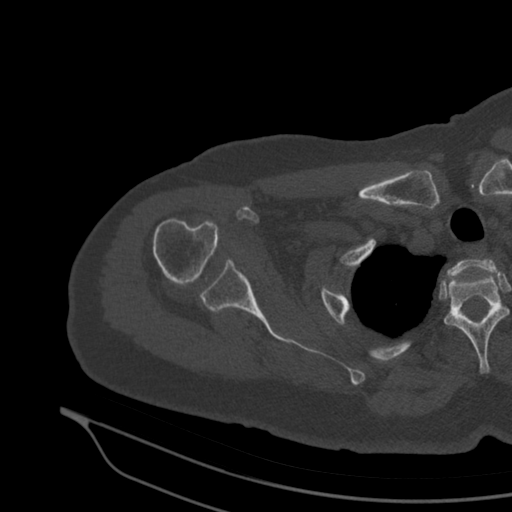
[im 227/325  bone]
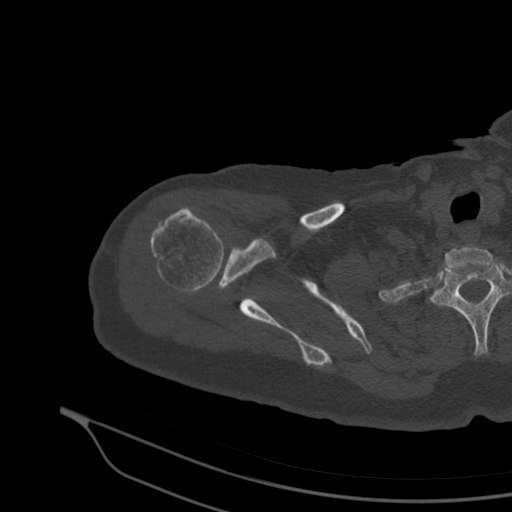
[im 260/325  bone]
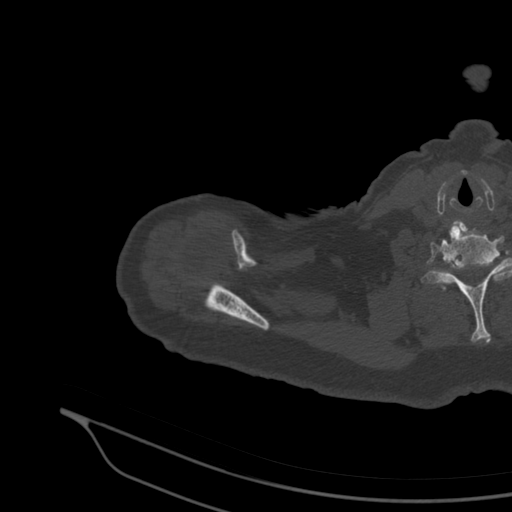
[im 292/325  soft-tissue]
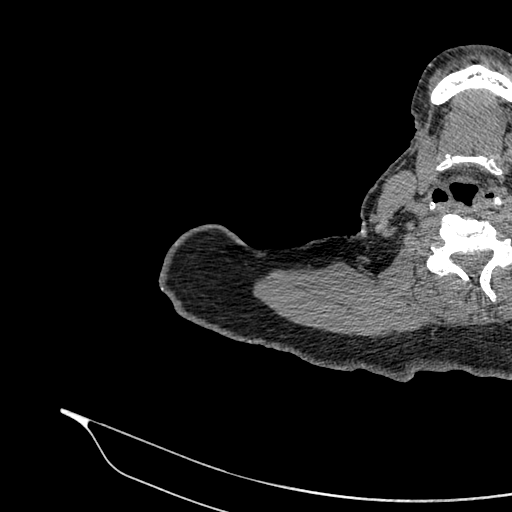
[im 292/325  bone]
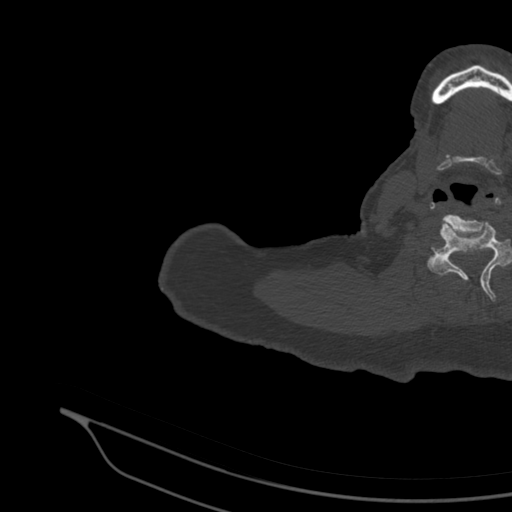

[9 of 14 positions shown; findings below may reference images not displayed]

FINDINGS: Bones/Joint/Cartilage

No acute fracture. No dislocation. Glenohumeral joint osteoarthritis
manifested by joint space narrowing and small marginal osteophyte
formation. No significant subchondral cystic changes within the
glenoid. Humeral head is high-riding relative to the glenoid. A
small glenohumeral joint effusion is evident.

Mild degenerative changes of the acromioclavicular joint. A small
amount of fluid is seen within the subacromial-subdeltoid bursal
space. Remaining visualized osseous structures appear grossly
intact.

Ligaments

Suboptimally assessed by CT.

Muscles and Tendons

Preserved muscle bulk of the rotator cuff musculature without
significant atrophy or fatty infiltration. Rotator cuff tendons
suboptimally assessed by CT.

Soft tissues

No soft tissue fluid collection or hematoma. No axillary
lymphadenopathy. Visualized lung field is clear. Aortic
atherosclerosis.
IMPRESSION: 1. Glenohumeral joint osteoarthritis with a small glenohumeral joint
effusion.
2. Small amount of fluid within the subacromial-subdeltoid bursal
space.

Aortic Atherosclerosis ([id number]-90Z.Z).

## 2022-01-21 ENCOUNTER — Encounter: Payer: Self-pay | Admitting: Family Medicine

## 2022-01-21 ENCOUNTER — Ambulatory Visit: Payer: Medicare Other | Admitting: Family Medicine

## 2022-01-21 VITALS — BP 124/72 | HR 73 | Temp 97.6°F | Ht 66.0 in | Wt 202.0 lb

## 2022-01-21 DIAGNOSIS — E782 Mixed hyperlipidemia: Secondary | ICD-10-CM

## 2022-01-21 DIAGNOSIS — E669 Obesity, unspecified: Secondary | ICD-10-CM | POA: Diagnosis not present

## 2022-01-21 DIAGNOSIS — E559 Vitamin D deficiency, unspecified: Secondary | ICD-10-CM

## 2022-01-21 DIAGNOSIS — E039 Hypothyroidism, unspecified: Secondary | ICD-10-CM

## 2022-01-21 DIAGNOSIS — Z789 Other specified health status: Secondary | ICD-10-CM

## 2022-01-21 DIAGNOSIS — F411 Generalized anxiety disorder: Secondary | ICD-10-CM

## 2022-01-21 DIAGNOSIS — F1310 Sedative, hypnotic or anxiolytic abuse, uncomplicated: Secondary | ICD-10-CM | POA: Diagnosis not present

## 2022-01-21 MED ORDER — METHOCARBAMOL 500 MG PO TABS: 500.0000 mg | ORAL_TABLET | Freq: Every day | ORAL | 1 refills | Status: DC | PRN

## 2022-01-21 MED ORDER — ARMOUR THYROID 60 MG PO TABS: ORAL_TABLET | ORAL | 3 refills | Status: DC

## 2022-01-21 MED ORDER — ALPRAZOLAM 0.5 MG PO TABS: 0.5000 mg | ORAL_TABLET | Freq: Two times a day (BID) | ORAL | 1 refills | Status: DC

## 2022-01-21 MED ORDER — VALACYCLOVIR HCL 1 G PO TABS: ORAL_TABLET | ORAL | 5 refills | Status: DC

## 2022-01-21 NOTE — Patient Instructions (Addendum)
Return in about 6 months (around 08/04/2022) for [place].        Great to see you today.  I have refilled the medication(s) we provide.   If labs were collected, we will inform you of lab results once received either by echart message or telephone call.   - echart message- for normal results that have been seen by the patient already.   - telephone call: abnormal results or if patient has not viewed results in their echart.

## 2022-01-21 NOTE — Progress Notes (Signed)
[name] , 1946/03/05, 76 y.o., female MRN: [id number] Patient Care Team    Relationship Specialty Notifications Start End  [name], DO PCP - General Family Medicine  05/26/16   [name], MD Consulting Physician Orthopedic Surgery  03/11/16   [name], NP Nurse Practitioner Nurse Practitioner  05/26/16   [name], MD Consulting Physician Internal Medicine  05/26/16    Comment: endocrine, thyroid  [name], [name], MD Referring Physician Pulmonary Disease  05/26/16   [name], MD (Inactive) Consulting Physician Neurology  05/26/16   [name], MD Consulting Physician Cardiology  05/26/16   [name], MD Consulting Physician Ophthalmology  05/26/16    Comment: Dr Kathlen Mody at [facility] for glaucoma and cataracts every 3 mos  [name], MD Attending Physician Family Medicine  05/26/16    Comment: [facility]    Chief Complaint  Patient presents with   Anxiety    [facility]; pt is fasting; has PCP in [place]     Subjective: Carol Norton is a 76 y.o. female present for follow-up on chronic conditions Anxiety:  Patient reports she is doing well on her regimen and reports compliance with Xanax 0.5 mg twice daily.  Brings in a list of blood pressure today that are consistently elevated majority in the 710G-269S systolics.  She endorses blood pressure checks at home to make her nervous. Patient denies chest pain, shortness of breath, dizziness or lower extremity edema.  She has been unable to tolerate citalopram, Lexapro or Wellbutrin in the past.  She has many reactions to different types of medications.   She does have a controlled substance contract. Patient has been counseled on the addictive properties of benzodiazepines. She has been tried on other medications with side effects.   Hypothyroid: Patient reports compliance with Armour Thyroid 60 mg daily.  She denies any side effects today or worsening palpitations.  Labs are UTD.  She was intolerant to  levothyroxine/Synthroid.   HTN/Hyperlipidemia: Patient has had elevated cholesterol.  She is intolerant to statins.      01/21/2022    8:26 AM 08/27/2021    3:43 PM 08/19/2021    1:28 PM 03/09/2021    9:35 AM 02/23/2021    3:58 PM  Depression screen PHQ 2/9  Decreased Interest 0 0 0 0 0  Down, Depressed, Hopeless 0 0 0 0 0  PHQ - 2 Score 0 0 0 0 0  Altered sleeping 0  1 0   Tired, decreased energy 0  1 0   Change in appetite 0  0 0   Feeling bad or failure about yourself  0  0 0   Trouble concentrating 0  0 0   Moving slowly or fidgety/restless 0  0 0   Suicidal thoughts 0  0 0   PHQ-9 Score 0  2 0       01/21/2022    8:26 AM 08/19/2021    1:28 PM 03/09/2021    9:38 AM 02/13/2019    3:08 PM  GAD 7 : Generalized Anxiety Score  Nervous, Anxious, on Edge '1 1 1 2  '$ Control/stop worrying '1 1 1 2  '$ Worry too much - different things '1 2 1 2  '$ Trouble relaxing '1 2 1 1  '$ Restless 1 2 0 0  Easily annoyed or irritable 0 0 1 0  Afraid - awful might happen 1 0 1 2  Total GAD 7 Score '6 8 6 9  '$ Anxiety  Difficulty    Somewhat difficult   Allergies  Allergen Reactions   Synthroid [Levothyroxine Sodium] Shortness Of Breath   Oxycodone-Acetaminophen Itching   Accolate [Zafirlukast]     Unknown reaction    Avelox [Moxifloxacin Hcl In Nacl]     Increased pulse rate and bp   Citalopram     Unknown reaction   Dexilant [Dexlansoprazole]     Unknown reaction    Escitalopram Oxalate     Couldn't function, lethargic    Meloxicam     No nsaids   Nsaids     Hair loss, couldn't empty bladder    Restasis [Cyclosporine]     Nasal/throat issues    Singulair [Montelukast Sodium]     Aggression,anger   Statins     Myalgias, joint pain. Elevated bp and heart rate    Tramadol     Burning of mouth   Wellbutrin [Bupropion] Hives   Dymista [Azelastine-Fluticasone]    Social History   Tobacco Use   Smoking status: Former    Packs/day: 0.75    Years: 25.00    Total pack years: 18.75    Types:  Cigarettes    Quit date: 05/25/2007    Years since quitting: 14.6   Smokeless tobacco: Never   Tobacco comments:    quit for 10 years in the total 35yr.   Substance Use Topics   Alcohol use: No    Comment: rare   Past Medical History:  Diagnosis Date   Allergic rhinitis    Allergy    Anxiety    Arthritis    Bilateral carpal tunnel syndrome    Patient scheduled for surgery with neurology   Cancer ([facility]    skin cancer   Cataract    Chronic constipation    Chronic sinusitis    CN (constipation) 069/62/9528  Complication of anesthesia    with nerve block had hard time taking a breath for a day after surgery wtih ketamine slow to wake up   COVID-19 virus infection 05/2019   GERD (gastroesophageal reflux disease)    Glaucoma    Glaucoma    Hyperlipemia    Hypothyroid    Leg length discrepancy    Ocular rosacea    Plantar fasciitis    Sebaceous cyst    Sleep apnea    on CPAP   Tear of lateral meniscus of knee 02/17/2019   Urethral stenosis    Vitamin D deficiency    Past Surgical History:  Procedure Laterality Date   ABDOMINAL HYSTERECTOMY  1983   BREAST SURGERY     sebaceous cyst   DENTAL SURGERY  11/10/2020   EYE SURGERY     surgery with laser   FACIAL COSMETIC SURGERY     1995   GANGLION CYST EXCISION     MENISCUS REPAIR Left    REVERSE SHOULDER ARTHROPLASTY Right 05/20/2021   Procedure: REVERSE SHOULDER ARTHROPLASTY;  Surgeon: VHiram Gash MD;  Location: [facility];  Service: Orthopedics;  Laterality: Right;   RHINOPLASTY     right carpal tunnel      right rotator cuff surgery      ROTATOR CUFF REPAIR Right 01/06/2021   SEPTOPLASTY     SYNOVECTOMY Right 05/20/2021   Procedure: SYNOVECTOMY;  Surgeon: VHiram Gash MD;  Location: [facility];  Service: Orthopedics;  Laterality: Right;   TUBAL LIGATION     Family History  Problem Relation Age of Onset   Emphysema Father    Alcohol abuse Father  Allergies Mother    Heart disease Mother    Ovarian cancer  Mother    Hyperlipidemia Mother    Hypothyroidism Mother    Arthritis Mother    Hypothyroidism Sister    Lung cancer Paternal Aunt    Diabetes Maternal Grandmother    Heart disease Maternal Grandmother    Stroke Paternal Grandfather    Allergies as of 01/21/2022       Reactions   Synthroid [levothyroxine Sodium] Shortness Of Breath   Oxycodone-acetaminophen Itching   Accolate [zafirlukast]    Unknown reaction    Avelox [moxifloxacin Hcl In Nacl]    Increased pulse rate and bp   Citalopram    Unknown reaction   Dexilant [dexlansoprazole]    Unknown reaction    Escitalopram Oxalate    Couldn't function, lethargic    Meloxicam    No nsaids   Nsaids    Hair loss, couldn't empty bladder    Restasis [cyclosporine]    Nasal/throat issues    Singulair [montelukast Sodium]    Aggression,anger   Statins    Myalgias, joint pain. Elevated bp and heart rate    Tramadol    Burning of mouth   Wellbutrin [bupropion] Hives   Dymista [azelastine-fluticasone]         Medication List        Accurate as of January 21, 2022  9:48 AM. If you have any questions, ask your nurse or doctor.          STOP taking these medications    calcium carbonate 500 MG chewable tablet Commonly known as: TUMS - dosed in mg elemental calcium Stopped by: Howard Pouch, DO   diclofenac sodium 1 % Gel Commonly known as: VOLTAREN Stopped by: Howard Pouch, DO   ICAPS AREDS 2 PO Stopped by: Howard Pouch, DO   Magnesium 400 MG Tabs Stopped by: Howard Pouch, DO   Vitamin C Chew Stopped by: Howard Pouch, DO   Vitamin D 125 MCG (5000 UT) Caps Stopped by: Howard Pouch, DO       TAKE these medications    ALPRAZolam 0.5 MG tablet Commonly known as: XANAX Take 1 tablet (0.5 mg total) by mouth 2 (two) times daily.   amLODipine 2.5 MG tablet Commonly known as: NORVASC Take 2.5 mg by mouth in the morning and at bedtime.   Armour Thyroid 60 MG tablet Generic drug: thyroid Take one tablet  daily   methocarbamol 500 MG tablet Commonly known as: ROBAXIN Take 1 tablet (500 mg total) by mouth daily as needed for muscle spasms.   NON FORMULARY Apply 1 application topically 4 (four) times daily as needed. KETAMINE HCL/BACLOFEN/CYCLOBENZAPRINE HCL/DICLOFENAC SOD/GABAPENTIN/LIDOCAINE HCL (TOPI)   Olopatadine HCl 0.6 % Soln Place 2 sprays into both nostrils 2 (two) times daily.   polyethylene glycol 17 g packet Commonly known as: MIRALAX / GLYCOLAX Take 17 g by mouth at bedtime.   PRESCRIPTION MEDICATION Place 1 drop into both eyes 3 (three) times daily as needed (dry eyes). Vital Tears compounded 30% autologous serum eye drops   sodium chloride 0.65 % Soln nasal spray Commonly known as: OCEAN Place 1 spray into both nostrils as needed for congestion.   valACYclovir 1000 MG tablet Commonly known as: VALTREX 2 tabs at onset of cold sore and repeat dose once 12 hours later.        All past medical history, surgical history, allergies, family history, immunizations andmedications were updated in the EMR today and reviewed under the history and  medication portions of their EMR.     ROS: Negative, with the exception of above mentioned in HPI   Objective:  BP 124/72   Pulse 73   Temp 97.6 F (36.4 C) (Oral)   Ht '5\' 6"'$  (1.676 m)   Wt 202 lb (91.6 kg)   SpO2 98%   BMI 32.60 kg/m  Body mass index is 32.6 kg/m. Physical Exam Vitals and nursing note reviewed.  Constitutional:      General: She is not in acute distress.    Appearance: Normal appearance. She is not ill-appearing, toxic-appearing or diaphoretic.  HENT:     Head: Normocephalic and atraumatic.  Eyes:     General: No scleral icterus.       Right eye: No discharge.        Left eye: No discharge.     Extraocular Movements: Extraocular movements intact.     Conjunctiva/sclera: Conjunctivae normal.     Pupils: Pupils are equal, round, and reactive to light.  Cardiovascular:     Rate and Rhythm: Normal  rate and regular rhythm.     Heart sounds: No murmur heard. Pulmonary:     Effort: Pulmonary effort is normal. No respiratory distress.     Breath sounds: Normal breath sounds. No wheezing, rhonchi or rales.  Musculoskeletal:     Cervical back: Neck supple. No tenderness.     Right lower leg: No edema.     Left lower leg: No edema.  Lymphadenopathy:     Cervical: No cervical adenopathy.  Skin:    General: Skin is warm and dry.     Coloration: Skin is not jaundiced or pale.     Findings: No erythema or rash.  Neurological:     Mental Status: She is alert and oriented to person, place, and time. Mental status is at baseline.     Motor: No weakness.     Gait: Gait normal.  Psychiatric:        Mood and Affect: Mood normal.        Behavior: Behavior normal.        Thought Content: Thought content normal.        Judgment: Judgment normal.     No results found. No results found. No results found for this or any previous visit (from the past 24 hour(s)).  Assessment/Plan: Carol Norton is a 76 y.o. female present for OV for  Generalized anxiety disorder/chronic benzodiazepine use/benzo agreement exists Stable Continue  Xanax 0.5 mg BID as needed -90-day prescription provided w/ refill.  - Patient has been  counseled on addictive properties of benzodiazepines as well as side effect profile in geriatric population.   - Controlled substance contract up-to-date - [place] controlled substance database has been reviewed 01/21/22  Hyperlipidemia/statin intolerance/elevated blood pressure reading Encouraged her to increase her exercise and decrease her saturated fats.   We briefly discussed other options such as Zetia to help with her cholesterol and she is very hesitant to start any type of medication. Her blood pressure in the office today is borderline.  She has great deal of anxiety surrounding medication secondary to her multiple side effects to many meds.  Currently I do not  believe the benefit is worth the risk of her anxiety increasing, thus causing elevated blood pressures on starting a low-dose BP med.  Patient agrees and reports she will work on her exercise and start a low-sodium diet.  She understands if it is elevated at her next visit we  will need to consider medication start. Bmp collected today   Hypothyroidism: Labs UTD Continue Armour Thyroid 60 mg daily   Foraminal stenosis of cervical region/Lumbar radiculopathy Stable Continue  Robaxin prescription for her that she uses for flares of her pain only.  She reports this does not cause her increased sedation. She also has a compounded formula she uses topically.  She has this filled at [facility].  She will bring this prescription in to ensure proper refills are called in for her.  Cold sores: Continue Valtrex as needed  Reviewed expectations re: course of current medical issues. Discussed self-management of symptoms. Outlined signs and symptoms indicating need for more acute intervention. Patient verbalized understanding and all questions were answered. Patient received an After-Visit Summary.   No orders of the defined types were placed in this encounter.   Meds ordered this encounter  Medications   ARMOUR THYROID 60 MG tablet    Sig: Take one tablet daily    Dispense:  90 tablet    Refill:  3   valACYclovir (VALTREX) 1000 MG tablet    Sig: 2 tabs at onset of cold sore and repeat dose once 12 hours later.    Dispense:  8 tablet    Refill:  5   methocarbamol (ROBAXIN) 500 MG tablet    Sig: Take 1 tablet (500 mg total) by mouth daily as needed for muscle spasms.    Dispense:  90 tablet    Refill:  1   ALPRAZolam (XANAX) 0.5 MG tablet    Sig: Take 1 tablet (0.5 mg total) by mouth 2 (two) times daily.    Dispense:  180 tablet    Refill:  1    Note is dictated utilizing voice recognition software. Although note has been proof read prior to signing, occasional typographical  errors still can be missed. If any questions arise, please do not hesitate to call for verification.   electronically signed by:  Howard Pouch, DO  [place]

## 2022-01-22 ENCOUNTER — Telehealth: Payer: Self-pay | Admitting: Family Medicine

## 2022-01-22 NOTE — Telephone Encounter (Signed)
Pt called and stating she believes she has gastritis and would like a medication sent into the pharmacy. She declined appointment along with suggestion to go to urgent care. She reports that she was just in yesterday and cant the Dr. Call something in.

## 2022-01-22 NOTE — Telephone Encounter (Signed)
Spoke with pt and informed her that I can send a message to her provider, but is currently out of the office. Pt stated that she will be leaving back to [facility] and will discuss with provider there at that time.

## 2022-09-24 ENCOUNTER — Ambulatory Visit: Payer: Medicare Other | Admitting: Family Medicine

## 2022-09-27 ENCOUNTER — Ambulatory Visit: Payer: Medicare Other | Admitting: Family Medicine

## 2022-09-27 ENCOUNTER — Encounter: Payer: Self-pay | Admitting: Family Medicine

## 2022-09-27 VITALS — BP 132/76 | HR 83 | Temp 97.9°F | Wt 202.4 lb

## 2022-09-27 DIAGNOSIS — E039 Hypothyroidism, unspecified: Secondary | ICD-10-CM

## 2022-09-27 DIAGNOSIS — Z789 Other specified health status: Secondary | ICD-10-CM

## 2022-09-27 DIAGNOSIS — Z79899 Other long term (current) drug therapy: Secondary | ICD-10-CM

## 2022-09-27 DIAGNOSIS — F411 Generalized anxiety disorder: Secondary | ICD-10-CM | POA: Diagnosis not present

## 2022-09-27 DIAGNOSIS — F1310 Sedative, hypnotic or anxiolytic abuse, uncomplicated: Secondary | ICD-10-CM | POA: Diagnosis not present

## 2022-09-27 MED ORDER — METHOCARBAMOL 500 MG PO TABS: 500.0000 mg | ORAL_TABLET | Freq: Every day | ORAL | 1 refills | Status: DC | PRN

## 2022-09-27 MED ORDER — VALACYCLOVIR HCL 1 G PO TABS: ORAL_TABLET | ORAL | 5 refills | Status: DC

## 2022-09-27 MED ORDER — ALPRAZOLAM 0.5 MG PO TABS: 0.5000 mg | ORAL_TABLET | Freq: Two times a day (BID) | ORAL | 1 refills | Status: DC

## 2022-09-27 NOTE — Patient Instructions (Signed)
No follow-ups on file.        Great to see you today.  I have refilled the medication(s) we provide.   If labs were collected, we will inform you of lab results once received either by echart message or telephone call.   - echart message- for normal results that have been seen by the patient already.   - telephone call: abnormal results or if patient has not viewed results in their echart.  

## 2022-09-27 NOTE — Progress Notes (Signed)
Carol Norton , January 23, 1946, 77 y.o., female MRN: [id number] Patient Care Team    Relationship Specialty Notifications Start End  Natalia Leatherwood, DO PCP - General Family Medicine  05/26/16   Dominica Severin, MD Consulting Physician Orthopedic Surgery  03/11/16   Laverta Baltimore, NP Nurse Practitioner Nurse Practitioner  05/26/16   Carlus Pavlov, MD Consulting Physician Internal Medicine  05/26/16    Comment: endocrine, thyroid  [name], Maree Krabbe, MD Referring Physician Pulmonary Disease  05/26/16   York Spaniel, MD (Inactive) Consulting Physician Neurology  05/26/16   Hillis Range, MD (Inactive) Consulting Physician Cardiology  05/26/16   Mateo Flow, MD Consulting Physician Ophthalmology  05/26/16    Comment: Dr Alben Spittle at [facility] for glaucoma and cataracts every 3 mos  Rodolph Bong, MD Attending Physician Family Medicine  05/26/16    Comment: [facility]    Chief Complaint  Patient presents with   Arthritis    CMC; does not want to do labs today; Having PRP procedure done and would like to come back in about a week to have labs completed; no labs completed in [facility]      Subjective: Carol Norton is a 77 y.o. female present for follow-up on chronic conditions Anxiety:  Patient reports compliance with Xanax 0.5 mg twice daily for anxiety and feels medication works well for her..    She has been unable to tolerate citalopram, Lexapro or Wellbutrin in the past.  She has many reactions to different types of medications.   She does have a controlled substance contract. Patient has been counseled on the addictive properties of benzodiazepines. She has been tried on other medications with side effects.   Hypothyroid: Patient reports compliance with Armour Thyroid 60 mg daily.  She denies any side effects today or worsening palpitations.  She was intolerant to levothyroxine/Synthroid.   HTN/Hyperlipidemia: Patient has had elevated cholesterol.  She is intolerant to statins.  Declined Zetia  and injectables.     09/27/2022   10:14 AM 01/21/2022    8:26 AM 08/27/2021    3:43 PM 08/19/2021    1:28 PM 03/09/2021    9:35 AM  Depression screen PHQ 2/9  Decreased Interest 0 0 0 0 0  Down, Depressed, Hopeless 0 0 0 0 0  PHQ - 2 Score 0 0 0 0 0  Altered sleeping  0  1 0  Tired, decreased energy  0  1 0  Change in appetite  0  0 0  Feeling bad or failure about yourself   0  0 0  Trouble concentrating  0  0 0  Moving slowly or fidgety/restless  0  0 0  Suicidal thoughts  0  0 0  PHQ-9 Score  0  2 0      01/21/2022    8:26 AM 08/19/2021    1:28 PM 03/09/2021    9:38 AM 02/13/2019    3:08 PM  GAD 7 : Generalized Anxiety Score  Nervous, Anxious, on Edge 1 1 1 2   Control/stop worrying 1 1 1 2   Worry too much - different things 1 2 1 2   Trouble relaxing 1 2 1 1   Restless 1 2 0 0  Easily annoyed or irritable 0 0 1 0  Afraid - awful might happen 1 0 1 2  Total GAD 7 Score 6 8 6 9   Anxiety Difficulty    Somewhat difficult   Allergies  Allergen Reactions   Synthroid [Levothyroxine  Sodium] Shortness Of Breath   Oxycodone-Acetaminophen Itching   Accolate [Zafirlukast]     Unknown reaction    Avelox [Moxifloxacin Hcl In Nacl]     Increased pulse rate and bp   Citalopram     Unknown reaction   Dexilant [Dexlansoprazole]     Unknown reaction    Escitalopram Oxalate     Couldn't function, lethargic    Meloxicam     No nsaids   Montelukast Other (See Comments)   Nsaids     Hair loss, couldn't empty bladder    Restasis [Cyclosporine]     Nasal/throat issues    Singulair [Montelukast Sodium]     Aggression,anger   Statins     Myalgias, joint pain. Elevated bp and heart rate    Tramadol     Burning of mouth   Wellbutrin [Bupropion] Hives   Dymista [Azelastine-Fluticasone]    Social History   Tobacco Use   Smoking status: Former    Packs/day: 0.75    Years: 25.00    Additional pack years: 0.00    Total pack years: 18.75    Types: Cigarettes    Quit date: 05/25/2007     Years since quitting: 15.3   Smokeless tobacco: Never   Tobacco comments:    quit for 10 years in the total 64yrs.   Substance Use Topics   Alcohol use: No    Comment: rare   Past Medical History:  Diagnosis Date   Allergic rhinitis    Allergy    Anxiety    Arthritis    Bilateral carpal tunnel syndrome    Patient scheduled for surgery with neurology   Cancer [facility])    skin cancer   Cataract    Chronic constipation    Chronic sinusitis    CN (constipation) 12/17/2014   Complication of anesthesia    with nerve block had hard time taking a breath for a day after surgery wtih ketamine slow to wake up   COVID-19 virus infection 05/2019   GERD (gastroesophageal reflux disease)    Glaucoma    Glaucoma    Hyperlipemia    Hypothyroid    Leg length discrepancy    Ocular rosacea    Plantar fasciitis    Sebaceous cyst    Sleep apnea    on CPAP   Tear of lateral meniscus of knee 02/17/2019   Urethral stenosis    Vitamin D deficiency    Past Surgical History:  Procedure Laterality Date   ABDOMINAL HYSTERECTOMY  1983   BREAST SURGERY     sebaceous cyst   DENTAL SURGERY  11/10/2020   EYE SURGERY     surgery with laser   FACIAL COSMETIC SURGERY     1995   GANGLION CYST EXCISION     MENISCUS REPAIR Left    REVERSE SHOULDER ARTHROPLASTY Right 05/20/2021   Procedure: REVERSE SHOULDER ARTHROPLASTY;  Surgeon: Bjorn Pippin, MD;  Location: [facility];  Service: Orthopedics;  Laterality: Right;   RHINOPLASTY     right carpal tunnel      right rotator cuff surgery      ROTATOR CUFF REPAIR Right 01/06/2021   SEPTOPLASTY     SYNOVECTOMY Right 05/20/2021   Procedure: SYNOVECTOMY;  Surgeon: Bjorn Pippin, MD;  Location: [facility];  Service: Orthopedics;  Laterality: Right;   TUBAL LIGATION     Family History  Problem Relation Age of Onset   Emphysema Father    Alcohol abuse Father  Allergies Mother    Heart disease Mother    Ovarian cancer Mother    Hyperlipidemia Mother     Hypothyroidism Mother    Arthritis Mother    Hypothyroidism Sister    Lung cancer Paternal Aunt    Diabetes Maternal Grandmother    Heart disease Maternal Grandmother    Stroke Paternal Grandfather    Allergies as of 09/27/2022       Reactions   Synthroid [levothyroxine Sodium] Shortness Of Breath   Oxycodone-acetaminophen Itching   Accolate [zafirlukast]    Unknown reaction    Avelox [moxifloxacin Hcl In Nacl]    Increased pulse rate and bp   Citalopram    Unknown reaction   Dexilant [dexlansoprazole]    Unknown reaction    Escitalopram Oxalate    Couldn't function, lethargic    Meloxicam    No nsaids   Montelukast Other (See Comments)   Nsaids    Hair loss, couldn't empty bladder    Restasis [cyclosporine]    Nasal/throat issues    Singulair [montelukast Sodium]    Aggression,anger   Statins    Myalgias, joint pain. Elevated bp and heart rate    Tramadol    Burning of mouth   Wellbutrin [bupropion] Hives   Dymista [azelastine-fluticasone]         Medication List        Accurate as of Sep 27, 2022 12:08 PM. If you have any questions, ask your nurse or doctor.          STOP taking these medications    amLODipine 2.5 MG tablet Commonly known as: NORVASC Stopped by: Felix Pacini, DO   polyethylene glycol 17 g packet Commonly known as: MIRALAX / GLYCOLAX Stopped by: Felix Pacini, DO       TAKE these medications    ALPRAZolam 0.5 MG tablet Commonly known as: XANAX Take 1 tablet (0.5 mg total) by mouth 2 (two) times daily.   Armour Thyroid 60 MG tablet Generic drug: thyroid Take one tablet daily   methocarbamol 500 MG tablet Commonly known as: ROBAXIN Take 1 tablet (500 mg total) by mouth daily as needed for muscle spasms.   NON FORMULARY Apply 1 application topically 4 (four) times daily as needed. KETAMINE HCL/BACLOFEN/CYCLOBENZAPRINE HCL/DICLOFENAC SOD/GABAPENTIN/LIDOCAINE HCL (TOPI)   Olopatadine HCl 0.6 % Soln Place 2 sprays into both  nostrils 2 (two) times daily.   PRESCRIPTION MEDICATION Place 1 drop into both eyes 3 (three) times daily as needed (dry eyes). Vital Tears compounded 30% autologous serum eye drops   sodium chloride 0.65 % Soln nasal spray Commonly known as: OCEAN Place 1 spray into both nostrils as needed for congestion.   valACYclovir 1000 MG tablet Commonly known as: VALTREX 2 tabs at onset of cold sore and repeat dose once 12 hours later.        All past medical history, surgical history, allergies, family history, immunizations andmedications were updated in the EMR today and reviewed under the history and medication portions of their EMR.     ROS: Negative, with the exception of above mentioned in HPI   Objective:  BP 132/76   Pulse 83   Temp 97.9 F (36.6 C)   Wt 202 lb 6.4 oz (91.8 kg)   SpO2 97%   BMI 32.67 kg/m  Body mass index is 32.67 kg/m. Physical Exam Vitals and nursing note reviewed.  Constitutional:      General: She is not in acute distress.    Appearance: Normal appearance.  She is not ill-appearing, toxic-appearing or diaphoretic.  HENT:     Head: Normocephalic and atraumatic.  Eyes:     General: No scleral icterus.       Right eye: No discharge.        Left eye: No discharge.     Extraocular Movements: Extraocular movements intact.     Conjunctiva/sclera: Conjunctivae normal.     Pupils: Pupils are equal, round, and reactive to light.  Cardiovascular:     Rate and Rhythm: Normal rate and regular rhythm.     Heart sounds: No murmur heard. Pulmonary:     Effort: Pulmonary effort is normal. No respiratory distress.     Breath sounds: Normal breath sounds. No wheezing, rhonchi or rales.  Musculoskeletal:     Cervical back: Neck supple. No tenderness.     Right lower leg: No edema.     Left lower leg: No edema.  Lymphadenopathy:     Cervical: No cervical adenopathy.  Skin:    General: Skin is warm and dry.     Coloration: Skin is not jaundiced or pale.      Findings: No erythema or rash.  Neurological:     Mental Status: She is alert and oriented to person, place, and time. Mental status is at baseline.     Motor: No weakness.     Gait: Gait normal.  Psychiatric:        Mood and Affect: Mood normal.        Behavior: Behavior normal.        Thought Content: Thought content normal.        Judgment: Judgment normal.     No results found. No results found. No results found for this or any previous visit (from the past 24 hour(s)).  Assessment/Plan: Carol Norton is a 77 y.o. female present for OV for  Generalized anxiety disorder/chronic benzodiazepine use/benzo agreement exists Stable Continue Xanax 0.5 mg BID as needed -90-day prescription provided w/ refill.  - Patient has been  counseled on addictive properties of benzodiazepines as well as side effect profile in geriatric population.   - Controlled substance contract up-to-date - [place] controlled substance database has been reviewed 09/27/22  Hyperlipidemia/statin intolerance/elevated blood pressure reading Encouraged her to increase her exercise and decrease her saturated fats.   We briefly discussed other options such as Zetia to help with her cholesterol and she is very hesitant to start any type of medication. Her blood pressure in the office today is borderline.  She has great deal of anxiety surrounding medication secondary to her multiple side effects to many meds.   CBC, CMP, TSH and lipids ordered for future 1 week   Hypothyroidism: Labs - future order 1 week.  Continue Armour Thyroid 60 mg daily.  Medications will be refilled after lab results received.  Foraminal stenosis of cervical region/Lumbar radiculopathy Stable and Continue Robaxin prescription for her that she uses for flares of her pain only.  She reports this does not cause her increased sedation. She also has a compounded formula she uses topically.  She has this filled at [facility].  She will  bring this prescription in to ensure proper refills are called in for her.  Cold sores: Stable Continue Valtrex as needed  Reviewed expectations re: course of current medical issues. Discussed self-management of symptoms. Outlined signs and symptoms indicating need for more acute intervention. Patient verbalized understanding and all questions were answered. Patient received an After-Visit Summary.   Orders  Placed This Encounter  Procedures   CBC   TSH   Lipid panel   Comp Met (CMET)   T4, free    Meds ordered this encounter  Medications   ALPRAZolam (XANAX) 0.5 MG tablet    Sig: Take 1 tablet (0.5 mg total) by mouth 2 (two) times daily.    Dispense:  180 tablet    Refill:  1   methocarbamol (ROBAXIN) 500 MG tablet    Sig: Take 1 tablet (500 mg total) by mouth daily as needed for muscle spasms.    Dispense:  90 tablet    Refill:  1   valACYclovir (VALTREX) 1000 MG tablet    Sig: 2 tabs at onset of cold sore and repeat dose once 12 hours later.    Dispense:  8 tablet    Refill:  5    Note is dictated utilizing voice recognition software. Although note has been proof read prior to signing, occasional typographical errors still can be missed. If any questions arise, please do not hesitate to call for verification.   electronically signed by:  Felix Pacini, DO  [facility] - OR

## 2022-10-04 ENCOUNTER — Other Ambulatory Visit: Payer: Medicare Other

## 2022-10-06 ENCOUNTER — Telehealth: Payer: Self-pay | Admitting: Family Medicine

## 2022-10-06 NOTE — Telephone Encounter (Signed)
Spoke to pt and she was given info for her to have labs at [place].

## 2022-10-06 NOTE — Telephone Encounter (Signed)
Patient called and is requesting to go to another location or an off site lab to have her labs drawn. She reports that her previous experiences have not been great here.  Please advise patient on how to go about getting labs drawn at another location.

## 2022-10-11 ENCOUNTER — Other Ambulatory Visit: Payer: Medicare Other

## 2022-10-19 ENCOUNTER — Other Ambulatory Visit (INDEPENDENT_AMBULATORY_CARE_PROVIDER_SITE_OTHER): Payer: Medicare Other

## 2022-10-19 DIAGNOSIS — Z789 Other specified health status: Secondary | ICD-10-CM

## 2022-10-19 DIAGNOSIS — E039 Hypothyroidism, unspecified: Secondary | ICD-10-CM

## 2022-10-19 LAB — COMPREHENSIVE METABOLIC PANEL
ALT: 13 U/L (ref 0–35)
AST: 18 U/L (ref 0–37)
Albumin: 4 g/dL (ref 3.5–5.2)
Alkaline Phosphatase: 79 U/L (ref 39–117)
BUN: 15 mg/dL (ref 6–23)
CO2: 26 mEq/L (ref 19–32)
Calcium: 9 mg/dL (ref 8.4–10.5)
Chloride: 104 mEq/L (ref 96–112)
Creatinine, Ser: 0.93 mg/dL (ref 0.40–1.20)
GFR: 59.81 mL/min — ABNORMAL LOW (ref >60.00)
Glucose, Bld: 100 mg/dL — ABNORMAL HIGH (ref 70–99)
Potassium: 4.3 mEq/L (ref 3.5–5.1)
Sodium: 138 mEq/L (ref 135–145)
Total Bilirubin: 0.5 mg/dL (ref 0.2–1.2)
Total Protein: 6.6 g/dL (ref 6.0–8.3)

## 2022-10-19 LAB — CBC
HCT: 40.4 % (ref 36.0–46.0)
Hemoglobin: 13 g/dL (ref 12.0–15.0)
MCHC: 32.2 g/dL (ref 30.0–36.0)
MCV: 93.1 fl (ref 78.0–100.0)
Platelets: 273 10*3/uL (ref 150.0–400.0)
RBC: 4.34 Mil/uL (ref 3.87–5.11)
RDW: 14.2 % (ref 11.5–15.5)
WBC: 8.4 10*3/uL (ref 4.0–10.5)

## 2022-10-19 LAB — LIPID PANEL
Cholesterol: 239 mg/dL — ABNORMAL HIGH (ref 0–200)
HDL: 69.2 mg/dL (ref >39.00)
LDL Cholesterol: 147 mg/dL — ABNORMAL HIGH (ref 0–99)
NonHDL: 169.37
Total CHOL/HDL Ratio: 3
Triglycerides: 114 mg/dL (ref 0.0–149.0)
VLDL: 22.8 mg/dL (ref 0.0–40.0)

## 2022-10-19 LAB — TSH: TSH: 0.92 u[IU]/mL (ref 0.35–5.50)

## 2022-10-19 LAB — T4, FREE: Free T4: 0.63 ng/dL (ref 0.60–1.60)

## 2022-10-25 ENCOUNTER — Telehealth: Payer: Self-pay | Admitting: Family Medicine

## 2022-10-25 MED ORDER — ARMOUR THYROID 60 MG PO TABS: ORAL_TABLET | ORAL | 3 refills | Status: DC

## 2022-10-25 NOTE — Telephone Encounter (Signed)
Please inform patient Blood cell counts, liver and kidney function are normal. Thyroid function is normal.  I have refilled her thyroid medication. Cholesterol panel is about the same with an LDL of 147, it is slightly improved from years past.

## 2022-10-25 NOTE — Telephone Encounter (Signed)
LM for pt to return call to discuss.  

## 2022-10-28 NOTE — Telephone Encounter (Signed)
noted 

## 2022-10-28 NOTE — Telephone Encounter (Signed)
Patient returned call about results. Read to him follow recommendations/suggestions from Dr. Claiborne Billings.  Patient seen lab results on mychart also.  Patient does not have any questions/concerns at this time

## 2022-12-01 ENCOUNTER — Telehealth: Payer: Self-pay | Admitting: Allergy and Immunology

## 2022-12-01 ENCOUNTER — Telehealth: Payer: Self-pay

## 2022-12-01 DIAGNOSIS — J3089 Other allergic rhinitis: Secondary | ICD-10-CM

## 2022-12-01 MED ORDER — OLOPATADINE HCL 0.6 % NA SOLN: 2.0000 | Freq: Two times a day (BID) | NASAL | 0 refills | Status: DC

## 2022-12-01 NOTE — Telephone Encounter (Signed)
Spoke to patient and advised her that she would need an appointment before the office could refill any prescriptions since she has not been here since 2022. Patient stated that her PCP has written a prescription for the olopatadine nasal spray. However, if her PCP will not continue to fill the prescription she will contact the office back to make an appointment. Patient also stated that she has not lived in the area for almost a year due to special circumstances.

## 2022-12-01 NOTE — Telephone Encounter (Signed)
Patient called to see if she can get a refill on her olopatadine eye drops // [facility] [place].  [address] [phone]

## 2022-12-01 NOTE — Telephone Encounter (Signed)
Prescription Request  12/01/2022  LOV: Visit date not found  What is the name of the medication or equipment?  methocarbamol (ROBAXIN) 500 MG tablet Olopatadine HCl 0.6 % SOLN    Have you contacted your pharmacy to request a refill? No   Which pharmacy would you like this sent to?  [id number] - [place], [place] - [address] [place] [address] Phone: [phone] Fax: [phone]    Patient notified that their request is being sent to the clinical staff for review and that they should receive a response within 2 business days.   Please advise at Mobile [phone] (mobile)

## 2022-12-01 NOTE — Telephone Encounter (Signed)
Rx sent and pharmacy will refill robaxin as prescribed by PCP. Pt is aware

## 2022-12-24 ENCOUNTER — Other Ambulatory Visit: Payer: Self-pay | Admitting: Family Medicine

## 2022-12-24 DIAGNOSIS — J3089 Other allergic rhinitis: Secondary | ICD-10-CM

## 2023-03-09 DIAGNOSIS — H353221 Exudative age-related macular degeneration, left eye, with active choroidal neovascularization: Secondary | ICD-10-CM | POA: Insufficient documentation

## 2023-03-09 DIAGNOSIS — H401122 Primary open-angle glaucoma, left eye, moderate stage: Secondary | ICD-10-CM | POA: Insufficient documentation

## 2023-03-09 DIAGNOSIS — H353112 Nonexudative age-related macular degeneration, right eye, intermediate dry stage: Secondary | ICD-10-CM | POA: Insufficient documentation

## 2023-03-09 DIAGNOSIS — H401111 Primary open-angle glaucoma, right eye, mild stage: Secondary | ICD-10-CM | POA: Insufficient documentation

## 2023-04-27 ENCOUNTER — Ambulatory Visit: Payer: Medicare Other | Admitting: Family Medicine

## 2023-04-27 DIAGNOSIS — F411 Generalized anxiety disorder: Secondary | ICD-10-CM

## 2023-05-03 ENCOUNTER — Encounter: Payer: Self-pay | Admitting: Family Medicine

## 2023-05-03 ENCOUNTER — Ambulatory Visit: Payer: Medicare Other | Admitting: Family Medicine

## 2023-05-03 VITALS — BP 142/72 | HR 81 | Temp 97.4°F | Wt 199.2 lb

## 2023-05-03 DIAGNOSIS — E782 Mixed hyperlipidemia: Secondary | ICD-10-CM | POA: Diagnosis not present

## 2023-05-03 DIAGNOSIS — F1310 Sedative, hypnotic or anxiolytic abuse, uncomplicated: Secondary | ICD-10-CM | POA: Diagnosis not present

## 2023-05-03 DIAGNOSIS — J3089 Other allergic rhinitis: Secondary | ICD-10-CM

## 2023-05-03 DIAGNOSIS — E66811 Obesity, class 1: Secondary | ICD-10-CM

## 2023-05-03 DIAGNOSIS — B001 Herpesviral vesicular dermatitis: Secondary | ICD-10-CM

## 2023-05-03 DIAGNOSIS — E039 Hypothyroidism, unspecified: Secondary | ICD-10-CM

## 2023-05-03 DIAGNOSIS — F411 Generalized anxiety disorder: Secondary | ICD-10-CM

## 2023-05-03 DIAGNOSIS — Z23 Encounter for immunization: Secondary | ICD-10-CM

## 2023-05-03 MED ORDER — ARMOUR THYROID 60 MG PO TABS: ORAL_TABLET | ORAL | 3 refills | Status: AC

## 2023-05-03 MED ORDER — METHOCARBAMOL 500 MG PO TABS: 500.0000 mg | ORAL_TABLET | Freq: Every day | ORAL | 1 refills | Status: DC | PRN

## 2023-05-03 MED ORDER — OLOPATADINE HCL 0.6 % NA SOLN: 2.0000 | Freq: Two times a day (BID) | NASAL | 11 refills | Status: DC

## 2023-05-03 MED ORDER — VALACYCLOVIR HCL 1 G PO TABS: ORAL_TABLET | ORAL | 5 refills | Status: DC

## 2023-05-03 MED ORDER — ALPRAZOLAM 0.5 MG PO TABS: 0.5000 mg | ORAL_TABLET | Freq: Two times a day (BID) | ORAL | 1 refills | Status: DC

## 2023-05-03 NOTE — Progress Notes (Signed)
Carol Norton , [birth year], 77 y.o., female MRN: [id number] Patient Care Team    Relationship Specialty Notifications Start End  Natalia Leatherwood, DO PCP - General Family Medicine  05/26/16   Dominica Severin, MD Consulting Physician Orthopedic Surgery  03/11/16   Laverta Baltimore, NP Nurse Practitioner Nurse Practitioner  05/26/16   Carlus Pavlov, MD Consulting Physician Internal Medicine  05/26/16    Comment: endocrine, thyroid  [name], Maree Krabbe, MD Referring Physician Pulmonary Disease  05/26/16   York Spaniel, MD (Inactive) Consulting Physician Neurology  05/26/16   Hillis Range, MD (Inactive) Consulting Physician Cardiology  05/26/16   Mateo Flow, MD Consulting Physician Ophthalmology  05/26/16    Comment: Dr Alben Spittle at [facility] for glaucoma and cataracts every 3 mos  Rodolph Bong, MD Attending Physician Family Medicine  05/26/16    Comment: [facility]    Chief Complaint  Patient presents with   Anxiety     Subjective: KEYUANA SENFT is a 77 y.o. female present for Chronic Conditions/illness Management  Anxiety:  Patient reports compliance with Xanax 0.5 mg twice daily for anxiety and feels medication works well  for her..    She has been unable to tolerate citalopram, Lexapro or Wellbutrin in the past.  She has many reactions to different types of medications.   She does have a controlled substance contract. Patient has been counseled on the addictive properties of benzodiazepines. She has been tried on other medications with side effects.   Hypothyroid: Patient reports compliance  with Armour Thyroid 60 mg daily.  She denies any side effects today or worsening palpitations.  She was intolerant to levothyroxine/Synthroid.   HTN/Hyperlipidemia: Patient has had elevated cholesterol.  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     09/27/2022   10:14 AM 01/21/2022    8:26 AM 08/27/2021    3:43 PM 08/19/2021    1:28 PM 03/09/2021    9:35 AM   Depression screen PHQ 2/9  Decreased Interest 0 0 0 0 0  Down, Depressed, Hopeless 0 0 0 0 0  PHQ - 2 Score 0 0 0 0 0  Altered sleeping  0  1 0  Tired, decreased energy  0  1 0  Change in appetite  0  0 0  Feeling bad or failure about yourself   0  0 0  Trouble concentrating  0  0 0  Moving slowly or fidgety/restless  0  0 0  Suicidal thoughts  0  0 0  PHQ-9 Score  0  2 0      01/21/2022    8:26 AM 08/19/2021    1:28 PM 03/09/2021    9:38 AM 02/13/2019    3:08 PM  GAD 7 : Generalized Anxiety Score  Nervous, Anxious, on Edge 1 1 1 2   Control/stop worrying 1 1 1 2   Worry too much - different things 1 2 1 2   Trouble relaxing 1 2 1 1   Restless 1 2 0 0  Easily annoyed or irritable 0 0 1 0  Afraid - awful might happen 1 0 1 2  Total GAD 7 Score 6 8 6 9   Anxiety Difficulty    Somewhat difficult   Allergies  Allergen Reactions   Synthroid [Levothyroxine Sodium] Shortness Of Breath   Oxycodone-Acetaminophen Itching   Accolate [Zafirlukast]     Unknown reaction    Avelox [Moxifloxacin Hcl In Nacl]     Increased  pulse rate and bp   Citalopram     Unknown reaction   Dexilant [Dexlansoprazole]     Unknown reaction    Escitalopram Oxalate     Couldn't function, lethargic    Meloxicam     No nsaids   Montelukast Other (See Comments)   Nsaids     Hair loss, couldn't empty bladder    Restasis [Cyclosporine]     Nasal/throat issues    Singulair [Montelukast Sodium]     Aggression,anger   Statins     Myalgias, joint pain. Elevated bp and heart rate    Tramadol     Burning of mouth   Wellbutrin [Bupropion] Hives   Dymista [Azelastine-Fluticasone]    Social History   Tobacco Use   Smoking status: Former    Current packs/day: 0.00    Average packs/day: 0.8 packs/day for 25.0 years (18.8 ttl pk-yrs)    Types: Cigarettes    Start date: 05/24/1982    Quit date: 05/25/2007    Years since quitting: 15.9   Smokeless tobacco: Never   Tobacco comments:    quit for 10 years in the  total 92yrs.   Substance Use Topics   Alcohol use: No    Comment: rare   Past Medical History:  Diagnosis Date   Allergic rhinitis    Allergy    Anxiety    Arthritis    Bilateral carpal tunnel syndrome    Patient scheduled for surgery with neurology   Cancer [facility])    skin cancer   Cataract    Chronic constipation    Chronic sinusitis    CN (constipation) 12/17/2014   Complication of anesthesia    with nerve block had hard time taking a breath for a day after surgery wtih ketamine slow to wake up   COVID-19 virus infection 05/2019   GERD (gastroesophageal reflux disease)    Glaucoma    Glaucoma    Hyperlipemia    Hypothyroid    Leg length discrepancy    Ocular rosacea    Plantar fasciitis    Sebaceous cyst    Sleep apnea    on CPAP   Tear of lateral meniscus of knee 02/17/2019   Urethral stenosis    Vitamin D deficiency    Past Surgical History:  Procedure Laterality Date   ABDOMINAL HYSTERECTOMY  1983   BREAST SURGERY     sebaceous cyst   DENTAL SURGERY  11/10/2020   EYE SURGERY     surgery with laser   FACIAL COSMETIC SURGERY     1995   GANGLION CYST EXCISION     MENISCUS REPAIR Left    REVERSE SHOULDER ARTHROPLASTY Right 05/20/2021   Procedure: REVERSE SHOULDER ARTHROPLASTY;  Surgeon: Bjorn Pippin, MD;  Location: [facility];  Service: Orthopedics;  Laterality: Right;   RHINOPLASTY     right carpal tunnel      right rotator cuff surgery      ROTATOR CUFF REPAIR Right 01/06/2021   SEPTOPLASTY     SYNOVECTOMY Right 05/20/2021   Procedure: SYNOVECTOMY;  Surgeon: Bjorn Pippin, MD;  Location: [facility];  Service: Orthopedics;  Laterality: Right;   TUBAL LIGATION     Family History  Problem Relation Age of Onset   Emphysema Father    Alcohol abuse Father    Allergies Mother    Heart disease Mother    Ovarian cancer Mother    Hyperlipidemia Mother    Hypothyroidism Mother    Arthritis Mother  Hypothyroidism Sister    Lung cancer Paternal Aunt    Diabetes  Maternal Grandmother    Heart disease Maternal Grandmother    Stroke Paternal Grandfather    Allergies as of 05/03/2023       Reactions   Synthroid [levothyroxine Sodium] Shortness Of Breath   Oxycodone-acetaminophen Itching   Accolate [zafirlukast]    Unknown reaction    Avelox [moxifloxacin Hcl In Nacl]    Increased pulse rate and bp   Citalopram    Unknown reaction   Dexilant [dexlansoprazole]    Unknown reaction    Escitalopram Oxalate    Couldn't function, lethargic    Meloxicam    No nsaids   Montelukast Other (See Comments)   Nsaids    Hair loss, couldn't empty bladder    Restasis [cyclosporine]    Nasal/throat issues    Singulair [montelukast Sodium]    Aggression,anger   Statins    Myalgias, joint pain. Elevated bp and heart rate    Tramadol    Burning of mouth   Wellbutrin [bupropion] Hives   Dymista [azelastine-fluticasone]         Medication List        Accurate as of May 03, 2023 10:50 AM. If you have any questions, ask your nurse or doctor.          STOP taking these medications    latanoprost 0.005 % ophthalmic solution Commonly known as: XALATAN Stopped by: Felix Pacini   prednisoLONE acetate 1 % ophthalmic suspension Commonly known as: PRED FORTE Stopped by: Felix Pacini       TAKE these medications    ALPRAZolam 0.5 MG tablet Commonly known as: XANAX Take 1 tablet (0.5 mg total) by mouth 2 (two) times daily.   Armour Thyroid 60 MG tablet Generic drug: thyroid Take one tablet daily   methocarbamol 500 MG tablet Commonly known as: ROBAXIN Take 1 tablet (500 mg total) by mouth daily as needed for muscle spasms.   NON FORMULARY Apply 1 application topically 4 (four) times daily as needed. KETAMINE HCL/BACLOFEN/CYCLOBENZAPRINE HCL/DICLOFENAC SOD/GABAPENTIN/LIDOCAINE HCL (TOPI)   Olopatadine HCl 0.6 % Soln Place 2 sprays into both nostrils 2 (two) times daily.   PRESCRIPTION MEDICATION Place 1 drop into both eyes 3  (three) times daily as needed (dry eyes). Vital Tears compounded 30% autologous serum eye drops   sodium chloride 0.65 % Soln nasal spray Commonly known as: OCEAN Place 1 spray into both nostrils as needed for congestion.   valACYclovir 1000 MG tablet Commonly known as: VALTREX 2 tabs at onset of cold sore and repeat dose once 12 hours later.        All past medical history, surgical history, allergies, family history, immunizations andmedications were updated in the EMR today and reviewed under the history and medication portions of their EMR.     ROS: Negative, with the exception of above mentioned in HPI   Objective:  BP (!) 142/72   Pulse 81   Temp (!) 97.4 F (36.3 C)   Wt 199 lb 3.2 oz (90.4 kg)   SpO2 97%   BMI 32.15 kg/m  Body mass index is 32.15 kg/m. Physical Exam Vitals and nursing note reviewed.  Constitutional:      General: She is not in acute distress.    Appearance: Normal appearance. She is not ill-appearing, toxic-appearing or diaphoretic.  HENT:     Head: Normocephalic and atraumatic.  Eyes:     General: No scleral icterus.  Right eye: No discharge.        Left eye: No discharge.     Extraocular Movements: Extraocular movements intact.     Conjunctiva/sclera: Conjunctivae normal.     Pupils: Pupils are equal, round, and reactive to light.  Cardiovascular:     Rate and Rhythm: Normal rate and regular rhythm.     Heart sounds: No murmur heard. Pulmonary:     Effort: Pulmonary effort is normal. No respiratory distress.     Breath sounds: Normal breath sounds. No wheezing, rhonchi or rales.  Musculoskeletal:     Right lower leg: No edema.     Left lower leg: No edema.  Skin:    General: Skin is warm and dry.     Coloration: Skin is not jaundiced or pale.     Findings: No erythema or rash.  Neurological:     Mental Status: She is alert and oriented to person, place, and time. Mental status is at baseline.     Motor: No weakness.     Gait:  Gait normal.  Psychiatric:        Mood and Affect: Mood normal.        Behavior: Behavior normal.        Thought Content: Thought content normal.        Judgment: Judgment normal.     No results found. No results found. No results found for this or any previous visit (from the past 24 hour(s)).  Assessment/Plan: MANUELA ROSENDAHL is a 77 y.o. female present for OV for  Generalized anxiety disorder/chronic benzodiazepine use/benzo agreement exists Stable Continue Xanax 0.5 mg BID as needed -90-day prescription provided w/ refill.  - Patient has been  counseled on addictive properties of benzodiazepines as well as side effect profile in geriatric population.   - Controlled substance contract up-to-date - [place] controlled substance database has been reviewed 05/03/23  Hyperlipidemia/statin intolerance/elevated blood pressure reading Encouraged her to increase her exercise and decrease her saturated fats.   We briefly discussed other options such as Zetia to help with her cholesterol and she is very hesitant to start any type of medication. Her blood pressure in the office today is borderline- again.   She has great deal of anxiety surrounding medication secondary to her multiple side effects to many meds.    Hypothyroidism: TSH UTD- 09/2022 Continue Armour Thyroid 60 mg daily.   Foraminal stenosis of cervical region/Lumbar radiculopathy stable Continue Robaxin prescription for her that she uses for flares of her pain only.  She reports this does not cause her increased sedation. She also has a compounded formula she uses topically.  She has this filled at [facility].  She will bring this prescription in to ensure proper refills are called in for her.  Cold sores: Stable  Continue Valtrex as needed  Reviewed expectations re: course of current medical issues. Discussed self-management of symptoms. Outlined signs and symptoms indicating need for more acute  intervention. Patient verbalized understanding and all questions were answered. Patient received an After-Visit Summary.   No orders of the defined types were placed in this encounter.   Meds ordered this encounter  Medications   ALPRAZolam (XANAX) 0.5 MG tablet    Sig: Take 1 tablet (0.5 mg total) by mouth 2 (two) times daily.    Dispense:  180 tablet    Refill:  1   methocarbamol (ROBAXIN) 500 MG tablet    Sig: Take 1 tablet (500 mg total) by mouth daily  as needed for muscle spasms.    Dispense:  90 tablet    Refill:  1   valACYclovir (VALTREX) 1000 MG tablet    Sig: 2 tabs at onset of cold sore and repeat dose once 12 hours later.    Dispense:  8 tablet    Refill:  5   ARMOUR THYROID 60 MG tablet    Sig: Take one tablet daily    Dispense:  90 tablet    Refill:  3   Olopatadine HCl 0.6 % SOLN    Sig: Place 2 sprays into both nostrils 2 (two) times daily.    Dispense:  30.5 g    Refill:  11    Note is dictated utilizing voice recognition software. Although note has been proof read prior to signing, occasional typographical errors still can be missed. If any questions arise, please do not hesitate to call for verification.   electronically signed by:  Felix Pacini, DO  [facility] - [place]

## 2023-05-03 NOTE — Patient Instructions (Addendum)

## 2023-07-07 ENCOUNTER — Telehealth: Payer: Self-pay

## 2023-07-07 NOTE — Telephone Encounter (Signed)
Did the patient discuss referral with their provider in the last year? Yes - but patient is currently in [place]  (If No - schedule appointment)  (If Yes - send message)  Appointment offered? No - patient currently in [place]   Type of order/referral and detailed reason for visit: Engineer, production of office, provider, location: [place], [place] Fax: [phone] Send 3-6 months office visit notes, Demographics, Insurance, Referral, Diagnosis   If referral order, have you been seen by this specialty before? No  (If Yes, this issue or another issue? When? Where? Yes - Diagnosed by Dr Neale Burly in [place].   Can we respond through MyChart? No

## 2023-07-08 ENCOUNTER — Telehealth: Payer: Medicare Other | Admitting: Family Medicine

## 2023-07-08 DIAGNOSIS — R194 Change in bowel habit: Secondary | ICD-10-CM

## 2023-07-08 NOTE — Telephone Encounter (Signed)
Pt has appt today 2/14

## 2023-07-08 NOTE — Progress Notes (Unsigned)
     Was able to successfully connect patient for virtual visit.  Unfortunately, was unable to proceed with virtual visit secondary to patient being currently in [place], and not [place].    As a courtesy, I did go ahead and place gastroenterology referral.

## 2023-07-29 ENCOUNTER — Encounter: Payer: Self-pay | Admitting: Physician Assistant

## 2023-09-14 ENCOUNTER — Ambulatory Visit: Admitting: Physician Assistant

## 2023-10-18 ENCOUNTER — Encounter: Payer: Self-pay | Admitting: Family Medicine

## 2023-10-18 ENCOUNTER — Ambulatory Visit: Payer: Medicare Other | Admitting: Family Medicine

## 2023-10-18 VITALS — BP 138/72 | HR 75 | Temp 97.8°F | Wt 189.0 lb

## 2023-10-18 DIAGNOSIS — F411 Generalized anxiety disorder: Secondary | ICD-10-CM | POA: Diagnosis not present

## 2023-10-18 DIAGNOSIS — M4802 Spinal stenosis, cervical region: Secondary | ICD-10-CM

## 2023-10-18 DIAGNOSIS — E039 Hypothyroidism, unspecified: Secondary | ICD-10-CM | POA: Diagnosis not present

## 2023-10-18 DIAGNOSIS — E66811 Obesity, class 1: Secondary | ICD-10-CM

## 2023-10-18 DIAGNOSIS — E559 Vitamin D deficiency, unspecified: Secondary | ICD-10-CM

## 2023-10-18 DIAGNOSIS — B001 Herpesviral vesicular dermatitis: Secondary | ICD-10-CM

## 2023-10-18 DIAGNOSIS — E782 Mixed hyperlipidemia: Secondary | ICD-10-CM

## 2023-10-18 DIAGNOSIS — F1310 Sedative, hypnotic or anxiolytic abuse, uncomplicated: Secondary | ICD-10-CM

## 2023-10-18 DIAGNOSIS — M5416 Radiculopathy, lumbar region: Secondary | ICD-10-CM

## 2023-10-18 MED ORDER — ALPRAZOLAM 0.5 MG PO TABS: 0.5000 mg | ORAL_TABLET | Freq: Two times a day (BID) | ORAL | 1 refills | Status: AC

## 2023-10-18 MED ORDER — METHOCARBAMOL 500 MG PO TABS: 500.0000 mg | ORAL_TABLET | Freq: Every day | ORAL | 1 refills | Status: AC | PRN

## 2023-10-18 MED ORDER — VALACYCLOVIR HCL 1 G PO TABS: ORAL_TABLET | ORAL | 5 refills | Status: AC

## 2023-10-18 NOTE — Patient Instructions (Signed)

## 2023-10-18 NOTE — Progress Notes (Addendum)
 Carol Norton , 1945-09-02, 78 y.o., female MRN: [id number] Patient Care Team    Relationship Specialty Notifications Start End  Mariel Shope, DO PCP - General Family Medicine  05/26/16   Ronn Cohn, MD Consulting Physician Orthopedic Surgery  03/11/16   Evert Hodgkins, NP Nurse Practitioner Nurse Practitioner  05/26/16   Emilie Harden, MD Consulting Physician Internal Medicine  05/26/16    Comment: endocrine, thyroid   [name], Deena Farrier, MD Referring Physician Pulmonary Disease  05/26/16   Brian Campanile, MD (Inactive) Consulting Physician Neurology  05/26/16   Jolly Needle, MD (Inactive) Consulting Physician Cardiology  05/26/16   Amedeo Jupiter, MD Consulting Physician Ophthalmology  05/26/16    Comment: Dr Reyne Cave at [facility] for glaucoma and cataracts every 3 mos  Syliva Even, MD Attending Physician Family Medicine  05/26/16    Comment: [facility]    Chief Complaint  Patient presents with   Anxiety     Subjective: Carol Norton is a 78 y.o. female present for Chronic Conditions/illness Management  Anxiety:  Patient reports compliance with Xanax  0.5 mg twice daily for anxiety and feels medication works well  for her..    She has been unable to tolerate citalopram, Lexapro or Wellbutrin in the past.  She has many reactions to different types of medications.   She does have a controlled substance contract. Patient has been counseled on the addictive properties of benzodiazepines. She has been tried on other medications with side effects.   Hypothyroid: Patient reports compliance with Armour Thyroid  60 mg daily.  She denies any side effects today or worsening palpitations.  She was intolerant to levothyroxine/Synthroid .   HTN/Hyperlipidemia: Patient has had elevated cholesterol.  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  Reviewed outside labs in EMR collected [facility] 07/20/2023: Celiac panel normal CBC with differential normal CMP  unremarkable with the exception of a GFR of 57 and a glucose of 106 Lipid panel total cholesterol 241, triglycerides 70, HDL 79, LDL 150 Hemoglobin A1c 6.3 TSH normal 1.12 Has Cardio in [place]  also.       09/27/2022   10:14 AM 01/21/2022    8:26 AM 08/27/2021    3:43 PM 08/19/2021    1:28 PM 03/09/2021    9:35 AM  Depression screen PHQ 2/9  Decreased Interest 0 0 0 0 0  Down, Depressed, Hopeless 0 0 0 0 0  PHQ - 2 Score 0 0 0 0 0  Altered sleeping  0  1 0  Tired, decreased energy  0  1 0  Change in appetite  0  0 0  Feeling bad or failure about yourself   0  0 0  Trouble concentrating  0  0 0  Moving slowly or fidgety/restless  0  0 0  Suicidal thoughts  0  0 0  PHQ-9 Score  0  2 0      01/21/2022    8:26 AM 08/19/2021    1:28 PM 03/09/2021    9:38 AM 02/13/2019    3:08 PM  GAD 7 : Generalized Anxiety Score  Nervous, Anxious, on Edge 1 1 1 2   Control/stop worrying 1 1 1 2   Worry too much - different things 1 2 1 2   Trouble relaxing 1 2 1 1   Restless 1 2 0 0  Easily annoyed or irritable 0 0 1 0  Afraid - awful might happen 1 0 1 2  Total GAD  7 Score 6 8 6 9   Anxiety Difficulty    Somewhat difficult   Allergies  Allergen Reactions   Amlodipine Shortness Of Breath and Swelling   Atenolol Other (See Comments)    Hair loss, insomnia   Synthroid  [Levothyroxine Sodium ] Shortness Of Breath   Oxycodone-Acetaminophen  Itching   Progesterone Other (See Comments)    Rapid heart rate   Accolate  [Zafirlukast ]     Unknown reaction    Avelox [Moxifloxacin Hcl In Nacl]     Increased pulse rate and bp   Citalopram     Unknown reaction   Dexilant [Dexlansoprazole]     Unknown reaction    Escitalopram Oxalate     Couldn't function, lethargic    Meloxicam      No nsaids   Montelukast  Other (See Comments)   Nsaids     Hair loss, couldn't empty bladder    Olopatadine  Nausea And Vomiting   Restasis [Cyclosporine]     Nasal/throat issues    Singulair  [Montelukast  Sodium]      Aggression,anger   Statins     Myalgias, joint pain. Elevated bp and heart rate    Tramadol     Burning of mouth   Wellbutrin [Bupropion] Hives   Dymista [Azelastine-Fluticasone]    Social History   Tobacco Use   Smoking status: Former    Current packs/day: 0.00    Average packs/day: 0.8 packs/day for 25.0 years (18.8 ttl pk-yrs)    Types: Cigarettes    Start date: 05/24/1982    Quit date: 05/25/2007    Years since quitting: 16.4   Smokeless tobacco: Never   Tobacco comments:    quit for 10 years in the total 59yrs.   Substance Use Topics   Alcohol use: No    Comment: rare   Past Medical History:  Diagnosis Date   Allergic rhinitis    Allergy    Anxiety    Arthritis    Bilateral carpal tunnel syndrome    Patient scheduled for surgery with neurology   Cancer [facility])    skin cancer   Cataract    Chronic constipation    Chronic sinusitis    CN (constipation) 12/17/2014   Complication of anesthesia    with nerve block had hard time taking a breath for a day after surgery wtih ketamine slow to wake up   COVID-19 virus infection 05/2019   GERD (gastroesophageal reflux disease)    Glaucoma    Glaucoma    Hyperlipemia    Hypothyroid    Leg length discrepancy    Ocular rosacea    Plantar fasciitis    Sebaceous cyst    Sleep apnea    on CPAP   Tear of lateral meniscus of knee 02/17/2019   Urethral stenosis    Vitamin D  deficiency    Past Surgical History:  Procedure Laterality Date   ABDOMINAL HYSTERECTOMY  1983   BREAST SURGERY     sebaceous cyst   DENTAL SURGERY  11/10/2020   EYE SURGERY     surgery with laser   FACIAL COSMETIC SURGERY     1995   GANGLION CYST EXCISION     MENISCUS REPAIR Left    REVERSE SHOULDER ARTHROPLASTY Right 05/20/2021   Procedure: REVERSE SHOULDER ARTHROPLASTY;  Surgeon: Micheline Ahr, MD;  Location: [facility];  Service: Orthopedics;  Laterality: Right;   RHINOPLASTY     right carpal tunnel      right rotator cuff surgery      ROTATOR  CUFF REPAIR Right 01/06/2021   SEPTOPLASTY     SYNOVECTOMY Right 05/20/2021   Procedure: SYNOVECTOMY;  Surgeon: Micheline Ahr, MD;  Location: [facility];  Service: Orthopedics;  Laterality: Right;   TUBAL LIGATION     Family History  Problem Relation Age of Onset   Emphysema Father    Alcohol abuse Father    Allergies Mother    Heart disease Mother    Ovarian cancer Mother    Hyperlipidemia Mother    Hypothyroidism Mother    Arthritis Mother    Hypothyroidism Sister    Lung cancer Paternal Aunt    Diabetes Maternal Grandmother    Heart disease Maternal Grandmother    Stroke Paternal Grandfather    Allergies as of 10/18/2023       Reactions   Amlodipine Shortness Of Breath, Swelling   Atenolol Other (See Comments)   Hair loss, insomnia   Synthroid  [levothyroxine Sodium ] Shortness Of Breath   Oxycodone-acetaminophen  Itching   Progesterone Other (See Comments)   Rapid heart rate   Accolate  [zafirlukast ]    Unknown reaction    Avelox [moxifloxacin Hcl In Nacl]    Increased pulse rate and bp   Citalopram    Unknown reaction   Dexilant [dexlansoprazole]    Unknown reaction    Escitalopram Oxalate    Couldn't function, lethargic    Meloxicam     No nsaids   Montelukast  Other (See Comments)   Nsaids    Hair loss, couldn't empty bladder    Olopatadine  Nausea And Vomiting   Restasis [cyclosporine]    Nasal/throat issues    Singulair  [montelukast  Sodium]    Aggression,anger   Statins    Myalgias, joint pain. Elevated bp and heart rate    Tramadol    Burning of mouth   Wellbutrin [bupropion] Hives   Dymista [azelastine-fluticasone]         Medication List        Accurate as of Oct 18, 2023 11:47 AM. If you have any questions, ask your nurse or doctor.          STOP taking these medications    Olopatadine  HCl 0.6 % Soln Stopped by: Napolean Backbone       TAKE these medications    ALPRAZolam  0.5 MG tablet Commonly known as: XANAX  Take 1 tablet (0.5 mg  total) by mouth 2 (two) times daily.   Armour Thyroid  60 MG tablet Generic drug: thyroid  Take one tablet daily   methocarbamol  500 MG tablet Commonly known as: ROBAXIN  Take 1 tablet (500 mg total) by mouth daily as needed for muscle spasms.   NON FORMULARY Apply 1 application topically 4 (four) times daily as needed. KETAMINE HCL/BACLOFEN/CYCLOBENZAPRINE HCL/DICLOFENAC SOD/GABAPENTIN /LIDOCAINE  HCL (TOPI)   PRESCRIPTION MEDICATION Place 1 drop into both eyes 3 (three) times daily as needed (dry eyes). Vital Tears compounded 30% autologous serum eye drops   sodium chloride  0.65 % Soln nasal spray Commonly known as: OCEAN Place 1 spray into both nostrils as needed for congestion.   valACYclovir  1000 MG tablet Commonly known as: VALTREX  2 tabs at onset of cold sore and repeat dose once 12 hours later.        All past medical history, surgical history, allergies, family history, immunizations andmedications were updated in the EMR today and reviewed under the history and medication portions of their EMR.     ROS: Negative, with the exception of above mentioned in HPI   Objective:  BP 138/72   Pulse 75  Temp 97.8 F (36.6 C)   Wt 189 lb (85.7 kg)   SpO2 97%   BMI 30.51 kg/m  Body mass index is 30.51 kg/m. Physical Exam Vitals and nursing note reviewed.  Constitutional:      General: She is not in acute distress.    Appearance: Normal appearance. She is not ill-appearing, toxic-appearing or diaphoretic.  HENT:     Head: Normocephalic and atraumatic.  Eyes:     General: No scleral icterus.       Right eye: No discharge.        Left eye: No discharge.     Extraocular Movements: Extraocular movements intact.     Conjunctiva/sclera: Conjunctivae normal.     Pupils: Pupils are equal, round, and reactive to light.  Cardiovascular:     Rate and Rhythm: Normal rate and regular rhythm.     Heart sounds: No murmur heard. Pulmonary:     Effort: Pulmonary effort is normal.  No respiratory distress.     Breath sounds: Normal breath sounds. No wheezing, rhonchi or rales.  Musculoskeletal:     Right lower leg: No edema.     Left lower leg: No edema.  Skin:    General: Skin is warm and dry.     Coloration: Skin is not jaundiced or pale.     Findings: No erythema or rash.  Neurological:     Mental Status: She is alert and oriented to person, place, and time. Mental status is at baseline.     Motor: No weakness.     Gait: Gait normal.  Psychiatric:        Mood and Affect: Mood normal.        Behavior: Behavior normal.        Thought Content: Thought content normal.        Judgment: Judgment normal.     No results found. No results found. No results found for this or any previous visit (from the past 24 hours).  Assessment/Plan: Carol Norton is a 78 y.o. female present for OV for chronic condition management Generalized anxiety disorder/chronic benzodiazepine use/benzo agreement exists Stable Continue Xanax  0.5 mg BID as needed -90-day prescription provided w/ refill.  - Patient has been  counseled on addictive properties of benzodiazepines as well as side effect profile in geriatric population.   - Controlled substance contract up-to-date - [place]  controlled substance database has been reviewed 10/18/23  Hyperlipidemia/statin intolerance/elevated blood pressure reading Encouraged her to increase her exercise and decrease her saturated fats.   We briefly discussed other options such as Zetia to help with her cholesterol and she is very hesitant to start any type of medication. Her blood pressure in the office today is borderline- again.   She has great deal of anxiety surrounding medication secondary to her multiple side effects to many meds.    Hypothyroidism: TSH reviewed in EMR 07/20/2023-normal 1.12 Continue Armour Thyroid  60 mg daily.   Foraminal stenosis of cervical region/Lumbar radiculopathy Stable Continue Robaxin  prescription for  her that she uses for flares of her pain only.  She reports this does not cause her increased sedation. She also has a compounded formula she uses topically.  She has this filled at [facility].  She will bring this prescription in to ensure proper refills are called in for her.  Cold sores: Stable Continue Valtrex  as needed  Reviewed expectations re: course of current medical issues. Discussed self-management of symptoms. Outlined signs and symptoms indicating need for more  acute intervention. Patient verbalized understanding and all questions were answered. Patient received an After-Visit Summary.   No orders of the defined types were placed in this encounter.   Meds ordered this encounter  Medications   ALPRAZolam  (XANAX ) 0.5 MG tablet    Sig: Take 1 tablet (0.5 mg total) by mouth 2 (two) times daily.    Dispense:  180 tablet    Refill:  1   methocarbamol  (ROBAXIN ) 500 MG tablet    Sig: Take 1 tablet (500 mg total) by mouth daily as needed for muscle spasms.    Dispense:  90 tablet    Refill:  1   valACYclovir  (VALTREX ) 1000 MG tablet    Sig: 2 tabs at onset of cold sore and repeat dose once 12 hours later.    Dispense:  8 tablet    Refill:  5    Note is dictated utilizing voice recognition software. Although note has been proof read prior to signing, occasional typographical errors still can be missed. If any questions arise, please do not hesitate to call for verification.   electronically signed by:  Napolean Backbone, DO  [facility] - [place]

## 2024-03-16 ENCOUNTER — Ambulatory Visit: Payer: Self-pay

## 2024-03-16 NOTE — Telephone Encounter (Signed)
 Copied from CRM [id number]. Topic: Clinical - Red Word Triage >> Mar 16, 2024  4:56 PM [place] J wrote: [facility] that prompted transfer to Nurse Triage: Patient has been having pain/pressure in her eyes, teeth, and neck. She has been having a constant headache for the past 2 weeks now and will only go away when she lays down to sleep. Answer Assessment - Initial Assessment Questions Idell from [facility] eye care called in regards to patient. Pt called them for having pressure in her eyes, neck, teeth, and headache for 2 weeks. Pt is not with Idell so Rn called pt to triage. Pt states that she is no longer a pt of [facility] and actually goes to [facility]. She states she's not sure why they called us  as the eye doctor instructed her to go to Urgent care. Pt was headed to urgent care as we were speaking. She was very grateful for the call though and asked if I could help tell her what the wait times were. RN attempted to find wait times but was unsuccessful.  Protocols used: [facility]

## 2024-05-01 ENCOUNTER — Ambulatory Visit: Admitting: Allergy and Immunology

## 2024-05-01 ENCOUNTER — Encounter: Payer: Self-pay | Admitting: Allergy and Immunology

## 2024-05-01 VITALS — BP 142/86 | HR 85 | Temp 98.7°F | Resp 16 | Ht 66.0 in | Wt 189.6 lb

## 2024-05-01 DIAGNOSIS — H4089 Other specified glaucoma: Secondary | ICD-10-CM | POA: Diagnosis not present

## 2024-05-01 DIAGNOSIS — T50905A Adverse effect of unspecified drugs, medicaments and biological substances, initial encounter: Secondary | ICD-10-CM | POA: Diagnosis not present

## 2024-05-01 DIAGNOSIS — I6522 Occlusion and stenosis of left carotid artery: Secondary | ICD-10-CM | POA: Diagnosis not present

## 2024-05-01 NOTE — Progress Notes (Unsigned)
 [place] - [place] - [place] - [place]   Dear No ref. provider found,  Thank you for referring Carol Norton to the [facility] [place] [facility]  on 05/01/2024.   Below is a summation of this patient's evaluation and recommendations.  Thank you for your referral. I will keep you informed about this patient's response to treatment.   If you have any questions please do not hesitate to contact me.   Sincerely,  Carol DOROTHA Denis, MD Allergy / Immunology [facility] [place]    ______________________________________________________________________    NEW PATIENT NOTE  Referring Provider: No ref. provider found Primary Provider: No primary care provider on file. Date of office visit: 05/01/2024    Subjective:   Chief Complaint:  Carol Norton (DOB: 03-May-1946) is a 78 y.o. female who presents to the clinic on 05/01/2024 with a chief complaint of Establish Care (She says she has issue with medication- Repatha injectable every 2 weeks. Dorzolamite made her headache, neck and teeth pain, SOB. ) .     HPI: Carol Norton presents to this clinic in evaluation of drug hypersensitivity.  I saw her in this clinic over 3 years ago for an issue with allergic rhinitis, ocular blepharoconjunctivitis, dry eye syndrome.  Her issue is that she is having lots of side effects with medications.  Apparently she had a very significant systemic reaction to olopatadine  affecting her gastrointestinal tract and just giving her a systemic reaction that went on for months until she discontinued this agent in March 2025.  She has a history of recurrent reactions to multiple medications and she has 2 main issues for which she must use medications at this point in time.  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  It is not enough for a surgeon to correct this lesion  but it is enough for her to be offered medications to treat cholesterol.  She has been offered Repatha but she is scared to death of using this medication because of the potential for side effects.  Her second issue is the fact that she has very significant glaucoma and she has failed use of multiple eyedrops for this problem because of localized and systemic side effects.  Past Medical History:  Diagnosis Date  . Allergic rhinitis   . Allergy   . Anxiety   . Arthritis   . Bilateral carpal tunnel syndrome    Patient scheduled for surgery with neurology  . Cancer (HCC)    skin cancer  . Cataract   . Chronic constipation   . Chronic sinusitis   . CN (constipation) 12/17/2014  . Complication of anesthesia    with nerve block had hard time taking a breath for a day after surgery wtih ketamine slow to wake up  . COVID-19 virus infection 05/2019  . GERD (gastroesophageal reflux disease)   . Glaucoma   . Glaucoma   . Hyperlipemia   . Hypothyroid   . Leg length discrepancy   . Ocular rosacea   . Plantar fasciitis   . Sebaceous cyst   . Sleep apnea    on CPAP  . Tear of lateral meniscus of knee 02/17/2019  . Urethral stenosis   . Vitamin D  deficiency     Past Surgical History:  Procedure Laterality Date  . ABDOMINAL HYSTERECTOMY  1983  . BREAST SURGERY  sebaceous cyst  . DENTAL SURGERY  11/10/2020  . EYE SURGERY     surgery with laser  . FACIAL COSMETIC SURGERY     1995  . GANGLION CYST EXCISION    . MENISCUS REPAIR Left   . REVERSE SHOULDER ARTHROPLASTY Right 05/20/2021   Procedure: REVERSE SHOULDER ARTHROPLASTY;  Surgeon: Cristy Bonner DASEN, MD;  Location: [facility];  Service: Orthopedics;  Laterality: Right;  . RHINOPLASTY    . right carpal tunnel     . right rotator cuff surgery     . ROTATOR CUFF REPAIR Right 01/06/2021  . SEPTOPLASTY    . SYNOVECTOMY Right 05/20/2021   Procedure: SYNOVECTOMY;  Surgeon: Cristy Bonner DASEN, MD;  Location: [facility];  Service: Orthopedics;   Laterality: Right;  . TUBAL LIGATION      Allergies as of 05/01/2024       Reactions   Amlodipine Shortness Of Breath, Swelling   Atenolol Other (See Comments)   Hair loss, insomnia   Synthroid  [levothyroxine Sodium ] Shortness Of Breath   Oxycodone-acetaminophen  Itching   Progesterone Other (See Comments)   Rapid heart rate   Accolate  [zafirlukast ]    Unknown reaction    Avelox [moxifloxacin Hcl In Nacl]    Increased pulse rate and bp   Citalopram    Unknown reaction   Dexilant [dexlansoprazole]    Unknown reaction    Escitalopram Oxalate    Couldn't function, lethargic    Meloxicam     No nsaids   Montelukast  Other (See Comments)   Nsaids    Hair loss, couldn't empty bladder    Olopatadine  Nausea And Vomiting   Restasis [cyclosporine]    Nasal/throat issues    Singulair  [montelukast  Sodium]    Aggression,anger   Statins    Myalgias, joint pain. Elevated bp and heart rate    Tramadol    Burning of mouth   Wellbutrin [bupropion] Hives   Dymista [azelastine-fluticasone]         Medication List    ALPRAZolam  0.5 MG tablet Commonly known as: XANAX  Take 1 tablet (0.5 mg total) by mouth 2 (two) times daily.   Armour Thyroid  60 MG tablet Generic drug: thyroid  Take one tablet daily   methocarbamol  500 MG tablet Commonly known as: ROBAXIN  Take 1 tablet (500 mg total) by mouth daily as needed for muscle spasms.   NON FORMULARY Apply 1 application topically 4 (four) times daily as needed. KETAMINE HCL/BACLOFEN/CYCLOBENZAPRINE HCL/DICLOFENAC SOD/GABAPENTIN /LIDOCAINE  HCL (TOPI)   PRESCRIPTION MEDICATION Place 1 drop into both eyes 3 (three) times daily as needed (dry eyes). Vital Tears compounded 30% autologous serum eye drops   sodium chloride  0.65 % Soln nasal spray Commonly known as: OCEAN Place 1 spray into both nostrils as needed for congestion.   valACYclovir  1000 MG tablet Commonly known as: VALTREX  2 tabs at onset of cold sore and repeat dose once 12  hours later.    Review of systems negative except as noted in HPI / PMHx or noted below:  Review of Systems  Constitutional: Negative.   HENT: Negative.    Eyes: Negative.   Respiratory: Negative.    Cardiovascular: Negative.   Gastrointestinal: Negative.   Genitourinary: Negative.   Musculoskeletal: Negative.   Skin: Negative.   Neurological: Negative.   Endo/Heme/Allergies: Negative.   Psychiatric/Behavioral: Negative.      Family History  Problem Relation Age of Onset  . Emphysema Father   . Alcohol abuse Father   . Allergies Mother   . Heart disease Mother   .  Ovarian cancer Mother   . Hyperlipidemia Mother   . Hypothyroidism Mother   . Arthritis Mother   . Hypothyroidism Sister   . Lung cancer Paternal Aunt   . Diabetes Maternal Grandmother   . Heart disease Maternal Grandmother   . Stroke Paternal Grandfather     Social History   Socioeconomic History  . Marital status: Married    Spouse name: Elsie  . Number of children: 2  . Years of education: 53  . Highest education level: 12th grade  Occupational History  . Occupation: retired  Tobacco Use  . Smoking status: Former    Current packs/day: 0.00    Average packs/day: 0.8 packs/day for 25.0 years (18.8 ttl pk-yrs)    Types: Cigarettes    Start date: 05/24/1982    Quit date: 05/25/2007    Years since quitting: 16.9  . Smokeless tobacco: Never  . Tobacco comments:    quit for 10 years in the total 27yrs.   Vaping Use  . Vaping status: Never Used  Substance and Sexual Activity  . Alcohol use: No    Comment: rare  . Drug use: No  . Sexual activity: Yes    Partners: Male    Birth control/protection: Surgical    Comment: Married  Other Topics Concern  . Not on file  Social History Narrative   Married to [place]. 2 children.   Dinks caffeine.   Patient is right handed.    Exercises routinely.   Wears seatbelt, smoke detector at home.   Feels safe in relationship.   Social Drivers of Manufacturing Engineer Strain: Low Risk (12/06/2023)   Received [facility]   Overall Financial Resource Strain (CARDIA)   . How hard is it for you to pay for the very basics like food, housing, medical care, and heating?: Not hard at all  Food Insecurity: No Food Insecurity (12/06/2023)   Received from [facility]   Hunger Vital Sign   . Within the past 12 months, you worried that your food would run out before you got the money to buy more.: Never true   . Within the past 12 months, the food you bought just didn't last and you didn't have money to get more.: Never true  Transportation Needs: No Transportation Needs (12/06/2023)   Received from [facility] - Transportation   . In the past 12 months, has lack of transportation kept you from medical appointments or from getting medications?: No   . In the past 12 months, has lack of transportation kept you from meetings, work, or from getting things needed for daily living?: No  Physical Activity: Sufficiently Active (12/06/2023)   Received from [facility]   Exercise Vital Sign   . On average, how many days per week do you engage in moderate to strenuous exercise (like a brisk walk)?: 4 days   . On average, how many minutes do you engage in exercise at this level?: 100 min  Stress: No Stress Concern Present (12/06/2023)   Received from [facility] - Occupational Stress Questionnaire   . Do you feel stress - tense, restless, nervous, or anxious, or unable to sleep at night because your mind is troubled all the time - these days?: Only a little  Social Connections: Socially Integrated (12/06/2023)   Received from [facility]   Social Network   . How would you rate your social network (family, work,  friends)?: Good participation with social networks  Intimate Partner Violence: Not At Risk (03/28/2024)   Received from [facility]   HITS   . Over the last 12 months how often  did your partner scream or curse at you?: Never   . Over the last 12 months how often did your partner threaten you with physical harm?: Never   . Over the last 12 months how often did your partner insult you or talk down to you?: Never   . Over the last 12 months how often did your partner physically hurt you?: Never    Environmental and Social history  Lives in a house with a dry environment, a dog bird located inside the household, no carpet in the bedroom, plastic on the bed, plastic on the pillow, and no smoking ongoing with inside the household.  Objective:   Vitals:   05/01/24 1344  BP: (!) 142/86  Pulse: 85  Resp: 16  Temp: 98.7 F (37.1 C)  SpO2: 98%   Height: 5' 6 (167.6 cm) Weight: 189 lb 9.6 oz (86 kg)  Physical Exam - deferred  Diagnostics: none   Assessment and Plan:    No diagnosis found.  There are no Patient Instructions on file for this visit.   Carol DOROTHA Denis, MD Allergy / Immunology [facility] [place] 

## 2024-05-02 ENCOUNTER — Encounter: Payer: Self-pay | Admitting: Allergy and Immunology

## 2024-05-08 ENCOUNTER — Ambulatory Visit: Admitting: Allergy and Immunology
